# Patient Record
Sex: Male | Born: 1940 | Race: White | Hispanic: No | Marital: Married | Smoking: Former smoker
Health system: Southern US, Community
[De-identification: ages and names within clinical notes are randomized; demographics above are authoritative.]

## PROBLEM LIST (undated history)

## (undated) DIAGNOSIS — I639 Cerebral infarction, unspecified: Secondary | ICD-10-CM

## (undated) DIAGNOSIS — E785 Hyperlipidemia, unspecified: Secondary | ICD-10-CM

## (undated) DIAGNOSIS — N401 Enlarged prostate with lower urinary tract symptoms: Secondary | ICD-10-CM

## (undated) DIAGNOSIS — I2510 Atherosclerotic heart disease of native coronary artery without angina pectoris: Secondary | ICD-10-CM

## (undated) DIAGNOSIS — Z8719 Personal history of other diseases of the digestive system: Secondary | ICD-10-CM

## (undated) DIAGNOSIS — G20 Parkinson's disease: Secondary | ICD-10-CM

## (undated) DIAGNOSIS — D649 Anemia, unspecified: Secondary | ICD-10-CM

## (undated) DIAGNOSIS — K219 Gastro-esophageal reflux disease without esophagitis: Secondary | ICD-10-CM

## (undated) DIAGNOSIS — G20A1 Parkinson's disease without dyskinesia, without mention of fluctuations: Secondary | ICD-10-CM

## (undated) DIAGNOSIS — I10 Essential (primary) hypertension: Secondary | ICD-10-CM

## (undated) HISTORY — PX: EYE SURGERY: SHX253

## (undated) HISTORY — PX: CHOLECYSTECTOMY: SHX55

## (undated) HISTORY — PX: CARDIAC CATHETERIZATION: SHX172

## (undated) HISTORY — PX: OTHER SURGICAL HISTORY: SHX169

---
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
## 1998-02-26 HISTORY — PX: OTHER SURGICAL HISTORY: SHX169

## 2000-02-27 HISTORY — PX: BACK SURGERY: SHX140

## 2003-02-27 ENCOUNTER — Other Ambulatory Visit: Payer: Self-pay

## 2003-02-28 ENCOUNTER — Other Ambulatory Visit: Payer: Self-pay

## 2003-03-01 ENCOUNTER — Other Ambulatory Visit: Payer: Self-pay

## 2005-07-25 ENCOUNTER — Ambulatory Visit: Payer: Self-pay | Admitting: Unknown Physician Specialty

## 2005-10-16 ENCOUNTER — Ambulatory Visit: Payer: Self-pay | Admitting: Family Medicine

## 2005-10-19 ENCOUNTER — Ambulatory Visit: Payer: Self-pay | Admitting: Surgery

## 2005-10-26 ENCOUNTER — Ambulatory Visit: Payer: Self-pay | Admitting: Surgery

## 2005-11-01 ENCOUNTER — Ambulatory Visit: Payer: Self-pay | Admitting: Surgery

## 2006-05-01 ENCOUNTER — Ambulatory Visit: Payer: Self-pay | Admitting: Unknown Physician Specialty

## 2006-05-16 ENCOUNTER — Ambulatory Visit: Payer: Self-pay | Admitting: Unknown Physician Specialty

## 2006-08-16 ENCOUNTER — Emergency Department: Payer: Self-pay | Admitting: Emergency Medicine

## 2006-09-11 ENCOUNTER — Ambulatory Visit: Payer: Self-pay | Admitting: Urology

## 2007-08-26 ENCOUNTER — Emergency Department: Payer: Self-pay | Admitting: Internal Medicine

## 2007-08-26 ENCOUNTER — Other Ambulatory Visit: Payer: Self-pay

## 2008-03-07 IMAGING — CR DG CHEST 2V
1 series · 2 of 2 positions shown · non-contrast
Comparison: none

REASON FOR EXAM: Hypertension
COMMENTS:

[Series 1: view not recorded · 0.17mm/px · 2 of 2 slices shown]
[im 1/2]
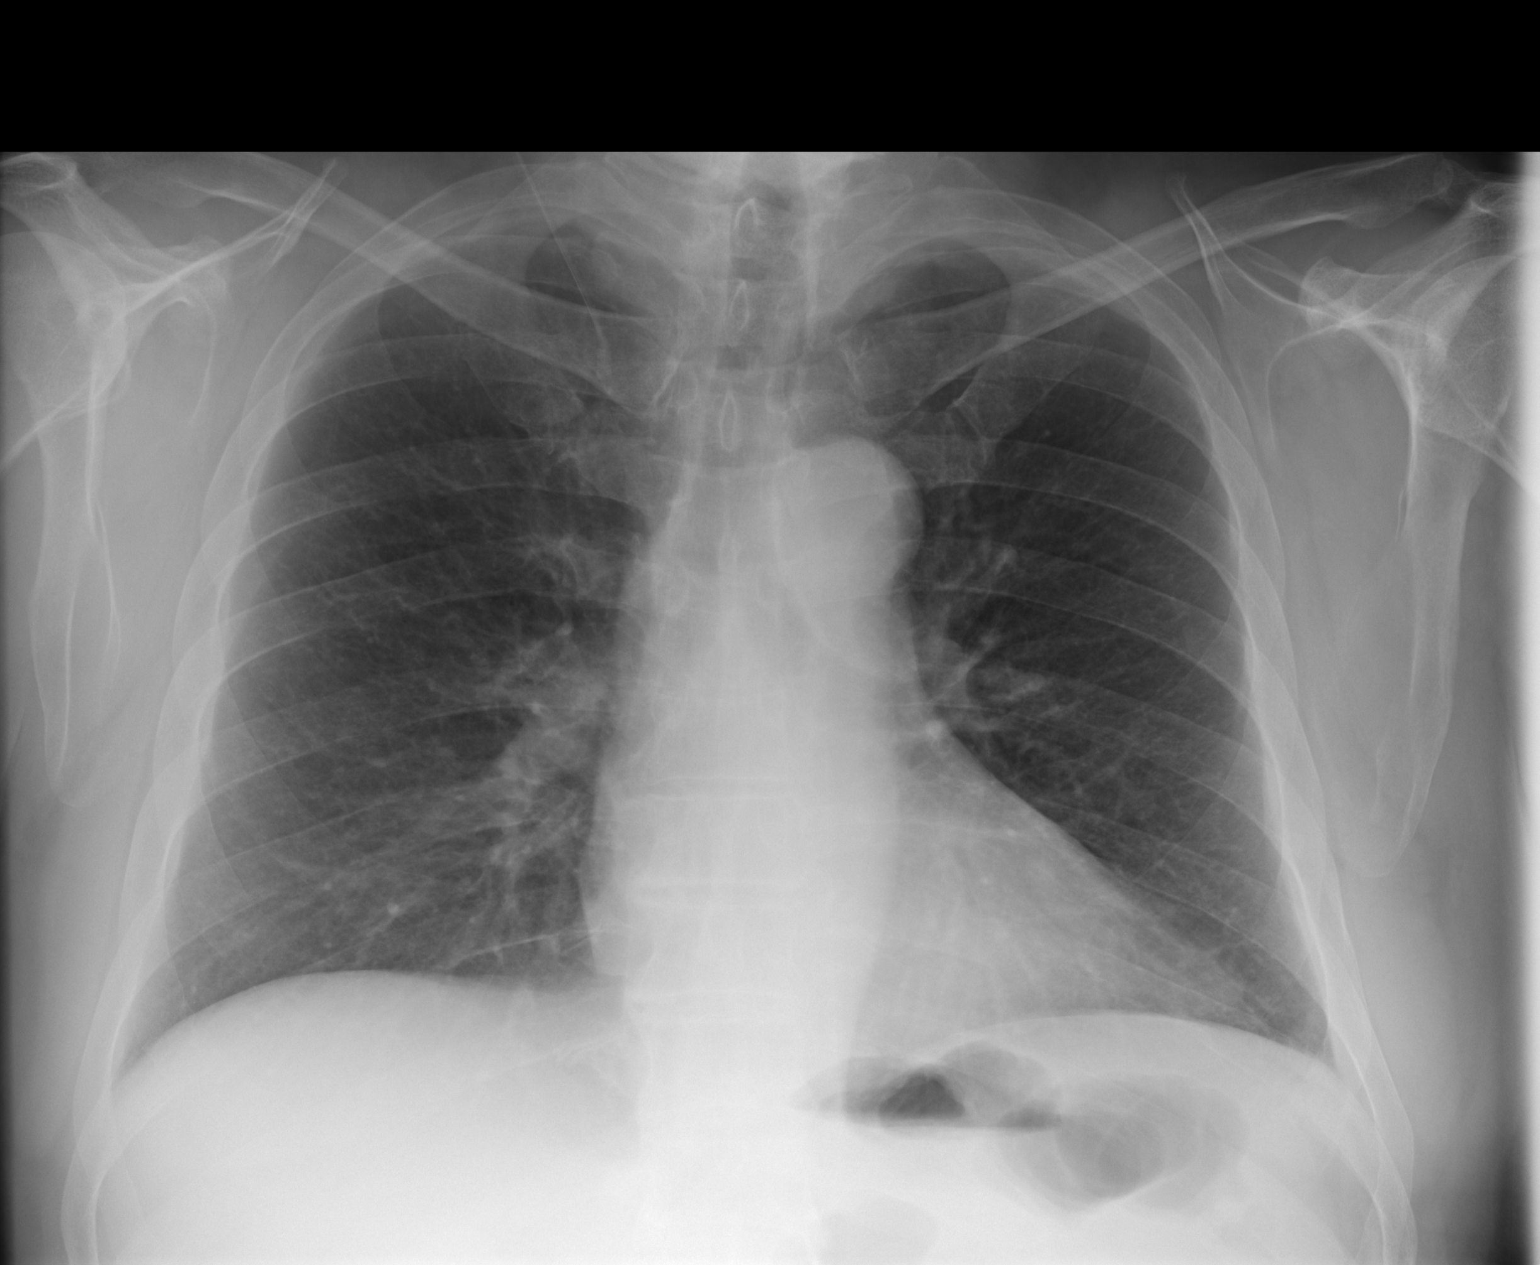
[im 2/2]
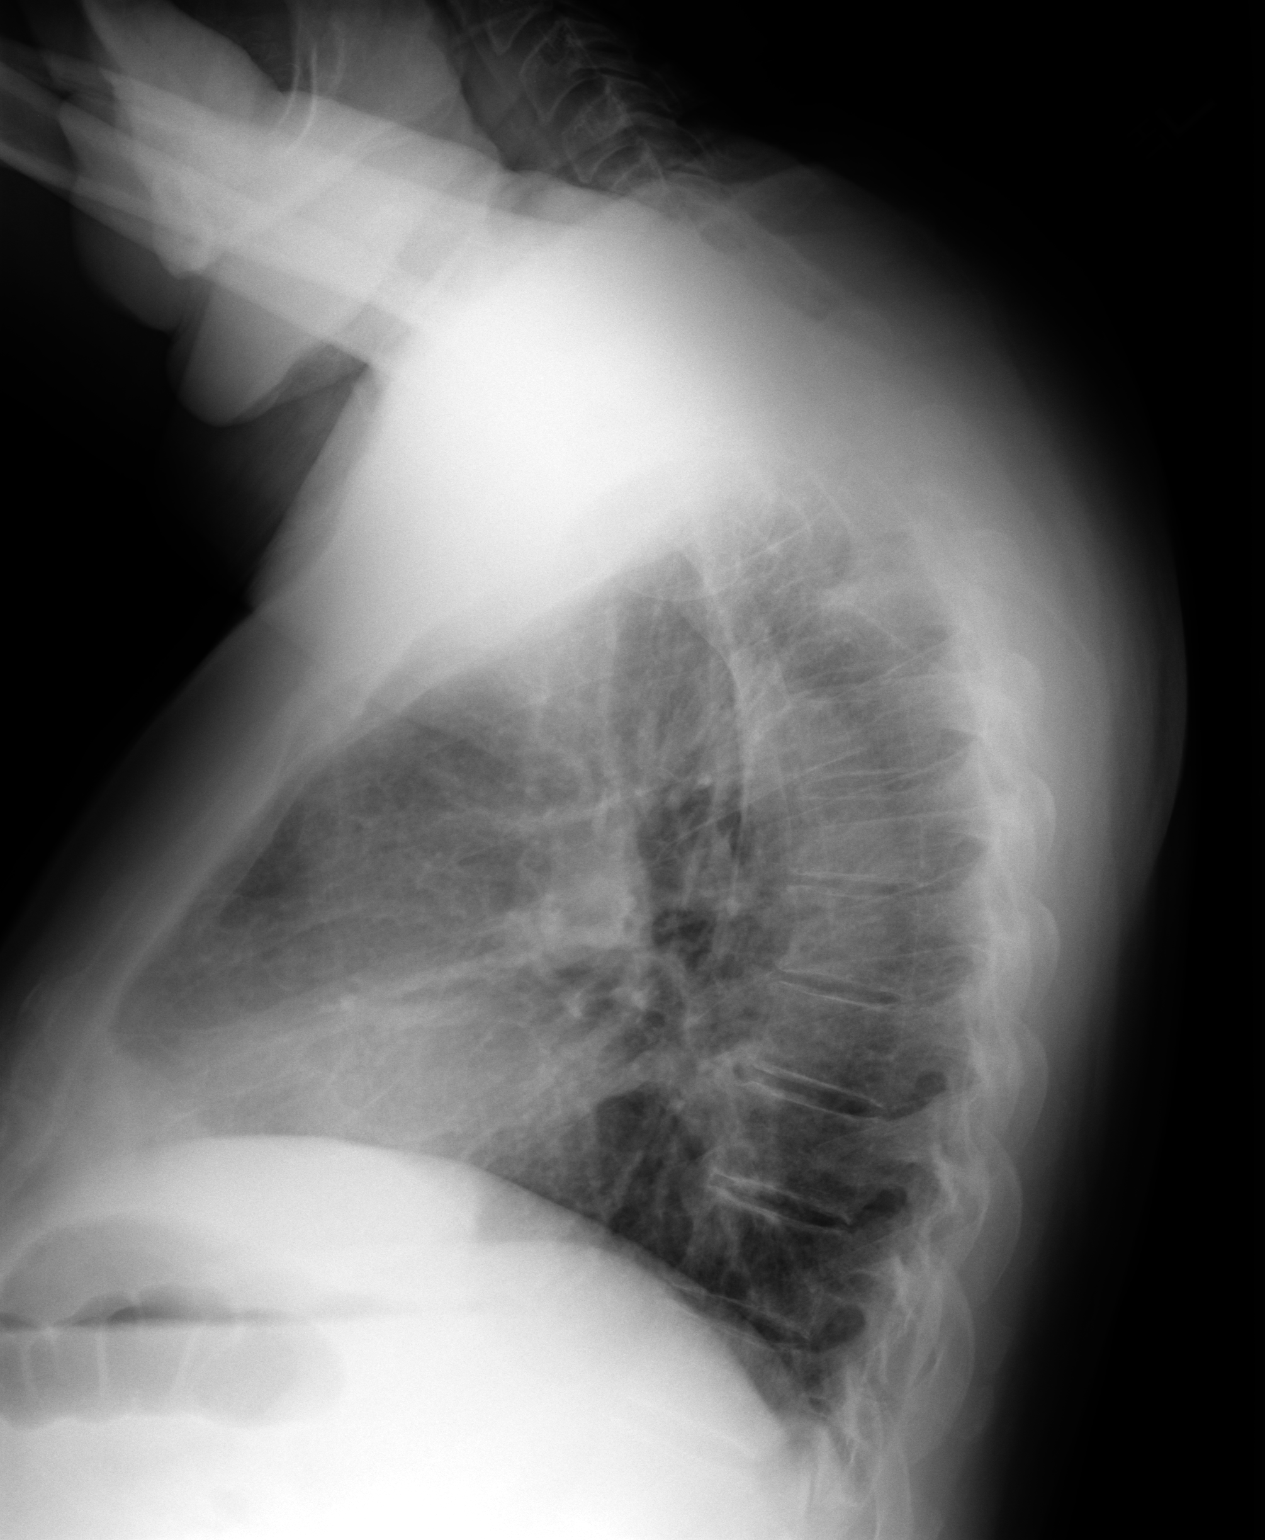

[2 of 2 positions shown; findings below may reference images not displayed]

PROCEDURE:     DXR - DXR CHEST PA (OR AP) AND LATERAL  - October 26, 2005  [DATE]

RESULT:     Two views of the chest show the presence of some minimal
atelectasis on the lateral view in the area of the RIGHT middle lobe or
lingula which is not definitely demonstrated on the PA view. I suspect this
is RIGHT middle lobe in location. The lungs are otherwise clear and fully
inflated. The bony structures are unremarkable. The cardiac silhouette is
normal.
IMPRESSION: Minimal basilar atelectasis or fibrosis presumably in the
RIGHT middle lobe.

## 2008-08-31 ENCOUNTER — Ambulatory Visit: Payer: Self-pay | Admitting: Urology

## 2009-01-18 ENCOUNTER — Observation Stay: Payer: Self-pay | Admitting: Internal Medicine

## 2009-04-25 ENCOUNTER — Ambulatory Visit: Payer: Self-pay | Admitting: Urology

## 2009-05-02 ENCOUNTER — Ambulatory Visit: Payer: Self-pay | Admitting: Urology

## 2010-12-11 ENCOUNTER — Ambulatory Visit: Payer: Self-pay | Admitting: Unknown Physician Specialty

## 2010-12-12 LAB — PATHOLOGY REPORT

## 2011-05-08 ENCOUNTER — Observation Stay: Payer: Self-pay | Admitting: Internal Medicine

## 2011-05-08 LAB — CBC
HCT: 53 % — ABNORMAL HIGH (ref 40.0–52.0)
HGB: 17.7 g/dL (ref 13.0–18.0)
MCH: 30.2 pg (ref 26.0–34.0)
MCHC: 33.5 g/dL (ref 32.0–36.0)
MCV: 90 fL (ref 80–100)
RBC: 5.88 10*6/uL (ref 4.40–5.90)

## 2011-05-08 LAB — COMPREHENSIVE METABOLIC PANEL
Albumin: 3.7 g/dL (ref 3.4–5.0)
Alkaline Phosphatase: 84 U/L (ref 50–136)
BUN: 35 mg/dL — ABNORMAL HIGH (ref 7–18)
Bilirubin,Total: 0.9 mg/dL (ref 0.2–1.0)
Calcium, Total: 8.3 mg/dL — ABNORMAL LOW (ref 8.5–10.1)
Creatinine: 1.42 mg/dL — ABNORMAL HIGH (ref 0.60–1.30)
SGOT(AST): 47 U/L — ABNORMAL HIGH (ref 15–37)
SGPT (ALT): 38 U/L
Total Protein: 8.1 g/dL (ref 6.4–8.2)

## 2011-05-08 LAB — TROPONIN I: Troponin-I: <0.02 ng/mL

## 2011-05-08 LAB — CK TOTAL AND CKMB (NOT AT ARMC): CK-MB: 0.6 ng/mL (ref 0.5–3.6)

## 2011-05-09 LAB — CK TOTAL AND CKMB (NOT AT ARMC)
CK, Total: 79 U/L (ref 35–232)
CK, Total: 79 U/L (ref 35–232)
CK-MB: 0.7 ng/mL (ref 0.5–3.6)

## 2011-05-09 LAB — URINALYSIS, COMPLETE
Bacteria: NONE SEEN
Bilirubin,UR: NEGATIVE
Blood: NEGATIVE
Glucose,UR: NEGATIVE mg/dL (ref 0–75)
Granular Cast: 4
Hyaline Cast: 1
Ketone: NEGATIVE
Specific Gravity: 1.026 (ref 1.003–1.030)
Squamous Epithelial: <1
WBC UR: 3 /HPF (ref 0–5)

## 2011-05-09 LAB — CBC WITH DIFFERENTIAL/PLATELET
Basophil #: 0 10*3/uL (ref 0.0–0.1)
Eosinophil #: 0.1 10*3/uL (ref 0.0–0.7)
Lymphocyte #: 1 10*3/uL (ref 1.0–3.6)
Lymphocyte %: 16.5 %
MCHC: 33.6 g/dL (ref 32.0–36.0)
MCV: 90 fL (ref 80–100)
Monocyte #: 1.1 10*3/uL — ABNORMAL HIGH (ref 0.0–0.7)
Monocyte %: 17.8 %
Neutrophil #: 4 10*3/uL (ref 1.4–6.5)
Platelet: 156 10*3/uL (ref 150–440)
RBC: 5.27 10*6/uL (ref 4.40–5.90)
RDW: 14 % (ref 11.5–14.5)
WBC: 6.2 10*3/uL (ref 3.8–10.6)

## 2011-05-09 LAB — BASIC METABOLIC PANEL
BUN: 35 mg/dL — ABNORMAL HIGH (ref 7–18)
Creatinine: 1.48 mg/dL — ABNORMAL HIGH (ref 0.60–1.30)
EGFR (Non-African Amer.): 50 — ABNORMAL LOW
Glucose: 113 mg/dL — ABNORMAL HIGH (ref 65–99)
Osmolality: 281 (ref 275–301)
Potassium: 3.8 mmol/L (ref 3.5–5.1)
Sodium: 136 mmol/L (ref 136–145)

## 2011-05-09 LAB — TROPONIN I: Troponin-I: <0.02 ng/mL

## 2011-05-09 LAB — CLOSTRIDIUM DIFFICILE BY PCR

## 2011-05-09 LAB — WBCS, STOOL

## 2011-05-10 LAB — BASIC METABOLIC PANEL
Anion Gap: 10 (ref 7–16)
BUN: 20 mg/dL — ABNORMAL HIGH (ref 7–18)
Calcium, Total: 7.6 mg/dL — ABNORMAL LOW (ref 8.5–10.1)
Chloride: 109 mmol/L — ABNORMAL HIGH (ref 98–107)
Co2: 19 mmol/L — ABNORMAL LOW (ref 21–32)
Glucose: 87 mg/dL (ref 65–99)
Potassium: 3.9 mmol/L (ref 3.5–5.1)
Sodium: 138 mmol/L (ref 136–145)

## 2011-09-03 ENCOUNTER — Ambulatory Visit: Payer: Self-pay | Admitting: Gastroenterology

## 2012-04-06 ENCOUNTER — Inpatient Hospital Stay: Payer: Self-pay | Admitting: Internal Medicine

## 2012-04-06 ENCOUNTER — Ambulatory Visit: Payer: Self-pay | Admitting: Neurology

## 2012-04-06 LAB — COMPREHENSIVE METABOLIC PANEL
Albumin: 3.5 g/dL (ref 3.4–5.0)
Alkaline Phosphatase: 87 U/L (ref 50–136)
Anion Gap: 6 — ABNORMAL LOW (ref 7–16)
BUN: 14 mg/dL (ref 7–18)
Calcium, Total: 8.1 mg/dL — ABNORMAL LOW (ref 8.5–10.1)
Co2: 27 mmol/L (ref 21–32)
Creatinine: 1.07 mg/dL (ref 0.60–1.30)
EGFR (African American): >60
Glucose: 89 mg/dL (ref 65–99)
Osmolality: 279 (ref 275–301)
Potassium: 3.6 mmol/L (ref 3.5–5.1)
SGOT(AST): 23 U/L (ref 15–37)
SGPT (ALT): 29 U/L (ref 12–78)

## 2012-04-06 LAB — CK TOTAL AND CKMB (NOT AT ARMC): CK-MB: 0.9 ng/mL (ref 0.5–3.6)

## 2012-04-06 LAB — CBC
HCT: 46.4 % (ref 40.0–52.0)
HGB: 15.8 g/dL (ref 13.0–18.0)
MCH: 30.1 pg (ref 26.0–34.0)
MCHC: 34.1 g/dL (ref 32.0–36.0)
MCV: 88 fL (ref 80–100)
Platelet: 168 10*3/uL (ref 150–440)
RDW: 13.9 % (ref 11.5–14.5)
WBC: 6.3 10*3/uL (ref 3.8–10.6)

## 2012-04-06 LAB — URINALYSIS, COMPLETE
Blood: NEGATIVE
Glucose,UR: NEGATIVE mg/dL (ref 0–75)
Ketone: NEGATIVE
Nitrite: NEGATIVE
Ph: 7 (ref 4.5–8.0)
Protein: NEGATIVE
Specific Gravity: 1.01 (ref 1.003–1.030)
WBC UR: 1 /HPF (ref 0–5)

## 2012-04-07 LAB — CBC WITH DIFFERENTIAL/PLATELET
Basophil #: 0 10*3/uL (ref 0.0–0.1)
Basophil %: 0.3 %
Eosinophil #: 0.1 10*3/uL (ref 0.0–0.7)
HCT: 43.9 % (ref 40.0–52.0)
Lymphocyte #: 1.4 10*3/uL (ref 1.0–3.6)
Lymphocyte %: 22.4 %
MCH: 29.3 pg (ref 26.0–34.0)
MCHC: 32.9 g/dL (ref 32.0–36.0)
Monocyte #: 0.8 x10 3/mm (ref 0.2–1.0)
Neutrophil #: 3.9 10*3/uL (ref 1.4–6.5)
WBC: 6.2 10*3/uL (ref 3.8–10.6)

## 2012-04-07 LAB — COMPREHENSIVE METABOLIC PANEL
Albumin: 3.2 g/dL — ABNORMAL LOW (ref 3.4–5.0)
Anion Gap: 6 — ABNORMAL LOW (ref 7–16)
Bilirubin,Total: 0.8 mg/dL (ref 0.2–1.0)
Chloride: 108 mmol/L — ABNORMAL HIGH (ref 98–107)
Co2: 25 mmol/L (ref 21–32)
Creatinine: 1.06 mg/dL (ref 0.60–1.30)
EGFR (Non-African Amer.): >60
SGOT(AST): 24 U/L (ref 15–37)

## 2012-04-07 LAB — PHENYTOIN LEVEL, TOTAL: Dilantin: 10.7 ug/mL (ref 10.0–20.0)

## 2012-04-09 LAB — BASIC METABOLIC PANEL
Anion Gap: 6 — ABNORMAL LOW (ref 7–16)
BUN: 13 mg/dL (ref 7–18)
Calcium, Total: 8.1 mg/dL — ABNORMAL LOW (ref 8.5–10.1)
Creatinine: 0.99 mg/dL (ref 0.60–1.30)
EGFR (African American): >60
EGFR (Non-African Amer.): >60
Glucose: 84 mg/dL (ref 65–99)
Osmolality: 279 (ref 275–301)
Potassium: 3.8 mmol/L (ref 3.5–5.1)
Sodium: 140 mmol/L (ref 136–145)

## 2012-04-09 LAB — PHENYTOIN LEVEL, TOTAL: Dilantin: 8.1 ug/mL — ABNORMAL LOW (ref 10.0–20.0)

## 2012-04-09 LAB — CBC WITH DIFFERENTIAL/PLATELET
Basophil #: 0 10*3/uL (ref 0.0–0.1)
Basophil %: 0.6 %
Eosinophil %: 2.6 %
HCT: 44.3 % (ref 40.0–52.0)
HGB: 14.7 g/dL (ref 13.0–18.0)
Lymphocyte #: 1.2 10*3/uL (ref 1.0–3.6)
MCH: 29.8 pg (ref 26.0–34.0)
MCHC: 33.2 g/dL (ref 32.0–36.0)
Monocyte #: 0.7 x10 3/mm (ref 0.2–1.0)
Neutrophil #: 5.1 10*3/uL (ref 1.4–6.5)
RBC: 4.93 10*6/uL (ref 4.40–5.90)
RDW: 14.2 % (ref 11.5–14.5)

## 2012-04-12 DIAGNOSIS — R569 Unspecified convulsions: Secondary | ICD-10-CM | POA: Insufficient documentation

## 2012-04-12 LAB — CULTURE, BLOOD (SINGLE)

## 2012-04-23 ENCOUNTER — Ambulatory Visit: Payer: Self-pay | Admitting: Ophthalmology

## 2013-05-06 ENCOUNTER — Ambulatory Visit: Payer: Self-pay | Admitting: Ophthalmology

## 2014-03-24 ENCOUNTER — Ambulatory Visit: Payer: Self-pay | Admitting: Internal Medicine

## 2014-06-18 NOTE — Consult Note (Signed)
PATIENT NAME:  Jeffery Strong, [name]#:  [id number] DATE OF BIRTH:  11-05-1940  DATE OF CONSULTATION:  04/06/2012  CONSULTING PHYSICIAN:  Pauletta BrownsYuriy Dionisio Aragones, MD  REASON FOR CONSULTATION: ER consultation for altered mental status, questionable seizures.   HISTORY OF PRESENT ILLNESS: This is 74 year old male with only significant past medical history of hypertension, possible hyperlipidemia, coronary artery disease, status post stenting, ? hx of Parkinson's dz comes into the Emergency Room with transient periods of confusion. Information is obtained from a family member, who is his wife who is at bedside. She states that the patient in church started having these frequent episodes of right upper extremity shaking with transient periods of confusion, disorientation and not able to expressive herself consistent with expressive aphasia.  The symptoms come and go and are associated with right upper extremity shaking.  This is the first time of such symptoms. No reported prior history of stroke.  No reported prior history of seizure. No tongue biting, no urinary incontinence at this point. When I was in the room I noticed forced field deviation to R side and RUE shaking.   ROS: General: fatigue, confusion.  HEENT: headache REsp: no SOB CV: No CP GI: no diarhea or constipation Heme: no cold/heat intolerance.   PAST MEDICAL HISTORY: As described above, hypertension, hyperlipidemia, and coronary artery disease and ? hx of Parksions dz  MEDICATIONS:  As per family, he is on aspirin 325 a day.   REVIEW OF SYSTEMS: Cannot be obtained at this time as the patient is not able to express himself.   CURRENT NEUROLOGICAL EXAMINATION:  MENTAL STATUS: The patient is awake, appears to have expressive aphasia, not able to tell me day or time.  CRANIAL NERVE EXAMINATION: Extraocular movements appear to be intact. Facial sense, facial motor appear to be intact. Unable to assess all the cranial nerves.  The patient withdraws  from painful stimuli bilateral symmetrically. Reflexes appeared to be symmetrical bilaterally.   IMPRESSION: The patient is a 74 year old male with past medical history as above, appears to have partial seizures. He appears to be in partial status with occasional right field deviation of his eyes to the right as well as shaking of the right upper extremity, and that is associated with expressive aphasia as most likely the focus of the seizures is Broca's left frontal lobe area. The case was discussed with the Emergency Department. At this time, load the patient with Keppra 1 gram as well as start 1 gram b.i.d. as well as load with Dilantin 20mg /kg.  He is about 100 kilos, so 2 grams now, start him on dilantin 100 mg t.i.d. Monitor closely.  If the patient continues to be shaking and is suspected to have seizure activity, I would start him on benzodiazepine drip.  At that time, the patient will need to be intubated.  I agree with admitting the patient to the ICU for observation.  Unfortunately, at this time we are unable to obtain EEG monitoring but would treat as the patient is in status. MRI of the brain when possible with and without contrast.   - EEG AM - sz precaustions -MRI Brain w/ wout gad in AM - Anti epileptics as above.  -Obtain diantin level in AM - If suspect sz activity, pt suddenly has change in mental status with negative CT [facility].   ____________________________ Pauletta BrownsYuriy Bodie Abernethy, MD yz:cb D: 04/06/2012 13:47:50 ET T: 04/06/2012 14:46:24 ET JOB#: [id number]  cc: Pauletta Browns, MD, <Dictator> Pauletta Browns MD ELECTRONICALLY SIGNED 04/06/2012 18:23

## 2014-06-18 NOTE — Discharge Summary (Signed)
PATIENT NAME:  Jeffery Strong, Jeffery Strong MR#:  [id number] DATE OF BIRTH:  March 16, 1940  DATE OF ADMISSION:  04/06/2012 DATE OF DISCHARGE: 04/10/2012   ADMITTING PHYSICIAN: Duane Lope. Judithann Sheen, MD  DISCHARGING PHYSICIAN: Enid Baas, MD  PRIMARY CARE PHYSICIAN: Jorje Guild. Beckey Downing, MD  CONSULTATIONS IN THE HOSPITAL: Neurology consultation by Theora Master.  FINAL DISCHARGE DIAGNOSES:  1. Status epilepticus.  2. Ongoing partial complex seizures.  3. Parkinson's disease.  4. Hypertension.  5. Coronary artery disease.  6. Hyperlipidemia.   DISCHARGE MEDICATIONS:  1. Azilect 1 mg p.o. daily.  2. Aspirin 3 mg p.o. daily.  3. Atorvastatin 20 mg p.o. daily.  4. Xanax 0.25 mg twice a week as needed for anxiety.  5. Fosphenytoin 200 mg IV t.i.d.   6. Keppra 1 gram IV q.12 hours.  7. Ativan 1 to 2 mg IV q.4 hours as needed for seizures.   DISCHARGE DIET: Low-sodium diet.   DISCHARGE ACTIVITY: As tolerated.    FOLLOWUP INSTRUCTIONS: The patient is being transferred to [facility].   LABS AND IMAGING STUDIES: At this time:  1. WBC 7.2, hemoglobin 14.7, hematocrit 44.3, platelet count 150.  2. Sodium 140, potassium 3.8, chloride 110, bicarbonate 24, BUN 13, creatinine 0.99, glucose 84 and calcium of 8.1.  3. Serum Dilantin level was 8.1, after which the Dilantin bolus was given and dose increased. 4. MRI of the brain from 04/07/2012: This is MRI with and without contrast, and it is showing changes of atrophy and chronic microvascular ischemic disease, no acute intracranial abnormality is done.  5. Ultrasound of carotids bilaterally showing no evidence of hemodynamically significant stenosis. 6. Blood cultures done on admission on 04/06/2012 are negative.  7. Urinalysis negative for any infection.  8. CT of head done on admission showing no acute intracranial process.   BRIEF HOSPITAL COURSE: The patient is a very pleasant 74 year old Caucasian male with past medical history significant for Parkinson's  disease, coronary artery disease, hypertension and hyperlipidemia, who was [place] in from church secondary to recurrent episodes of near-syncope with aphasia, after which he was confused. He was seen by neurologist on call and was admitted initially for status epilepticus and possible ongoing seizures.  Status epilepticus with possible  ongoing seizure. He was initially admitted to CCU for possible ongoing seizures. Was loaded with Dilantin, and then Keppra was added. EEG was negative for any ictal or interictal activity. Carotid ultrasound and CT of head were negative. MRI was done with and without contrast, which did not show any acute intracranial abnormalities. He was gradually moved out of CCU, but the patient continued to remain confused, which was thought to be possible postictal confusion. He was on Azilect for several years for his Parkinson's disease, which could reduce the seizure threshold, so the Azilect was held in the hospital for 1 day. The patient continued to have 1- to 3-minute episodes of upper body jerking, mostly his right arm, followed by confusion. During these episodes, the patient has like stared look, and he is confused after the episode is halted with Ativan. Then, it was taking a few hours before he could get back to normal. Over the last 24 hours, the patient almost had 6 to 7 episodes of this, of which 3 of them required Ativan as they have lasted more than 2 minutes. This morning, the patient alert and oriented, but according to wife, he still is having episodes of confusion. At this time, because our facility lacks ongoing continuous neurological care and also our facility  lacks continuous neurological monitoring and also no 24-hour EEG availability, I spoke with Dr. Luvenia HellerMichael Wang, neurologist at [facility], who has kindly accepted transfer of the patient over there for further monitoring or further testing. His heart disease and hypertension are stable. His vitals have been stable. His  labs as of 04/09/2012 have been dictated above.   DISCHARGE CONDITION: Guarded.   DISCHARGE DISPOSITION: To [facility].   TIME SPENT ON DISCHARGE: 40 minutes.    ____________________________ Enid Baasadhika Ab Leaming, MD rk:OSi D: 04/10/2012 13:12:20 ET T: 04/10/2012 13:55:15 ET JOB#: [id number]  cc: Enid Baasadhika Doil Kamara, MD, <Dictator> Enid BaasADHIKA  Shellhammer MD ELECTRONICALLY SIGNED 04/24/2012 15:18

## 2014-06-18 NOTE — Discharge Summary (Signed)
PATIENT NAME:  Jeffery Strong, [name] MR#:  [id number] DATE OF BIRTH:  [birth year]  DATE OF ADMISSION:  04/06/2012 DATE OF DISCHARGE:  04/11/2012  ADDENDUM  ADMITTING PHYSICIAN: Dr. Aram BeechamJeffrey Sparks.   DISCHARGING PHYSICIAN: Enid Baasadhika Quay Simkin, MD  For more details, please look at the discharge summary done by Dr. Nemiah CommanderKalisetti on 04/10/2012.   ADDENDUM: The [facility]10/2012 but because of extremely bad weather, the EMS was not able to transport him on February 13th so the [facility] is [facility]. The patient did have at least 3 to 4 seizures, generalized tonic-clonic seizures, lasting greater than 3 minutes requiring 2 to 4 mg of IV Ativan this morning and also last night. [facility] as we cannot provide continuous EEG monitoring and do not have full time Neurology services available at this hospital.  ADDITIONAL TIME SPENT: 35 minutes on coordinating the discharge today.    ____________________________ Enid Baasadhika Ketra Duchesne, MD rk:jm D: 04/11/2012 16:05:58 ET T: 04/12/2012 10:44:36 ET JOB#: [id number]  cc: Enid Baasadhika Lashia Niese, MD, <Dictator> Enid BaasADHIKA Neeley Sedivy MD ELECTRONICALLY SIGNED 04/23/2012 15:58

## 2014-06-18 NOTE — Consult Note (Signed)
Referring Physician:  Idelle Crouch :   Primary Care Physician:  Darol Destine, [address], [place], [place] [address], [place] : [facility], [address] [place],  [address], [place] [phone]  Reason for Consult: Admit Date: 06-Apr-2012  Chief Complaint: Seizures  Reason for Consult: seizure   History of Present Illness: History of Present Illness:   HISTORY OF PRESENT ILLNESS: man with a history of PD, HTN, HLP and CAD was admitted to [facility] after having recurrent episodes of altered mental status and aphasia while at church on Feb 9th.  The patient was thought to be in simple partial status with repetitive shaking and eye deviation noted.  He was given IV ativan, fosphenytoin and Keppra in the ED and his symptoms resolved.  There have been no seizures since that time.  No side effects on the AEDs.  The symptoms came on abruptly without warning.  Only the medication made them better.  They were severe when they were present.  Nothing in particular brought them on.  EEG was negative for ictal vs interictal activity.  HCT was unremarkable.  Carotid ultrasound unremarkable.  The patient's mental status has improved and he is now alert but he is feeling generally weak.  His PD has been under adequate control with his Azilect.  says that he continues to act abnormally though.  He was confusing some family members for other family members.  He has been uncharacteristically abrasive and irascible.  He is refusing to speak with some providers and he got upset with a nurse earlier resulting in a full time sitter.  Per family this is all abnormal behavior for him.  He has been afebrile. MEDICAL HISTORY: 1. Parkinson's disease.  ASCVD, status post PTCA with stent placement.  BPH.  History of prostatitis.  Benign hypertension.  Hyperlipidemia.  Previous back surgery.  Status post cholecystectomy.   1. Toprol-XL 50 mg p.o. daily.   Azilect 1 mg p.o. daily.  Lipitor 20 mg p.o. at bedtime.  Aspirin 325 mg p.o. daily.  Xanax 0.25 mg p.o. q. 6 hours as needed for anxiety.  No known drug allergies.  HISTORY: The patient has a remote history of tobacco abuse but none recently. Denies alcohol abuse.  HISTORY: Positive for diabetes, hypertension, stroke, and coronary artery disease. Negative for prostate or colon cancer.   GENERAL: Uncooperative.  Says he doesnt want to be examined, despite explaining that it is very important for his health.  Alert and speaks in complete sentences says he "doesn't want to be bothered".   Patient refuses this portion of the exam.  Patient refuses this portion of the exam. - Normal- Patient refuses this portion of the exam. Pronator Drift - Patient refuses this portion of the exam. Ambulation - Patient refuses this portion of the exam. Romberg - Patient refuses this portion of the exam.- Patient refuses this portion of the exam. STATUS: Patient refuses this portion of the exam.  He reluctantly says that he's from [facility] which is correct. NERVES: Patient refuses this portion of the exam.  Patient refuses this portion of the exam.  Patient refuses this portion of the exam.  Patient refuses this portion of the exam.  74 year old man with a history of PD, HTN, HLP and CAD was admitted to [facility] after having recurrent episodes of altered mental status and aphasia while at church on Feb 9th.  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  Based on this, I would recommend that he stay on the Keppra 1g twice a day if no adverse effects are noted.  It is uncertain if his behavior change is the result of the Keppra or post ictal psychosis.  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  He should be taken off of the dilantin.  He should have a 72 hour ambulatory EEG as an outpatient to  evaluate for ictal vs interictal activity to try to better characterize and localize his possible seizure disorder.  It should be noted that in patients with epilpesy, a routine EEG is only positive 40% of the time.  I have personally viewed his HCT which is unremarkable.  I have personally viewed his EEG which is negative for interictal activity.  I would not recommend a repeat HCT or MRI at this point given that he does not have any definite focal deficits on exam although my interacdtion as outlined above has been limited.  He should be monitored on telemetry to rule out afib.  His EKG was unremarkable.  Carotid ultrasound was negative for carotid artery stenosis.  He should stay on ASA 325 mg daily.    With regards to his parkinsons disease, I have noted that he is on Azilect which occasionally in patients with predisposition, can lower the seizure threshold, although I do not think this medication "caused" his seizure it could have in part contributed, although this is not certain.  Since he has been tolerating the medication for a long time, I would not recommend stopping it at this point unless his mood does not improve spontaneously.  At this point, I would give him another 24 hours or so to see if his post ictal psychosis resolves.   will note that the family is very concerned about his new event and change in behavior and they are wondering if this may represent ongoing seizures.  I have explained to them that post ictal psychosis is common and he appears to be experiencing this.  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  I have explained to the family that I cannot 100% rule out ongoing subclinical seizures and that if they feel the need to pursue this level of  coverage then they may require transfer to a tertiary facility with this equipment like [facility] or [facility].  I have explained that I do not particularly think this is necessary at this point, as I think his psychosis will wear off over the next 24-48 hours but I can understand their concern given the recent seizures and postictal personality change are big changes from his baseline and are certainly very anxiety provoking in the family.    Also, we have discussed that fMRI is not indicated for PD and is considered experimental for diagnostic purposes.  I have explained that this is a test that would not be performed on an inpatient, especially an inpatient with clear-cut, clinically diagnosed PD. Melrose Nakayama, MD    ROS:  General denies complaints   HEENT no complaints   Lungs no complaints   Cardiac no complaints   GI no complaints   GU no complaints   Musculoskeletal no complaints   Extremities  no complaints   Skin no complaints   Endocrine no complaints   Past Medical/Surgical Hx:  Parkinson's Disease:   postratisis:   HTN:   High cholesterol:   GB Removed:   Back surgery:   Cardiac Stent:   Home Medications: Medication Instructions Last Modified Date/Time  Azilect 1 mg oral tablet 1 tab(s) orally once a day 09-Feb-14 15:09  aspirin 325 mg oral tablet 1 tab(s) orally once a day 09-Feb-14 15:09  atorvastatin 20 mg oral tablet 1 tab(s) orally once a day (at bedtime) 09-Feb-14 15:09  Metoprolol Succinate ER 50 mg oral tablet, extended release 1 tab(s) orally once a day 09-Feb-14 15:09  alprazolam 0.25 mg oral tablet 1 tab(s) orally 2 times a week as needed for anxiety, nervousness.  09-Feb-14 15:09   [facility] Current Meds:  Sodium Chloride 0.9%, 1000 ml at 100 ml/hr  Aspirin Enteric Coated tablet, ( Ecotrin)  325 mg Oral daily  - Indication: Pain/Fever/Thromboembolic Disorders/Post MI/Prophylaxis MI  Instructions:  Initiate Bleeding Precautions Protocol--DO NOT  CRUSH  atorvaSTATin tablet, 20 mg Oral at bedtime  - Indication: Hypercholesterolemia  meTOProlol succinate ER tablet, ( Toprol XL)  50 mg Oral daily  - Indication: Antihypertensive/ Angina  Instructions:  - DO NOT CRUSH  Non-Formulary Medication, Azilect  1 mg Oral daily  Instructions:  pt may take own supply.Marland KitchenMarland KitchenPatient's Own Med  LORazepam injection,  ( Ativan injection )  1 to 2 mg, IV push, q4h PRN for agitation  Indication: Anxiety/ Seizure/ Antiemetic Adjunct/ Preop Sedation  Acetaminophen * tablet, ( Tylenol (325 mg) tablet)  650 mg Oral q4h PRN for pain or temp. greater than 100.4  - Indication: Pain/Fever  Pantoprazole injection, ( Protonix injection )  40 mg, IV push, q12h  Indication: Erosive Esophagitis/ GERD, 1. Reconstitute Protonix with 75m of Normal Saline; concentration= 429mmL. Once reconstituted; Protonix must be administered within 30 minutes.  2. I.V. line must be flushed before and after administration. Administer [id number].V. push over 2-3 minutes.  Enoxaparin injection,  ( Lovenox injection )  40 mg, Subcutaneous, daily  Indication: Prophylaxis or treatment of thromboembolic disorders, Monitor Anticoags per hospital protocol  Fosphenytoin injection, ( CereBYX injection )  100 mg/pe, IV push, tid  Indication: Seizures, Dilute each 100 mg PE (2 ml) dose in 2 ml D5W or NS and administer at 100 mg PE per minute.  levETIRAcetam injection,  ( Keppra injection )  1000 mg in Dextrose 5% 100 ml, IV Piggyback, q12h, Infuse over 15 minute(s), Mixed in 100 ml given over 15 minutes.  Allergies:  NKDA: None  Vital Signs: **Vital Signs.:   11-Feb-14 00:00  Vital Signs Type Routine  Pulse Pulse 76  Respirations Respirations 26  Pulse Ox % Pulse Ox % 96  Pulse Ox Activity Level  At rest  Oxygen Delivery Room Air/ 21 %  Pulse Ox Heart Rate 76    01:00  Pulse Pulse 62  Respirations Respirations 37  Systolic BP Systolic BP 129 001astolic BP (mmHg) Diastolic BP  (mmHg) 80  Mean BP 96  Pulse Ox % Pulse Ox % 92  Oxygen Delivery Room Air/ 21 %  Pulse Ox Heart Rate 70    03:13  Vital Signs Type Routine  Pulse Pulse 62  Respirations Respirations 51  Systolic BP Systolic BP 95  Diastolic BP (mmHg) Diastolic BP (mmHg) 61  Mean BP 72  Pulse Ox % Pulse Ox % 95  Pulse Ox Activity Level  At rest  Oxygen Delivery  Room Air/ 21 %    06:00  Vital Signs Type Routine  Temperature Temperature (F) 98.8  Celsius 37.1  Pulse Pulse 62  Pulse Ox % Pulse Ox % 99  Oxygen Delivery Room Air/ 21 %  Pulse Ox Heart Rate 62    07:19  Vital Signs Type POCT  Nurse Fingerstick (mg/dL) FSBS (fasting range 65-99 mg/dL) 133  Comments/Interventions  Nurse Notified    07:35  Vital Signs Type Routine  Temperature Temperature (F) 97.7  Celsius 36.5  Temperature Source oral  Pulse Pulse 61  Respirations Respirations 20  Systolic BP Systolic BP 85  Diastolic BP (mmHg) Diastolic BP (mmHg) 52  Mean BP 63  Pulse Ox % Pulse Ox % 97  Pulse Ox Activity Level  At rest  Oxygen Delivery Room Air/ 21 %  Pulse Ox Heart Rate 64    08:00  Vital Signs Type Routine  Pulse Pulse 56  Respirations Respirations 20  Systolic BP Systolic BP 64  Diastolic BP (mmHg) Diastolic BP (mmHg) 38  Mean BP 46  Pulse Ox % Pulse Ox % 100  Pulse Ox Activity Level  At rest  Oxygen Delivery Room Air/ 21 %  Pulse Ox Heart Rate 58    08:35  Vital Signs Type Routine  Temperature Source oral  Pulse Pulse 58  Systolic BP Systolic BP 92  Diastolic BP (mmHg) Diastolic BP (mmHg) 59  Mean BP 70  Pulse Ox % Pulse Ox % 94  Oxygen Delivery Room Air/ 21 %  Pulse Ox Heart Rate 56    09:00  Vital Signs Type Routine  Pulse Pulse 56  Systolic BP Systolic BP 93  Diastolic BP (mmHg) Diastolic BP (mmHg) 70  Mean BP 77  Pulse Ox % Pulse Ox % 100  Pulse Ox Activity Level  At rest  Oxygen Delivery Room Air/ 21 %  Pulse Ox Heart Rate 58    09:36  Vital Signs Type Routine  Pulse Pulse 58  Systolic BP  Systolic BP 403  Diastolic BP (mmHg) Diastolic BP (mmHg) 70  Mean BP 90  Pulse Ox % Pulse Ox % 99  Pulse Ox Activity Level  At rest  Oxygen Delivery Room Air/ 21 %  Pulse Ox Heart Rate 60   Lab Results:  Hepatic:  09-Feb-14 14:03   Bilirubin, Total 0.6  Alkaline Phosphatase 87  SGPT (ALT) 29  SGOT (AST) 23  Total Protein, Serum 6.8  Albumin, Serum 3.5  10-Feb-14 04:00   Bilirubin, Total 0.8  Alkaline Phosphatase 85  SGPT (ALT) 26  SGOT (AST) 24  Total Protein, Serum  6.3  Albumin, Serum  3.2  TDMs:  10-Feb-14 04:00   Dilantin, Serum 10.7 (Result(s) reported on 07 Apr 2012 at 04:36AM.)  Routine Micro:  09-Feb-14 18:24   Micro Text Report BLOOD CULTURE   COMMENT                   NO GROWTH IN 36 HOURS   ANTIBIOTIC                       Micro Text Report BLOOD CULTURE   COMMENT                   NO GROWTH IN 36 HOURS   ANTIBIOTIC                       Culture Comment NO GROWTH IN 36 HOURS  Result(s)  reported on 08 Apr 2012 at 09:00AM.  Culture Comment NO GROWTH IN 36 HOURS  Result(s) reported on 08 Apr 2012 at 09:00AM.  Lab:  09-Feb-14 15:48   pH (ABG) 7.39  PCO2 42  PO2  74  FiO2 21  Base Excess 0.3  HCO3 25.4  O2 Saturation 95.0  O2 Device ra  Specimen Site (ABG) RT RADIAL  Specimen Type (ABG) ARTERIAL  Patient Temp (ABG) 37.0 (Result(s) reported on 06 Apr 2012 at 03:49PM.)  Cardiology:  09-Feb-14 12:37   Ventricular Rate 79  Atrial Rate 79  P-R Interval 152  QRS Duration 70  QT 360  QTc 412  P Axis -12  R Axis 4  T Axis 20  Routine Chem:  09-Feb-14 14:03   Glucose, Serum 89  BUN 14  Creatinine (comp) 1.07  Sodium, Serum 140  Potassium, Serum 3.6  Chloride, Serum 107  CO2, Serum 27  Calcium (Total), Serum  8.1  Osmolality (calc) 279  eGFR (African American) >60  eGFR (Non-African American) >60 (eGFR values <20m/min/1.73 m2 may be an indication of chronic kidney disease (CKD). Calculated eGFR is useful in patients with stable renal  function. The eGFR calculation will not be reliable in acutely ill patients when serum creatinine is changing rapidly. It is not useful in  patients on dialysis. The eGFR calculation may not be applicable to patients at the low and high extremes of body sizes, pregnant women, and vegetarians.)  Anion Gap  6  10-Feb-14 04:00   Glucose, Serum  113  BUN 14  Creatinine (comp) 1.06  Sodium, Serum 139  Potassium, Serum 3.8  Chloride, Serum  108  CO2, Serum 25  Calcium (Total), Serum  8.0  Osmolality (calc) 279  eGFR (African American) >60  eGFR (Non-African American) >60 (eGFR values <676mmin/1.73 m2 may be an indication of chronic kidney disease (CKD). Calculated eGFR is useful in patients with stable renal function. The eGFR calculation will not be reliable in acutely ill patients when serum creatinine is changing rapidly. It is not useful in  patients on dialysis. The eGFR calculation may not be applicable to patients at the low and high extremes of body sizes, pregnant women, and vegetarians.)  Anion Gap  6  Cardiac:  09-Feb-14 14:03   Troponin I < 0.02 (0.00-0.05 0.05 ng/mL or less: NEGATIVE  Repeat testing in 3-6 hrs  if clinically indicated. >0.05 ng/mL: POTENTIAL  MYOCARDIAL INJURY. Repeat  testing in 3-6 hrs if  clinically indicated. NOTE: An increase or decrease  of 30% or more on serial  testing suggests a  clinically important change)  CK, Total 50  CPK-MB, Serum 0.9 (Result(s) reported on 06 Apr 2012 at 02:40PM.)  Routine UA:  09-Feb-14 13:46   Color (UA) Yellow  Clarity (UA) Clear  Glucose (UA) Negative  Bilirubin (UA) Negative  Ketones (UA) Negative  Specific Gravity (UA) 1.010  Blood (UA) Negative  pH (UA) 7.0  Protein (UA) Negative  Nitrite (UA) Negative  Leukocyte Esterase (UA) Negative (Result(s) reported on 06 Apr 2012 at 02:06PM.)  RBC (UA) 1 /HPF  WBC (UA) 1 /HPF  Bacteria (UA) NONE SEEN  Epithelial Cells (UA) NONE SEEN  Mucous (UA) PRESENT  (Result(s) reported on 06 Apr 2012 at 02:06PM.)  Routine Hem:  09-Feb-14 14:03   WBC (CBC) 6.3  RBC (CBC) 5.25  Hemoglobin (CBC) 15.8  Hematocrit (CBC) 46.4  Platelet Count (CBC) 168 (Result(s) reported on 06 Apr 2012 at 02:26PM.)  MCV 88  MCH 30.1  MCHC 34.1  RDW 13.9  10-Feb-14 04:00   WBC (CBC) 6.2  RBC (CBC) 4.93  Hemoglobin (CBC) 14.5  Hematocrit (CBC) 43.9  Platelet Count (CBC) 150  MCV 89  MCH 29.3  MCHC 32.9  RDW 14.1  Neutrophil % 62.9  Lymphocyte % 22.4  Monocyte % 12.3  Eosinophil % 2.1  Basophil % 0.3  Neutrophil # 3.9  Lymphocyte # 1.4  Monocyte # 0.8  Eosinophil # 0.1  Basophil # 0.0 (Result(s) reported on 07 Apr 2012 at 04:31AM.)   Radiology Results: [place]:    10-Feb-14 11:16, US Carotid Doppler Bilateral  US Carotid Doppler Bilateral   REASON FOR EXAM:    AMS, syncope  COMMENTS:       PROCEDURE: [place]  - US CAROTID DOPPLER BILATERAL  - Apr 07 2012 11:16AM     RESULT: Comparison: None    Technique: Gray-scale, color Doppler, and spectral Doppler images were   obtained of the extracranial carotid artery systems and vertebral   arteries in the neck.    Findings:  Mild to moderate plaque is demonstrated in the carotid bulbs and proximal   internal carotid arteries. There is some shadowing in the carotid bulbs   which limits visual inspection. Otherwise, by visual inspection, there is     less than 50% stenosis. The peak systolic velocities are not elevated.    The vertebral arteries are patent bilaterally.    IMPRESSION:    No evidence of hemodynamically significant stenosis in the extracranial   carotid arteries.    Dictation site: 2        Verified By: Gregor Hams, M.D., MD  CT:    09-Feb-14 13:03, CT Head Without Contrast  CT Head Without Contrast   REASON FOR EXAM:    syncopal episodes  COMMENTS:       PROCEDURE: CT  - CT HEAD WITHOUT CONTRAST  - Apr 06 2012  1:03PM     RESULT: Comparison:  None    Technique: Multiple axial  images from the foramen magnum to the vertex   were obtained without IVcontrast.    Findings:    There is no evidence for mass effect, midline shift, or extra-axial fluid   collections. There is no evidence for space-occupying lesion,   intracranial hemorrhage, or cortical-based area of infarction. Slight   hyperattenuation along the interhemispheric fissure is felt to related to     mineralization.    The osseous structures are unremarkable.    IMPRESSION:    No acute intracranial process.      Dictation Site: 8        Verified By: Gregor Hams, M.D., MD   Electronic Signatures: Anabel Bene (MD)  (Signed 11-Feb-14 23:32)  Authored: REFERRING PHYSICIAN, Primary Care Physician, Consult, History of Present Illness, Review of Systems, PAST MEDICAL/SURGICAL HISTORY, HOME MEDICATIONS, Current Medications, ALLERGIES, NURSING VITAL SIGNS, LAB RESULTS, RADIOLOGY RESULTS   Last Updated: 11-Feb-14 23:32 by Anabel Bene (MD)

## 2014-06-18 NOTE — H&P (Signed)
PATIENT NAME:  Jeffery Strong, Jeffery Strong MR#:  [id number] DATE OF BIRTH:  August 24, 1940  DATE OF ADMISSION:  04/06/2012  REFERRING PHYSICIAN: Dr. Doug Sou   FAMILY PHYSICIAN: Dr. Barry Brunner  REASON FOR ADMISSION: Altered mental status with presumed seizures.   HISTORY OF PRESENT ILLNESS: The patient is a 74 year old male with a history of Parkinson's disease and underlying coronary artery disease who was in church this morning and developed recurrent episodes of near syncope associated with aphasia. He was brought into the Emergency Room where he had similar symptoms occur while here. This was observed by the neurologist on call and was felt to be related to status epilepticus with ongoing seizures. He was given IV Ativan and loaded with Cerebyx and Keppra in the Emergency Room with resolution of his symptoms. He is now lethargic from the IV Ativan and is now admitted for further evaluation.   PAST MEDICAL HISTORY: 1. Parkinson's disease.  2. ASCVD, status post PTCA with stent placement.  3. BPH.  4. History of prostatitis.  5. Benign hypertension.  6. Hyperlipidemia.  7. Previous back surgery.  8. Status post cholecystectomy.   MEDICATIONS: 1. Toprol-XL 50 mg p.o. daily.  2. Azilect 1 mg p.o. daily.  3. Lipitor 20 mg p.o. at bedtime.  4. Aspirin 325 mg p.o. daily.  5. Xanax 0.25 mg p.o. q. 6 hours as needed for anxiety.   ALLERGIES: No known drug allergies.   SOCIAL HISTORY: The patient has a remote history of tobacco abuse but none recently. Denies alcohol abuse.   FAMILY HISTORY: Positive for diabetes, hypertension, stroke, and coronary artery disease. Negative for prostate or colon cancer.   REVIEW OF SYSTEMS: Unable to obtain from the patient due to his lethargy and somnolence.   PHYSICAL EXAMINATION: GENERAL: The patient is lethargic and somnolent but in no acute distress.  VITAL SIGNS: Currently remarkable for a blood pressure of 116/58, with a heart rate of 66, and a  respiratory rate of 11. He is afebrile.  HEENT: Normocephalic, atraumatic. Pupils are equal, round, and reactive to light and accommodation. Extraocular movements are intact. Sclerae are anicteric. Conjunctivae are clear. Oropharynx is dry but clear.  NECK: Supple without jugular venous distention or bruits. No adenopathy or thyromegaly is noted.  LUNGS: Clear to auscultation and percussion without wheezes, rales, or rhonchi. No dullness.  CARDIAC: Regular rate and rhythm. Normal S1, S2. No significant rubs, murmurs, or gallops. PMI is nondisplaced. Chest wall is nontender.  ABDOMEN: Soft, nontender with normoactive bowel sounds. No organomegaly or masses were appreciated. No hernias or bruits were noted.  EXTREMITIES: Without clubbing, cyanosis, or edema. Pulses were 2+ bilaterally.  SKIN: Warm and dry without rash or lesions.  NEUROLOGIC: Cranial nerves II through XII were grossly intact. Deep tendon reflexes were symmetric. Motor and sensory exam was nonfocal.  PSYCHIATRIC: The patient was somnolent and sleeping but would arouse to painful stimuli.   LABORATORY, DIAGNOSTIC AND RADIOLOGICAL DATA:  EKG revealed sinus rhythm with no acute ischemic changes. Head CT was unremarkable.   Urinalysis: Negative. White count 6.3, with a hemoglobin of 15.8. Glucose was 89, with a BUN of 14, creatinine 1.07, and a sodium of 140 with potassium 3.6. Troponin was less than 0.02. ABG revealed a pH of 7.39, with a pO2 of 74 on room air and a saturation of 95%.   ASSESSMENT: 1. Altered mental status, presumably due to status epilepticus.  2. Parkinson's disease.  3. Atherosclerotic cardiovascular disease.  4. Hyperlipidemia.  5. Benign  hypertension.   PLAN: The patient was seen in the Emergency Room by Neurology, who recommended admission to the ICU. He was loaded with Cerebyx and Keppra. He will now be started on p.o. Keppra and p.o. Dilantin per Neurology's recommendations. We will perform neuro checks q. 4  hours and monitor his vital signs q. 4 hours. We will continue his outpatient regimen for hypertension, hyperlipidemia, and Parkinson's disease. We will obtain an MRI and an EEG in the morning. We will use IV Ativan as needed for recurrent seizures or agitation through the night. Await further recommendations from Neurology tomorrow morning in consultation follow-up. Further treatment and evaluation will depend upon the patient's progress.   TOTAL TIME SPENT: 50 minutes.  ____________________________ Duane LopeJeffrey D. Judithann SheenSparks, MD jds:cb D: 04/06/2012 16:44:11 ET T: 04/06/2012 17:05:51 ET JOB#: [id number]  cc: Duane LopeJeffrey D. Judithann SheenSparks, MD, <Dictator> Jorje GuildGlenn R. Beckey DowningWillett, MD Rilynn Habel Rodena Medin Charmian Forbis MD ELECTRONICALLY SIGNED 04/06/2012 18:01

## 2014-06-20 NOTE — Consult Note (Signed)
PATIENT NAME:  Jeffery Strong, Jeffery Strong MR#:  [id number] DATE OF BIRTH:  November 07, 1940  DATE OF CONSULTATION:  05/09/2011  REFERRING PHYSICIAN:   CONSULTING PHYSICIAN:  Lutricia Feil, MD/Jeriko Kowalke Denman George, NP  PRIMARY CARE PHYSICIAN: Dr. Beckey Downing   CARDIOLOGIST: Dr. Darrold Junker   HISTORY OF PRESENT ILLNESS: Jeffery Strong is a 74 year old Caucasian gentleman with history significant for cardiac disease, stenting, Parkinson's, HL and hypertension for chest pain, question of esophagitis. Appreciate Cardiology note. Please refer to admission history and physical for full details. The patient has ruled out from cardiac source of chest pain and he reports today that he had some gastroenteritis over the weekend, however, states prior to the event he was beginning to have early satiety, decreased appetite, sour brash and some abdominal bloating since the virus. He has now had some substernal discomfort which increases when he drinks cold liquids and has some heartburn. He has been on full strength aspirin daily. Denies other NSAIDs. States he had been taking Zantac p.r.n. for gastroesophageal reflux disease but has  not used this medication so much recently. He denies abdominal pain, black tarry stools, blood in the stool, and states that the nausea and vomiting have mostly stopped. He did still have a loose stool today. Noted that his C. difficile was negative, there were no white blood cells or red blood cells, and his culture results are pending. He has had an EGD in the past by Dr. Mechele Collin. This was done in 2007. It demonstrated a small hiatal hernia, gastritis, and duodenitis. It was negative for H. pylori, dysplasia, and malignancy. He also underwent colonoscopy in October of 2012 which demonstrated one hyperplastic polyp and one adenomatous polyp. Negative for high-grade dysplasia and malignancy.   PAST MEDICAL HISTORY:  1. Coronary artery disease, stent placement in 1999.  2. Hypertension.  3. Hyperlipidemia.   4. Parkinson's disease. 5. Resting tremors.   PAST SURGICAL HISTORY:  1. Cholecystectomy. 2. Throat surgery. 3. Back surgery.   HOME MEDICATIONS:  1. Metoprolol XL 50 mg p.o. q.p.m.  2. Atorvastatin 20 mg p.o. at bedtime.  3. Aspirin 325 mg p.o. daily.  4. Xanax 0.25 mg b.i.d. p.r.n. 5. Azilect 1 mg p.o. daily.   ALLERGIES: No known drug allergies.   SOCIAL HISTORY: Lives with his wife. No smoking, alcohol, or illicits.   FAMILY HISTORY: Significant for colon cancer in his mother, stomach cancer in his brother, Parkinson's in his father.  REVIEW OF SYSTEMS: GENERAL: No fever. No weight loss. No chills. Mild fatigue. PSYCH: Mood stable. No complaints of depression or anxiety. HEAD: No headaches or injury. SKIN: No erythema, lesion, or rash. EYES: No jaundice, itching, dryness, redness, tearing. ENT: No loss of hearing, bleeding, hoarseness, sore throat. RESPIRATORY: No complaints of shortness of breath, cough, wheezing. CARDIAC: Substernal chest pain intermittently as noted, has ruled out cardiac source at this time. Has had cardiac consultation. No palpitations, murmur, or tachycardia. Does have a history of hypertension. GI: As noted. GU: No complaints of any dysuria. HEMATOLOGIC/ENDOCRINE: No history of diabetes, thyroid problems, temperature intolerances. EXTREMITIES: No unusual muscle or joint pain. No swelling, discoloration, or easy bruising or bleeding. NEUROLOGIC: No history of stroke. History is significant for Parkinson's and some resting tremors. No fainting or seizures   LABORATORY, DIAGNOSTIC, AND RADIOLOGICAL DATA: Most recent lab work serum glucose 113, BUN 35, creatinine 1.48, serum sodium 136, potassium 3.8, chloride 101, GFR 50, calcium 7.9, total protein 8.1, albumin 3.7, total bilirubin 0.9, ALP 84, AST 47, ALT 38. CK  total has been 87, 79, and 79. CPK-MB 0.6, 0.5, 0.7. Troponin-I 0.02, 0.02, and 0.02. WBC 6.2, hemoglobin 6.0, hematocrit 47.5, platelets 156, normocytosis.  PT 13.5. INR 1. Stool for white cells and red cells have been negative. C. difficile stool negative. Stool culture pending. Urinalysis is without signs of urinary tract infection. EKG with normal sinus rhythm. Chest x-ray with no acute cardiopulmonary disease.     PHYSICAL EXAMINATION:   VITALS: Most recent vital signs temperature 97.8, pulse 72, respiratory rate 18, blood pressure 123/68, SAO2 94% on room air.   GENERAL: Well appearing Caucasian man lying in bed in no acute distress.   PSYCH: Mood stable, thought logical, pleasant and cooperative, good memory skills.   HEENT: Normocephalic, atraumatic. No redness, drainage, or inflammation to the eyes or the nares. Oral mucous membranes are pink and moist.   NECK: No thyromegaly, lymphadenopathy, or JVD. Trachea midline.   RESPIRATORY: Respirations eupneic. Lungs clear to auscultation bilaterally.    CARDIAC: S1, S2. Regular rate and rhythm. No MRG. No edema. Peripheral pulses 2+ bilaterally.   ABDOMEN: Protuberant abdomen. Active bowel sounds. Soft, nontender. No hepatosplenomegaly, hernias, rebound tenderness, bruits, or other peritoneal signs.   RECTAL: Deferred.   GU: Deferred.   NEUROLOGIC: Alert, oriented x3. Cranial nerves II through XII grossly intact. Face without droop. Speech clear.   SKIN: No erythema, lesion, or rash.   EXTREMITIES: No cyanosis, clubbing, or edema. Strength 5 out of 5. Sensation intact.   IMPRESSION AND PLAN: Gastroesophageal reflux disease, possible esophagitis and NSAID use. Pantoprazole 40 mg p.o. b.i.d. EGD tomorrow. Discussed the risks and benefits with patient and wife. The patient is agreeable. May want to consider decreasing ASA to 81 mg p.o. daily if severe inflammation if okay with Cardiology.    These services were provided by Vevelyn Pathristiane Robyn Galati, NP in collaboration with Dr. Lutricia FeilPaul Oh.  ____________________________ Keturah Barrehristiane H. Karmelo Bass, NP chl:drc D: 05/09/2011 17:24:55 ET T: 05/09/2011  17:42:33 ET JOB#: [id number]  cc: Keturah Barrehristiane H. Evaluna Utke, NP, <Dictator> Eustaquio MaizeHRISTIANE H Sheyanne Munley FNP ELECTRONICALLY SIGNED 05/10/2011 8:43

## 2014-06-20 NOTE — H&P (Signed)
PATIENT NAME:  Jeffery Strong, Jeffery Strong MR#:  [id number] DATE OF BIRTH:  Sep 17, 1940  DATE OF ADMISSION:  05/08/2011  PRIMARY CARE PHYSICIAN: Dr. Beckey Downing CARDIOLOGIST: Dr. Darrold Junker  CHIEF COMPLAINT: Chest pain and having stomach virus the last two days.   HISTORY OF PRESENT ILLNESS: This is a 74 year old male who has history of coronary artery disease. He had a stent in his right RV branch in 1999, also history of hypertension, hyperlipidemia, and Parkinson's disease. He said that he has had a stomach virus since Saturday night, for about 2 to 3 days. He is having diarrhea. He had a couple of episodes of vomiting also. He had about 3 to 4 bowel movements today. He was complaining of some abdominal swelling, but the reason he presented to the Emergency Room was that he started having chest pain. He said he had chest pain yesterday and it did not get better until 5:00 this morning. Then he had recurrence of chest pain later in the afternoon around 2:00 and he has been having chest pain for six hours now. He said it is in the middle of his chest radiating to his left arm. It is similar to the chest pain he had when he had the stent placement. He was complaining of some shortness of breath today with the chest pain also. He had a recent cardiac work-up done by Dr. Darrold Junker which included an echocardiogram which showed normal LV function, ejection fraction of 55%, and mild left ventricular hypertrophy. He had a stress test done which was negative though limited sensitivity on 04/25/2011.  He said this is kind of the worst chest pain he had.  He has had chest pain off and on in the past also. He took his aspirin 325 mg today already. His initial work-up in the Emergency Room showed that he was dehydrated with a creatinine of 1.4, BUN of 35, hypokalemic, and with bicarbonate of 19.  The hospitalist was asked to admit the patient with chest pain, dehydration, and diarrhea.   REVIEW OF SYSTEMS: Denies any fever. No acute  change in vision. No headache but he was complaining of dizziness.  No cough. No dyspnea. He presents with chest pain, also complaining of shortness of breath. Having vomiting and diarrhea. No GI bleed.  No dysuria. No frequency.  No thyroid problems. No anemia. No rash. No joint pains or swelling. No focal numbness or weakness. He has a history of anxiety.   PAST MEDICAL HISTORY:  1. Coronary artery disease with previous stent placement in RV branch in 1999.  2. Hypertension.  3. Hyperlipidemia.  4. Parkinson's disease.  5. He has seen Dr. Sherryll Burger in the past for minimal signs of parkinsonism in terms of  resting tremors.   PAST SURGICAL HISTORY:  1. Cholecystectomy. 2. Throat surgery.   HOME MEDICATIONS:  1. Metoprolol XL 50 mg daily at night. 2. Atorvastatin 20 mg at bedtime.  3. Aspirin 325 mg daily.  4. Xanax 0.25 mg twice a day as needed.  5. Azilect 1 mg daily.   SOCIAL HISTORY:  He lives with his wife. No smoking or alcohol use.   FAMILY HISTORY: Positive for Parkinson's disease in his father. No history of early coronary artery disease.   PHYSICAL EXAMINATION:  VITAL SIGNS:  Temperature 97.6, heart rate 86, respirations 18, blood pressure 134/87, saturating 93% on room air.  This is an elderly Caucasian male, overweight, still having some mild chest pain, but better than before.   HEENT: Bilateral pupils equal.  Extraocular movements are intact. No scleral icterus. No conjunctivitis. Oral mucosa is moist. No pallor.   NECK: No thyroid tenderness, enlargement, or nodules. Neck is supple. No masses, nontender. No adenopathy. No JVD. No carotid bruit.   CHEST: Bilateral breath sounds are clear. No wheeze. Normal effort. No respiratory distress.   HEART: Heart sounds are regular. No murmur. Good peripheral pulses. No lower extremity edema. No chest tenderness.   ABDOMEN: Soft and nontender. Normal bowel sounds. No hepatomegaly. No bruit. No masses.   RECTAL: Deferred.    NEUROLOGIC: He is awake, alert, oriented to time, place, and person. Cranial nerves are intact. Moving all extremities against gravity.   EXTREMITIES: No cyanosis. No clubbing.   SKIN: No rash. No lesions.   LABORATORY, DIAGNOSTIC, AND RADIOLOGICAL DATA: White count 5.5, hemoglobin 17.7, platelet count 158,000. He may be hemoconcentrated. BMP: Sodium 135, potassium 3.3, BUN 35, creatinine 1.42, glucose 115. LFTs show AST of 47. CK is 87. Troponin is negative. Chest x-ray results are pending. His EKG shows that he has sinus rhythm. He has Q waves in inferior leads, nonspecific T-wave changes.   IMPRESSION:  1. Chest pain with both typical and atypical features. Rule out myocardial infarction with previous history of coronary artery disease with recent stress test being negative.  2. Diarrhea.  3. Dehydration.  4. Acute renal failure, most likely prerenal.  5. Mild hypokalemia.  6. Mild hyponatremia.  7. Hypertension.  8. Hyperlipidemia.   PLAN: This is a 74 year old male with history of coronary artery disease, hypertension, and hyperlipidemia. He recently had cardiac work-up done by [facility]Paraschos [facility] with normal echocardiogram and negative stress test. He has had diarrhea for about 2-3 days. He is dehydrated. I will give him some IV hydration, replace his potassium. His bicarbonate is also on the lower side, mainly because of diarrhea.  The reason he presented to the Emergency Room was chest pain. He said he is having pronounced chest pain with some shortness of breath. His EKG does not show any acute ischemia. His initial cardiac enzymes are negative. We will do serial cardiac enzymes and we will get a cardiology consult. We will continue his aspirin, statin, and beta blocker. We will hold off Lovenox at this time. We will also add nitro paste. If he continues to have chest pain then he may need cardiac catheterization because he already had a recent stress test.  I will admit  him to observation.       TIME SPENT ON ADMISSION AND COORDINATION OF CARE:  50 minutes.   ____________________________ Fredia SorrowAbhinav Merced Brougham, MD ag:bjt D: 05/08/2011 20:13:38 ET T: 05/09/2011 06:06:32 ET JOB#: [id number]  cc: Fredia SorrowAbhinav Joanna Borawski, MD, <Dictator> Jorje GuildGlenn R. Beckey DowningWillett, MD Marcina MillardAlexander Paraschos, MD Fredia SorrowABHINAV Sherril Shipman MD ELECTRONICALLY SIGNED 05/27/2011 14:55

## 2014-06-20 NOTE — Consult Note (Signed)
PATIENT NAME:  Jeffery Strong, Jeffery Strong MR#:  [id number] DATE OF BIRTH:  21-Sep-1940  DATE OF CONSULTATION:  05/09/2011  REFERRING PHYSICIAN:   CONSULTING PHYSICIAN:  Marcina MillardAlexander Terance Pomplun, MD  PRIMARY CARE PHYSICIAN: Dr. Beckey DowningWillett    CHIEF COMPLAINT: I've had a virus.   HISTORY OF PRESENT ILLNESS: The patient is a 74 year old gentleman with known history of coronary artery disease. He's had 2 to 3 day history of abdominal discomfort, diarrhea, nausea, and vomiting. The patient [facility] Room where he commented that he does have some substernal chest discomfort which is of chronic nature. He just recently had an echocardiogram which revealed normal left ventricular function and a treadmill stress test where he exercised 6 minutes and 44 seconds on the Bruce protocol without ECG changes or chest pain. The patient was admitted for observation. He has ruled out for myocardial infarction with negative troponin and CPK-MB. The patient feels better today.   PAST MEDICAL HISTORY:  1. Status post stent RV branch 11/02/1997.  2. Hypertension.  3. Hyperlipidemia.  4. Parkinson's disease.   MEDICATIONS ON ADMISSION:  1. Aspirin 325 mg daily.  2. Metoprolol XL 50 mg daily.  3. Atorvastatin 20 mg at bedtime.  4. Xanax 0.25 mg b.i.Strong. p.r.n.  5. Azilect 1 mg daily.   SOCIAL HISTORY: The patient is married, lives with his wife. He denies tobacco or EtOH abuse.   FAMILY HISTORY: No immediate family history for myocardial infarction or coronary artery disease.   REVIEW OF SYSTEMS: CONSTITUTIONAL: No fever or chills. EYES: No blurry vision. EARS: No hearing loss. RESPIRATORY: The patient does have some mild shortness of breath. CARDIOVASCULAR: The patient has had some mild chest pain. GI: The patient has had nausea, vomiting, and diarrhea. GU: The patient denies dysuria or hematuria. ENDOCRINE: The patient denies polyuria or polydipsia. MUSCULOSKELETAL: The patient denies arthralgias or myalgias.  NEUROLOGICAL: The patient denies any focal muscle weakness or numbness. PSYCHOLOGICAL: The patient denies depression or anxiety.   PHYSICAL EXAMINATION:   VITAL SIGNS: Blood pressure 115/72, pulse 72, respirations 18, temperature 97.8, pulse oximetry 93%.   HEENT: Pupils equal, reactive to light and accommodation.   NECK: Supple without thyromegaly.   LUNGS: Clear.   CARDIOVASCULAR: Normal JVP. Normal PMI. Regular rate and rhythm. S1, S2. No appreciable gallop, murmur, or rub.   ABDOMEN: Soft, nontender. Pulses were intact bilaterally.   MUSCULOSKELETAL: Normal muscle tone.   NEUROLOGIC: The patient is alert and oriented x3. Motor and sensory both grossly intact.   IMPRESSION: This is a 74 year old gentleman with known coronary artery disease who presents with viral syndrome, has ruled out for myocardial infarction by CPK isoenzymes and troponin. The patient has clinically responded to intravenous fluids overnight.   RECOMMENDATIONS:  1. Agree with overall current therapy.  2. Would defer any further cardiac work-up at this time especially in light of recent viral syndrome.     3. From a cardiovascular perspective, the patient may be discharged home.  ____________________________ Marcina MillardAlexander Katelan Hirt, MD ap:drc Strong: 05/09/2011 12:33:31 ET T: 05/09/2011 15:43:35 ET JOB#: [id number]  cc: Marcina MillardAlexander Kervin Bones, MD, <Dictator> Marcina MillardALEXANDER Laren Orama MD ELECTRONICALLY SIGNED 05/18/2011 8:49

## 2014-06-20 NOTE — Consult Note (Signed)
Brief Consult Note: Diagnosis: Chest pain.   Patient was seen by consultant.   Consult note dictated.   Recommend to proceed with surgery or procedure.   Discussed with Attending MD.   Comments: Appreciate consult for 74 y/o caucasian man with history significant for cardiac disease/stenting, parkinson's, HL, and htn for chest pain, question of esophagitis. Appreciate cardiology note. Patient reports today that although he had gastroenteritis over the weekend, that prior to event, he was beginning to have early satiety, decreased appetite, sour brash, and bloating. Since the virus, he now has some substernal discomfort which increases when he drinks cold liquids and some heartburn. Has been on full strength ASA daily, denies other NSAIDs. States he had been taking Zantac prn for GERD- but so much recently. Denies abdominal pain, black tarry stools, blood in stool, and states that the nausea and vomiting have stopped. Still has some occasional loose stools, c-diff negative, no wbc/rbc, culture pending.  Has had EGD by Dr Markham JordanElliot, does not remember results. Colonscopy last summer with a few polyps. Family history significant for colon cancer in mother, stomach cancer in brother.   Impression: GERD, possible esophagitis, NSAID use. Pantoprazole 40mg  po bid. EGD tomorrow. Discussed risks and benefits. Patient agreeable. May want to consider decreasing ASA to 81mg  if severe inflammation if ok with cardiology.  Electronic Signatures: Vevelyn PatLondon, Verlean Allport H (NP)  (Signed 13-Mar-13 16:48)  Authored: Brief Consult Note   Last Updated: 13-Mar-13 16:48 by Keturah BarreLondon, Nyana Haren H (NP)

## 2014-06-20 NOTE — Consult Note (Signed)
EGD showed grade 2 reflux esophagitis. Continue protonix bid for min 4 wks and daily protonix/PPI afterwards. Consider switching from ASA to bASA daily if cardiology allows. Will sign off. Pt can f/u with us after discharge in few weeks. THanks.  Electronic Signatures: Lutricia Feilh, Sheikh Leverich (MD)  (Signed on 14-Mar-13 08:40)  Authored  Last Updated: 14-Mar-13 08:40 by Lutricia Feilh, Makhya Arave (MD)

## 2014-06-20 NOTE — Discharge Summary (Signed)
PATIENT NAME:  Jeffery Strong, Jeffery Strong MR#:  [id number] DATE OF BIRTH:  Mar 31, 1940  DATE OF ADMISSION:  05/08/2011 DATE OF DISCHARGE:  05/10/2011  PRIMARY CARE PHYSICIAN: Dr. Beckey DowningWillett PRIMARY CARDIOLOGIST: Dr. Darrold JunkerParaschos  ADMITTING PHYSICIAN: Dr. Fredia SorrowAbhinav Gupta DISCHARGING PHYSICIAN: Dr. Larena GlassmanAmir Quinnetta Strong   ADMITTING DIAGNOSES: Chest pain, diarrhea.   DISCHARGE DIAGNOSES:  1. Diarrhea secondary to viral gastroenteritis.  2. Stomach pain secondary to viral gastritis.  3. Chest pain.  4. Esophagitis.   CONSULTANTS:  1. Dr. Bluford Kaufmannh. 2. Dr. Darrold JunkerParaschos.    LABORATORY, DIAGNOSTIC AND RADIOLOGICAL DATA: Chest x-ray 05/08/2011 showed no acute cardiopulmonary disease. EGD showed LA grade B reflux esophagitis. Examination was normal otherwise. Normal stomach, normal examined duodenum. On day of discharge glucose 87, BUN 20, creatinine 0.98, sodium 138, potassium 2.9, chloride 19.  HOSPITAL COURSE: Initial history and physical were done by Dr. Chales AbrahamsGupta. Please refer to his note dated 05/08/2011 for complete details. In brief this is a 74 year old white male with past medical history of coronary artery disease status post stent in 1999, history of hypertension, hyperlipidemia, Parkinson's disease. York SpanielSaid he has had a stomach virus for about 2 to 3 days. He is having diarrhea, episodes of nausea and vomiting as well as abdominal pain. He showed dehydration on his initial workup with a creatinine of 1.4, BUN 35, bicarbonate 19 and he was hypokalemic. In addition, patient was complaining of chest pain. He was admitted to the hospitalist service.  1. Diarrhea, nausea, vomiting, abdominal pain, probably from viral gastroenteritis. Stool studies were negative. EGD showed esophagitis. He was recommended to be discharged on Protonix with follow up with Dr. Bluford Kaufmannh. If symptoms recur probably would consider getting CT scan to assess for colitis.  2. Chest pain. Patient was monitorred on telemetry.  Three sets of cardiac enzymes with no troponin  leak. He was continued on aspirin. Dr. Darrold JunkerParaschos stated patient had recent stress test which showed no ischemia. Chest pain was most consistent with esophagitis and he recommended follow up with him as outpatient.  3. Acute renal failure, most likely prerenal. This improved with IV fluids.  4. Mild hypokalemia. This was repleted.  5. Hyponatremia. This improved with IV fluid, most likely due to dehydration.  6. Hypertension. Blood pressure was stable.  7. Hyperlipidemia. Patient was continued on atorvastatin.  8. DVT prophylaxis. Maintained with Lovenox.   PHYSICAL EXAMINATION: Patient was discharged on 05/10/2011. VITAL SIGNS: Temperature 98.2, heart rate 77, respiratory rate 20, blood pressure 137/73, sating 96% on room air. LUNGS: Clear to auscultation. CARDIOVASCULAR: Regular rate and rhythm. ABDOMEN: Benign. His diarrhea was almost resolved.   DISCHARGE MEDICATIONS: Patient was discharged on the following medications: 1. Nitrostat 0.4 mg sublingual every five minutes p.r.n. chest pain. 2. Toprol-XL 50 mg daily. 3. Lipitor 20 mg daily.  4. Aspirin 325 mg daily.  5. Xanax 0.25 mg p.o. b.i.Strong. p.r.n.  6. Protonix 40 mg daily. 7. Azilect 4 mg daily.   DIET: Low sodium diet.   ACTIVITY: As tolerated.   FOLLOW UP: Follow up with Dr. Beckey DowningWillett in one week, Dr. Darrold JunkerParaschos in two weeks and Dr. Bluford Kaufmannh in three weeks   CODE STATUS: FULL CODE.      TOTAL TIME SPENT ON DISCHARGE: 45 minutes.   ____________________________ Corie ChiquitoAmir A. Lafayette DragonFirozvi, MD aaf:cms Strong: 05/10/2011 21:33:56 ET T: 05/11/2011 10:58:50 ET JOB#: [id number]  cc: Karolee OhsAmir A. Lafayette DragonFirozvi, MD, <Dictator> Jorje GuildGlenn R. Beckey DowningWillett, MD Marcina MillardAlexander Paraschos, MD Ezzard StandingPaul Y. Bluford Kaufmannh, MD Coralyn PearAMIR A Tyrena Gohr MD ELECTRONICALLY SIGNED 05/11/2011 14:23

## 2014-06-20 NOTE — Consult Note (Signed)
Pt seen and examined. See C.London'a notes. Prob GERD and poss esophagitis. Await stool studies. Started po protonix. Plan EGD tomorrow afternoon. Thanks.  Electronic Signatures: Lutricia Feilh, Ogechi Kuehnel (MD)  (Signed on 13-Mar-13 16:45)  Authored  Last Updated: 13-Mar-13 16:45 by Lutricia Feilh, Eddis Pingleton (MD)

## 2015-06-13 DIAGNOSIS — E782 Mixed hyperlipidemia: Secondary | ICD-10-CM | POA: Diagnosis present

## 2015-12-23 ENCOUNTER — Ambulatory Visit: Payer: Medicare Other | Admitting: Anesthesiology

## 2015-12-23 ENCOUNTER — Encounter: Payer: Self-pay | Admitting: *Deleted

## 2015-12-23 ENCOUNTER — Ambulatory Visit
Admission: RE | Admit: 2015-12-23 | Discharge: 2015-12-23 | Disposition: A | Discharge status: Home / Self Care | Payer: Medicare Other | Source: Ambulatory Visit | Attending: Unknown Physician Specialty | Admitting: Unknown Physician Specialty

## 2015-12-23 ENCOUNTER — Encounter
Admission: RE | Disposition: A | Discharge status: Home / Self Care | Payer: Self-pay | Source: Ambulatory Visit | Attending: Unknown Physician Specialty

## 2015-12-23 DIAGNOSIS — Z86010 Personal history of colonic polyps: Secondary | ICD-10-CM | POA: Insufficient documentation

## 2015-12-23 DIAGNOSIS — R634 Abnormal weight loss: Secondary | ICD-10-CM | POA: Insufficient documentation

## 2015-12-23 DIAGNOSIS — Z955 Presence of coronary angioplasty implant and graft: Secondary | ICD-10-CM | POA: Insufficient documentation

## 2015-12-23 DIAGNOSIS — K2950 Unspecified chronic gastritis without bleeding: Secondary | ICD-10-CM | POA: Diagnosis not present

## 2015-12-23 DIAGNOSIS — I10 Essential (primary) hypertension: Secondary | ICD-10-CM | POA: Insufficient documentation

## 2015-12-23 DIAGNOSIS — Z7982 Long term (current) use of aspirin: Secondary | ICD-10-CM | POA: Insufficient documentation

## 2015-12-23 DIAGNOSIS — Z800 Family history of malignant neoplasm of digestive organs: Secondary | ICD-10-CM | POA: Diagnosis not present

## 2015-12-23 DIAGNOSIS — Z1211 Encounter for screening for malignant neoplasm of colon: Secondary | ICD-10-CM | POA: Diagnosis not present

## 2015-12-23 DIAGNOSIS — K219 Gastro-esophageal reflux disease without esophagitis: Secondary | ICD-10-CM | POA: Diagnosis not present

## 2015-12-23 DIAGNOSIS — K3189 Other diseases of stomach and duodenum: Secondary | ICD-10-CM | POA: Diagnosis not present

## 2015-12-23 HISTORY — DX: Essential (primary) hypertension: I10

## 2015-12-23 HISTORY — PX: COLONOSCOPY WITH PROPOFOL: SHX5780

## 2015-12-23 HISTORY — PX: ESOPHAGOGASTRODUODENOSCOPY (EGD) WITH PROPOFOL: SHX5813

## 2015-12-23 SURGERY — COLONOSCOPY WITH PROPOFOL
Anesthesia: General

## 2015-12-23 MED ORDER — PROPOFOL 10 MG/ML IV BOLUS
INTRAVENOUS | Status: DC | PRN
  Administered 2015-12-23: 100 mg via INTRAVENOUS

## 2015-12-23 MED ORDER — FENTANYL CITRATE (PF) 100 MCG/2ML IJ SOLN
INTRAMUSCULAR | Status: DC | PRN
  Administered 2015-12-23: 50 ug via INTRAVENOUS

## 2015-12-23 MED ORDER — PHENYLEPHRINE HCL 10 MG/ML IJ SOLN
INTRAMUSCULAR | Status: DC | PRN
  Administered 2015-12-23 (×2): 100 ug via INTRAVENOUS

## 2015-12-23 MED ORDER — MIDAZOLAM HCL 5 MG/5ML IJ SOLN
INTRAMUSCULAR | Status: DC | PRN
  Administered 2015-12-23: 1 mg via INTRAVENOUS

## 2015-12-23 MED ORDER — PROPOFOL 500 MG/50ML IV EMUL
INTRAVENOUS | Status: DC | PRN
  Administered 2015-12-23: 150 ug/kg/min via INTRAVENOUS

## 2015-12-23 MED ORDER — LIDOCAINE 2% (20 MG/ML) 5 ML SYRINGE
INTRAMUSCULAR | Status: DC | PRN
  Administered 2015-12-23: 40 mg via INTRAVENOUS

## 2015-12-23 MED ORDER — SODIUM CHLORIDE 0.9 % IV SOLN
INTRAVENOUS | Status: DC
  Administered 2015-12-23: 1000 mL via INTRAVENOUS

## 2015-12-23 MED ORDER — SODIUM CHLORIDE 0.9 % IV SOLN: INTRAVENOUS | Status: DC

## 2015-12-23 NOTE — Anesthesia Postprocedure Evaluation (Signed)
Anesthesia Post Note  Patient: Jeffery Strong  Procedure(s) Performed: Procedure(s) (LRB): COLONOSCOPY WITH PROPOFOL (N/A) ESOPHAGOGASTRODUODENOSCOPY (EGD) WITH PROPOFOL (N/A)  Patient location during evaluation: PACU Anesthesia Type: General Level of consciousness: awake Pain management: satisfactory to patient Respiratory status: spontaneous breathing Cardiovascular status: stable Anesthetic complications: no    Last Vitals:  Vitals:   12/23/15 1113 12/23/15 1123  BP: (!) 135/93 131/73  Pulse: 75 60  Resp: 16 17  Temp:      Last Pain:  Vitals:   12/23/15 1053  TempSrc: Tympanic                 VAN STAVEREN,Susette Seminara

## 2015-12-23 NOTE — Op Note (Signed)
[facility] Gastroenterology Patient Name: Jeffery GowdaWilliam Castrellon Procedure Date: 12/23/2015 10:14 AM MRN: [id number] Account #: [id number] Date of Birth: January 27, 1941 Admit Type: Outpatient Age: [age] [facility] 4 Gender: Male Note Status: Finalized Procedure:            Upper GI endoscopy Indications:          Weight loss Providers:            Scot Junobert T. Darryn Kydd, MD Referring MD:         Danella PentonMark F. Miller, MD (Referring MD) Medicines:            Propofol per Anesthesia Complications:        No immediate complications. Procedure:            Pre-Anesthesia Assessment:                       - After reviewing the risks and benefits, the patient                        was deemed in satisfactory condition to undergo the                        procedure.                       After obtaining informed consent, the endoscope was                        passed under direct vision. Throughout the procedure,                        the patient's blood pressure, pulse, and oxygen                        saturations were monitored continuously. The Endoscope                        was introduced through the mouth, and advanced to the                        second part of duodenum. The upper GI endoscopy was                        accomplished without difficulty. The patient tolerated                        the procedure well. Findings:      The examined esophagus was normal. GEJ 40cm.      Diffuse and patchy moderate inflammation characterized by erosions,       erythema, granularity and linear erosions was found in the cardia, in       the gastric body and in the gastric antrum. Biopsies were taken with a       cold forceps for histology. Biopsies were taken with a cold forceps for       Helicobacter pylori testing.      A few large nodules with a patchy distribution were found in the       duodenal bulb. Biopsies were taken with a cold forceps for histology. Impression:           -  Normal esophagus.                       -  Gastritis. Biopsied.                       - Nodule found in the duodenum. Biopsied. Recommendation:       - Await pathology results. Scot Jun, MD 12/23/2015 10:31:34 AM This report has been signed electronically. Number of Addenda: 0 Note Initiated On: 12/23/2015 10:14 AM      [facility]

## 2015-12-23 NOTE — Transfer of Care (Signed)
Immediate Anesthesia Transfer of Care Note  Patient: Jeffery Strong  Procedure(s) Performed: Procedure(s): COLONOSCOPY WITH PROPOFOL (N/A) ESOPHAGOGASTRODUODENOSCOPY (EGD) WITH PROPOFOL (N/A)  Patient Location: PACU and Endoscopy Unit  Anesthesia Type:General  Level of Consciousness: sedated  Airway & Oxygen Therapy: Patient Spontanous Breathing and Patient connected to nasal cannula oxygen  Post-op Assessment: Report given to RN and Post -op Vital signs reviewed and stable  Post vital signs: Reviewed and stable  Last Vitals:  Vitals:   12/23/15 0953  BP: 126/75  Pulse: 79  Resp: 18  Temp: 36.6 C    Last Pain:  Vitals:   12/23/15 0953  TempSrc: Tympanic         Complications: No apparent anesthesia complications

## 2015-12-23 NOTE — Op Note (Signed)
[facility] Patient Name: Jeffery GowdaWilliam Strong Procedure Date: 12/23/2015 10:14 AM MRN: [id number] Account #: [id number] Date of Birth: January 06, 1941 Admit Type: Outpatient Age: [age] [facility] 4 Gender: Male Note Status: Finalized Procedure:            Colonoscopy Indications:          Screening in patient at increased risk: Family history                        of 1st-degree relative with colorectal cancer Providers:            Scot Junobert T. Shirle Provencal, MD Referring MD:         Danella PentonMark F. Miller, MD (Referring MD) Medicines:            Propofol per Anesthesia Complications:        No immediate complications. Procedure:            Pre-Anesthesia Assessment:                       - After reviewing the risks and benefits, the patient                        was deemed in satisfactory condition to undergo the                        procedure.                       After obtaining informed consent, the colonoscope was                        passed under direct vision. Throughout the procedure,                        the patient's blood pressure, pulse, and oxygen                        saturations were monitored continuously. The                        Colonoscope was introduced through the anus and                        advanced to the the cecum, identified by appendiceal                        orifice and ileocecal valve. The colonoscopy was                        performed without difficulty. The patient tolerated the                        procedure well. The quality of the bowel preparation                        was excellent. Findings:      A diminutive polyp was found in the cecum. The polyp was sessile. The       polyp was removed with a jumbo cold forceps. Resection and retrieval       were complete.      The  exam was otherwise without abnormality.      Internal hemorrhoids were found during endoscopy. The hemorrhoids were       small and Grade I  (internal hemorrhoids that do not prolapse). Impression:           - One diminutive polyp in the cecum, removed with a                        jumbo cold forceps. Resected and retrieved.                       - The examination was otherwise normal. Recommendation:       - Await pathology results. Scot Jun, MD 12/23/2015 10:49:02 AM This report has been signed electronically. Number of Addenda: 0 Note Initiated On: 12/23/2015 10:14 AM Scope Withdrawal Time: 0 hours 8 minutes 46 seconds  Total Procedure Duration: 0 hours 13 minutes 17 seconds       [facility]

## 2015-12-23 NOTE — H&P (Signed)
   Primary Care Physician:  Danella PentonMark F Miller, MD Primary Gastroenterologist:  Dr. Mechele CollinElliott  Pre-Procedure History & Physical: HPI:  Jeffery Strong is a 75 y.o. male is here for an endoscopy and colonoscopy.   Past Medical History:  Diagnosis Date  . Hypertension     Past Surgical History:  Procedure Laterality Date  . Cardiac Stents      Prior to Admission medications   Medication Sig Start Date End Date Taking? Authorizing Provider  aspirin 325 MG tablet Take 325 mg by mouth daily.   Yes Historical Provider, MD  atorvastatin (LIPITOR) 20 MG tablet Take 20 mg by mouth daily at 6 PM.   Yes Historical Provider, MD  benzonatate (TESSALON) 100 MG capsule Take by mouth 3 (three) times daily as needed for cough.   Yes Historical Provider, MD  busPIRone (BUSPAR) 7.5 MG tablet Take 7.5 mg by mouth 2 times daily at 12 noon and 4 pm.   Yes Historical Provider, MD  diclofenac (CATAFLAM) 50 MG tablet Take 50 mg by mouth 2 (two) times daily.   Yes Historical Provider, MD    Allergies as of 12/15/2015  . (Not on File)    No family history on file.  Social History   Social History  . Marital status: Married    Spouse name: N/A  . Number of children: N/A  . Years of education: N/A   Occupational History  . Not on file.   Social History Main Topics  . Smoking status: Not on file  . Smokeless tobacco: Not on file  . Alcohol use Not on file  . Drug use: Unknown  . Sexual activity: Not on file   Other Topics Concern  . Not on file   Social History Narrative  . No narrative on file    Review of Systems: See HPI, otherwise negative ROS  Physical Exam: BP 126/75   Pulse 79   Temp 97.9 F (36.6 C) (Tympanic)   Resp 18   Ht 5\' 10"  (1.778 m)   Wt 91.6 kg (202 lb)   SpO2 100%   BMI 28.98 kg/m  General:   Alert,  pleasant and cooperative in NAD Head:  Normocephalic and atraumatic. Neck:  Supple; no masses or thyromegaly. Lungs:  Clear throughout to auscultation.    Heart:   Regular rate and rhythm. Abdomen:  Soft, nontender and nondistended. Normal bowel sounds, without guarding, and without rebound.   Neurologic:  Alert and  oriented x4;  grossly normal neurologically.  Impression/Plan: Jeffery Strong is here for an endoscopy and colonoscopy [facility] and weight loss, decreased appetitie  Risks, benefits, limitations, and alternatives regarding  endoscopy and colonoscopy have been reviewed with the patient.  Questions have been answered.  All parties agreeable.   Lynnae PrudeELLIOTT, ROBERT, MD  12/23/2015, 10:08 AM

## 2015-12-23 NOTE — Anesthesia Preprocedure Evaluation (Signed)
Anesthesia Evaluation  Patient identified by MRN, date of birth, ID band Patient awake    Reviewed: Allergy & Precautions, NPO status , Patient's Chart, lab work & pertinent test results  Airway Mallampati: II       Dental  (+) Upper Dentures   Pulmonary neg pulmonary ROS,    breath sounds clear to auscultation       Cardiovascular hypertension, Pt. on medications  Rhythm:Regular Rate:Normal     Neuro/Psych negative neurological ROS  negative psych ROS   GI/Hepatic negative GI ROS, Neg liver ROS,   Endo/Other    Renal/GU negative Renal ROS     Musculoskeletal negative musculoskeletal ROS (+)   Abdominal Normal abdominal exam  (+)   Peds negative pediatric ROS (+)  Hematology negative hematology ROS (+)   Anesthesia Other Findings   Reproductive/Obstetrics                             Anesthesia Physical Anesthesia Plan  ASA: II  Anesthesia Plan: General   Post-op Pain Management:    Induction: Intravenous  Airway Management Planned: Natural Airway and Nasal Cannula  Additional Equipment:   Intra-op Plan:   Post-operative Plan:   Informed Consent: I have reviewed the patients History and Physical, chart, labs and discussed the procedure including the risks, benefits and alternatives for the proposed anesthesia with the patient or authorized representative who has indicated his/her understanding and acceptance.     Plan Discussed with: CRNA  Anesthesia Plan Comments:         Anesthesia Quick Evaluation

## 2015-12-26 ENCOUNTER — Encounter: Payer: Self-pay | Admitting: Unknown Physician Specialty

## 2015-12-26 LAB — SURGICAL PATHOLOGY

## 2016-03-17 ENCOUNTER — Ambulatory Visit
Admission: EM | Admit: 2016-03-17 | Discharge: 2016-03-17 | Disposition: A | Discharge status: Home / Self Care | Payer: Medicare Other | Attending: Family Medicine | Admitting: Family Medicine

## 2016-03-17 ENCOUNTER — Encounter: Payer: Self-pay | Admitting: Emergency Medicine

## 2016-03-17 DIAGNOSIS — G20 Parkinson's disease: Secondary | ICD-10-CM | POA: Diagnosis not present

## 2016-03-17 DIAGNOSIS — Z87891 Personal history of nicotine dependence: Secondary | ICD-10-CM | POA: Insufficient documentation

## 2016-03-17 DIAGNOSIS — Z79899 Other long term (current) drug therapy: Secondary | ICD-10-CM | POA: Diagnosis not present

## 2016-03-17 DIAGNOSIS — J069 Acute upper respiratory infection, unspecified: Secondary | ICD-10-CM | POA: Insufficient documentation

## 2016-03-17 DIAGNOSIS — R05 Cough: Secondary | ICD-10-CM | POA: Diagnosis present

## 2016-03-17 DIAGNOSIS — R69 Illness, unspecified: Secondary | ICD-10-CM | POA: Diagnosis not present

## 2016-03-17 DIAGNOSIS — J111 Influenza due to unidentified influenza virus with other respiratory manifestations: Secondary | ICD-10-CM

## 2016-03-17 DIAGNOSIS — I10 Essential (primary) hypertension: Secondary | ICD-10-CM | POA: Diagnosis not present

## 2016-03-17 LAB — RAPID INFLUENZA A&B ANTIGENS
Influenza A (ARMC): NEGATIVE
Influenza B (ARMC): NEGATIVE

## 2016-03-17 MED ORDER — FEXOFENADINE-PSEUDOEPHED ER 180-240 MG PO TB24: 1.0000 | ORAL_TABLET | Freq: Every day | ORAL | 0 refills | Status: DC

## 2016-03-17 MED ORDER — BENZONATATE 200 MG PO CAPS: 200.0000 mg | ORAL_CAPSULE | Freq: Three times a day (TID) | ORAL | 0 refills | Status: DC | PRN

## 2016-03-17 MED ORDER — AZITHROMYCIN 250 MG PO TABS: ORAL_TABLET | ORAL | 0 refills | Status: DC

## 2016-03-17 NOTE — ED Provider Notes (Signed)
[facility]    CSN: [id number] Arrival date & time: 03/17/16  0904     History   Chief Complaint Chief Complaint  Patient presents with  . Cough    HPI Jeffery Strong is a 76 y.o. male.   Patient is here because of chest congestion and cough. Patient is not the best historian and difficult to getting a complete story from the patient. Everything started Tuesday he has not had his flu shot he is a former smoker but not sure how long ago he stopped smoking. He denies any history COPD and has hypertension but his medical past records indicate multiple problems with GU and GI and psych as well. He states that he's had some chills swollen lower lip and cough (nonproductive and not able to really bring anything up much. He states cough is progressively gotten worse since Tuesday. He does report some achiness of achiness seemed to be getting better but the cough is getting worse he has a history of hypertension and Parkinson disease he's had colonoscopy  and he has seen urology for several different treatments initially denies any other surgeries but with prompting he is able to remember that he did have cardiac stents placed as well. No known allergies.    The history is provided by the patient. The history is limited by the condition of the patient. No language interpreter was used.  Cough  Cough characteristics:  Productive and non-productive Sputum characteristics:  Yellow Severity:  Moderate Duration:  6 days Timing:  Constant Progression:  Worsening Chronicity:  New Smoker: no   Context: upper respiratory infection   Relieved by:  None tried Worsened by:  Nothing Associated symptoms: myalgias, shortness of breath and sinus congestion     Past Medical History:  Diagnosis Date  . Hypertension     There are no active problems to display for this patient.   Past Surgical History:  Procedure Laterality Date  . Cardiac Stents    . COLONOSCOPY WITH PROPOFOL N/A  12/23/2015   Procedure: COLONOSCOPY WITH PROPOFOL;  Surgeon: Scot Jun, MD;  Location: [facility] ENDOSCOPY;  Service: Endoscopy;  Laterality: N/A;  . ESOPHAGOGASTRODUODENOSCOPY (EGD) WITH PROPOFOL N/A 12/23/2015   Procedure: ESOPHAGOGASTRODUODENOSCOPY (EGD) WITH PROPOFOL;  Surgeon: Scot Jun, MD;  Location: [facility] ENDOSCOPY;  Service: Endoscopy;  Laterality: N/A;       Home Medications    Prior to Admission medications   Medication Sig Start Date End Date Taking? Authorizing Provider  selegiline (ELDEPRYL) 5 MG tablet Take 5 mg by mouth daily before breakfast.   Yes Historical Provider, MD  aspirin 325 MG tablet Take 325 mg by mouth daily.    Historical Provider, MD  atorvastatin (LIPITOR) 20 MG tablet Take 20 mg by mouth daily at 6 PM.    Historical Provider, MD  azithromycin (ZITHROMAX Z-PAK) 250 MG tablet Take 2 tablets first day and then 1 po a day for 4 days 03/17/16   Hassan Rowan, MD  benzonatate (TESSALON) 200 MG capsule Take 1 capsule (200 mg total) by mouth 3 (three) times daily as needed. 03/17/16   Hassan Rowan, MD  diclofenac (CATAFLAM) 50 MG tablet Take 50 mg by mouth 2 (two) times daily.    Historical Provider, MD  fexofenadine-pseudoephedrine (ALLEGRA-D ALLERGY & CONGESTION) 180-240 MG 24 hr tablet Take 1 tablet by mouth daily. 03/17/16   Hassan Rowan, MD  gabapentin (NEURONTIN) 300 MG capsule Take 300 mg by mouth 2 (two) times daily.  Historical Provider, MD  losartan-hydrochlorothiazide (HYZAAR) 100-12.5 MG tablet Take 1 tablet by mouth daily.    Historical Provider, MD  metoprolol succinate (TOPROL-XL) 25 MG 24 hr tablet Take 25 mg by mouth daily.    Historical Provider, MD  traZODone (DESYREL) 50 MG tablet Take 50 mg by mouth at bedtime.    Historical Provider, MD    Family History History reviewed. No pertinent family history.  Social History Social History  Substance Use Topics  . Smoking status: Former Games developer  . Smokeless tobacco: Never Used  . Alcohol use  No     Allergies   Imipramine hcl   Review of Systems Review of Systems  Unable to perform ROS: Other  Respiratory: Positive for cough and shortness of breath.   Musculoskeletal: Positive for myalgias.     Physical Exam Triage Vital Signs ED Triage Vitals  Enc Vitals Group     BP 03/17/16 1005 111/63     Pulse Rate 03/17/16 1005 77     Resp 03/17/16 1005 16     Temp 03/17/16 1005 98.3 F (36.8 C)     Temp Source 03/17/16 1005 Oral     SpO2 03/17/16 1005 97 %     Weight 03/17/16 1005 195 lb (88.5 kg)     Height 03/17/16 1005 5\' 10"  (1.778 m)     Head Circumference --      Peak Flow --      Pain Score 03/17/16 1007 8     Pain Loc --      Pain Edu? --      Excl. in GC? --    No data found.   Updated Vital Signs BP 111/63 (BP Location: Left Arm)   Pulse 77   Temp 98.3 F (36.8 C) (Oral)   Resp 16   Ht 5\' 10"  (1.778 m)   Wt 195 lb (88.5 kg)   SpO2 97%   BMI 27.98 kg/m   Visual Acuity Right Eye Distance:   Left Eye Distance:   Bilateral Distance:    Right Eye Near:   Left Eye Near:    Bilateral Near:     Physical Exam  Constitutional: He is oriented to person, place, and time. He appears well-developed.  HENT:  Head: Normocephalic and atraumatic.  Right Ear: Hearing, tympanic membrane, external ear and ear canal normal.  Left Ear: Hearing, tympanic membrane, external ear and ear canal normal.  Nose: Mucosal edema present.  Mouth/Throat: Uvula is midline, oropharynx is clear and moist and mucous membranes are normal. No oral lesions.  Some mild swelling of the lower lip is present  Eyes: Pupils are equal, round, and reactive to light.  Neck: Normal range of motion. Neck supple.  Cardiovascular: Normal rate and regular rhythm.   Pulmonary/Chest: Effort normal and breath sounds normal.  Musculoskeletal: Normal range of motion.  Lymphadenopathy:    He has cervical adenopathy.  Neurological: He is alert and oriented to person, place, and time.  Skin:  Skin is warm.  Psychiatric: His affect is blunt. His speech is delayed. He is slowed.  Vitals reviewed.    UC Treatments / Results  Labs (all labs ordered are listed, but only abnormal results are displayed) Labs Reviewed  RAPID INFLUENZA A&B ANTIGENS ([facility] ONLY)    EKG  EKG Interpretation None       Radiology No results found.  Procedures Procedures (including critical care time)  Medications Ordered in UC Medications - No data to display  Results for  orders placed or performed during the hospital encounter of 03/17/16  Rapid Influenza A&B Antigens ([facility] only)  Result Value Ref Range   Influenza A ([facility]) NEGATIVE NEGATIVE   Influenza B ([facility]) NEGATIVE NEGATIVE   Initial Impression / Assessment and Plan / UC Course  I have reviewed the triage vital signs and the nursing notes.  Pertinent labs & imaging results that were available during my care of the patient were reviewed by me and considered in my medical decision making (see chart for details).   patient fill has had the flu but is beyond the scope of Tamiflu at this time. Causes exacerbation was cough will place on Z-Pak Tessalon Perles and Allegra-D please follow-up with PCP if not better in the next few days.  Final Clinical Impressions(s) / UC Diagnoses   Final diagnoses:  Upper respiratory tract infection, unspecified type  Influenza-like illness    New Prescriptions Discharge Medication List as of 03/17/2016 11:02 AM    START taking these medications   Details  azithromycin (ZITHROMAX Z-PAK) 250 MG tablet Take 2 tablets first day and then 1 po a day for 4 days, Normal    benzonatate (TESSALON) 200 MG capsule Take 1 capsule (200 mg total) by mouth 3 (three) times daily as needed., Starting Sat 03/17/2016, Normal    fexofenadine-pseudoephedrine (ALLEGRA-D ALLERGY & CONGESTION) 180-240 MG 24 hr tablet Take 1 tablet by mouth daily., Starting Sat 03/17/2016, Normal        Note: This dictation was  prepared with Dragon dictation along with smaller phrase technology. Any transcriptional errors that result from this process are unintentional.   Hassan RowanEugene Jania Steinke, MD 03/17/16 1109

## 2016-03-17 NOTE — Discharge Instructions (Signed)
Per conversation recommend flu shot . Please patient has had the flu but to  at this time too late for Tamiflu. If not improving follow-up with PCP in the very near future if not improving

## 2016-03-17 NOTE — ED Triage Notes (Signed)
Patient c/o cough, chest congestion, runny nose, bodyaches since Wed.

## 2016-06-25 DIAGNOSIS — E538 Deficiency of other specified B group vitamins: Secondary | ICD-10-CM | POA: Insufficient documentation

## 2017-07-02 DIAGNOSIS — Z0000 Encounter for general adult medical examination without abnormal findings: Secondary | ICD-10-CM | POA: Insufficient documentation

## 2018-06-27 DIAGNOSIS — I639 Cerebral infarction, unspecified: Secondary | ICD-10-CM

## 2018-06-27 HISTORY — DX: Cerebral infarction, unspecified: I63.9

## 2018-06-29 ENCOUNTER — Inpatient Hospital Stay (HOSPITAL_COMMUNITY)
Admission: EM | Admit: 2018-06-29 | Discharge: 2018-07-01 | DRG: 065 | Disposition: A | Discharge status: Home / Self Care | Payer: Medicare Other | Source: Other Acute Inpatient Hospital | Attending: Neurology | Admitting: Neurology

## 2018-06-29 ENCOUNTER — Encounter: Payer: Self-pay | Admitting: Emergency Medicine

## 2018-06-29 ENCOUNTER — Emergency Department
Admission: EM | Admit: 2018-06-29 | Discharge: 2018-06-29 | Disposition: A | Discharge status: Other Acute Inpatient Hospital | Payer: Medicare Other | Attending: Emergency Medicine | Admitting: Emergency Medicine

## 2018-06-29 ENCOUNTER — Other Ambulatory Visit: Payer: Self-pay

## 2018-06-29 ENCOUNTER — Emergency Department: Payer: Medicare Other

## 2018-06-29 DIAGNOSIS — Z79899 Other long term (current) drug therapy: Secondary | ICD-10-CM | POA: Insufficient documentation

## 2018-06-29 DIAGNOSIS — I10 Essential (primary) hypertension: Secondary | ICD-10-CM | POA: Diagnosis present

## 2018-06-29 DIAGNOSIS — R262 Difficulty in walking, not elsewhere classified: Secondary | ICD-10-CM | POA: Insufficient documentation

## 2018-06-29 DIAGNOSIS — I2510 Atherosclerotic heart disease of native coronary artery without angina pectoris: Secondary | ICD-10-CM | POA: Diagnosis present

## 2018-06-29 DIAGNOSIS — Z820 Family history of epilepsy and other diseases of the nervous system: Secondary | ICD-10-CM

## 2018-06-29 DIAGNOSIS — E785 Hyperlipidemia, unspecified: Secondary | ICD-10-CM | POA: Diagnosis present

## 2018-06-29 DIAGNOSIS — I6381 Other cerebral infarction due to occlusion or stenosis of small artery: Secondary | ICD-10-CM | POA: Diagnosis not present

## 2018-06-29 DIAGNOSIS — R29898 Other symptoms and signs involving the musculoskeletal system: Secondary | ICD-10-CM | POA: Diagnosis present

## 2018-06-29 DIAGNOSIS — R29701 NIHSS score 1: Secondary | ICD-10-CM | POA: Diagnosis not present

## 2018-06-29 DIAGNOSIS — G20A1 Parkinson's disease without dyskinesia, without mention of fluctuations: Secondary | ICD-10-CM | POA: Diagnosis present

## 2018-06-29 DIAGNOSIS — Z9282 Status post administration of tPA (rtPA) in a different facility within the last 24 hours prior to admission to current facility: Secondary | ICD-10-CM

## 2018-06-29 DIAGNOSIS — I63421 Cerebral infarction due to embolism of right anterior cerebral artery: Principal | ICD-10-CM | POA: Diagnosis present

## 2018-06-29 DIAGNOSIS — Z7982 Long term (current) use of aspirin: Secondary | ICD-10-CM | POA: Insufficient documentation

## 2018-06-29 DIAGNOSIS — Z955 Presence of coronary angioplasty implant and graft: Secondary | ICD-10-CM | POA: Diagnosis not present

## 2018-06-29 DIAGNOSIS — R29703 NIHSS score 3: Secondary | ICD-10-CM | POA: Diagnosis present

## 2018-06-29 DIAGNOSIS — G8314 Monoplegia of lower limb affecting left nondominant side: Secondary | ICD-10-CM | POA: Diagnosis present

## 2018-06-29 DIAGNOSIS — I361 Nonrheumatic tricuspid (valve) insufficiency: Secondary | ICD-10-CM | POA: Diagnosis not present

## 2018-06-29 DIAGNOSIS — E782 Mixed hyperlipidemia: Secondary | ICD-10-CM | POA: Diagnosis present

## 2018-06-29 DIAGNOSIS — Z87891 Personal history of nicotine dependence: Secondary | ICD-10-CM | POA: Diagnosis not present

## 2018-06-29 DIAGNOSIS — Z823 Family history of stroke: Secondary | ICD-10-CM

## 2018-06-29 DIAGNOSIS — G20 Parkinson's disease: Secondary | ICD-10-CM | POA: Diagnosis present

## 2018-06-29 DIAGNOSIS — I739 Peripheral vascular disease, unspecified: Secondary | ICD-10-CM | POA: Diagnosis present

## 2018-06-29 DIAGNOSIS — R29702 NIHSS score 2: Secondary | ICD-10-CM | POA: Diagnosis not present

## 2018-06-29 DIAGNOSIS — I639 Cerebral infarction, unspecified: Secondary | ICD-10-CM | POA: Diagnosis not present

## 2018-06-29 DIAGNOSIS — I2583 Coronary atherosclerosis due to lipid rich plaque: Secondary | ICD-10-CM | POA: Diagnosis not present

## 2018-06-29 DIAGNOSIS — R531 Weakness: Secondary | ICD-10-CM | POA: Diagnosis present

## 2018-06-29 HISTORY — DX: Parkinson's disease: G20

## 2018-06-29 HISTORY — DX: Atherosclerotic heart disease of native coronary artery without angina pectoris: I25.10

## 2018-06-29 HISTORY — DX: Hyperlipidemia, unspecified: E78.5

## 2018-06-29 HISTORY — DX: Parkinson's disease without dyskinesia, without mention of fluctuations: G20.A1

## 2018-06-29 LAB — URINALYSIS, ROUTINE W REFLEX MICROSCOPIC
Bacteria, UA: NONE SEEN
Bilirubin Urine: NEGATIVE
Glucose, UA: NEGATIVE mg/dL
Ketones, ur: NEGATIVE mg/dL
Leukocytes,Ua: NEGATIVE
Nitrite: NEGATIVE
Protein, ur: 30 mg/dL — AB
RBC / HPF: >50 RBC/hpf — ABNORMAL HIGH (ref 0–5)
Specific Gravity, Urine: 1.015 (ref 1.005–1.030)
Squamous Epithelial / HPF: NONE SEEN (ref 0–5)
pH: 6 (ref 5.0–8.0)

## 2018-06-29 LAB — ETHANOL: Alcohol, Ethyl (B): <10 mg/dL (ref <10)

## 2018-06-29 LAB — COMPREHENSIVE METABOLIC PANEL
ALT: 22 U/L (ref 0–44)
AST: 26 U/L (ref 15–41)
Albumin: 4.3 g/dL (ref 3.5–5.0)
Alkaline Phosphatase: 78 U/L (ref 38–126)
Anion gap: 9 (ref 5–15)
BUN: 19 mg/dL (ref 8–23)
CO2: 26 mmol/L (ref 22–32)
Calcium: 9 mg/dL (ref 8.9–10.3)
Chloride: 101 mmol/L (ref 98–111)
Creatinine, Ser: 1.31 mg/dL — ABNORMAL HIGH (ref 0.61–1.24)
GFR calc Af Amer: >60 mL/min (ref >60)
GFR calc non Af Amer: 52 mL/min — ABNORMAL LOW (ref >60)
Glucose, Bld: 108 mg/dL — ABNORMAL HIGH (ref 70–99)
Potassium: 4.2 mmol/L (ref 3.5–5.1)
Sodium: 136 mmol/L (ref 135–145)
Total Bilirubin: 0.9 mg/dL (ref 0.3–1.2)
Total Protein: 7.5 g/dL (ref 6.5–8.1)

## 2018-06-29 LAB — CBC WITH DIFFERENTIAL/PLATELET
Abs Immature Granulocytes: 0.03 10*3/uL (ref 0.00–0.07)
Basophils Absolute: 0 10*3/uL (ref 0.0–0.1)
Basophils Relative: 0 %
Eosinophils Absolute: 0.1 10*3/uL (ref 0.0–0.5)
Eosinophils Relative: 1 %
HCT: 48.9 % (ref 39.0–52.0)
Hemoglobin: 16.1 g/dL (ref 13.0–17.0)
Immature Granulocytes: 0 %
Lymphocytes Relative: 18 %
Lymphs Abs: 1.5 10*3/uL (ref 0.7–4.0)
MCH: 30 pg (ref 26.0–34.0)
MCHC: 32.9 g/dL (ref 30.0–36.0)
MCV: 91.2 fL (ref 80.0–100.0)
Monocytes Absolute: 0.9 10*3/uL (ref 0.1–1.0)
Monocytes Relative: 11 %
Neutro Abs: 5.8 10*3/uL (ref 1.7–7.7)
Neutrophils Relative %: 70 %
Platelets: 193 10*3/uL (ref 150–400)
RBC: 5.36 MIL/uL (ref 4.22–5.81)
RDW: 13.4 % (ref 11.5–15.5)
WBC: 8.3 10*3/uL (ref 4.0–10.5)
nRBC: 0 % (ref 0.0–0.2)

## 2018-06-29 LAB — URINE DRUG SCREEN, QUALITATIVE (ARMC ONLY)
Amphetamines, Ur Screen: NOT DETECTED
Barbiturates, Ur Screen: NOT DETECTED
Benzodiazepine, Ur Scrn: NOT DETECTED
Cannabinoid 50 Ng, Ur ~~LOC~~: NOT DETECTED
Cocaine Metabolite,Ur ~~LOC~~: NOT DETECTED
MDMA (Ecstasy)Ur Screen: NOT DETECTED
Methadone Scn, Ur: NOT DETECTED
Opiate, Ur Screen: NOT DETECTED
Phencyclidine (PCP) Ur S: NOT DETECTED
Tricyclic, Ur Screen: NOT DETECTED

## 2018-06-29 LAB — APTT: aPTT: 30 seconds (ref 24–36)

## 2018-06-29 LAB — PROTIME-INR
INR: 1 (ref 0.8–1.2)
Prothrombin Time: 13.5 seconds (ref 11.4–15.2)

## 2018-06-29 LAB — GLUCOSE, CAPILLARY: Glucose-Capillary: 89 mg/dL (ref 70–99)

## 2018-06-29 MED ORDER — ALTEPLASE (STROKE) FULL DOSE INFUSION
0.9000 mg/kg | Freq: Once | INTRAVENOUS | Status: AC
  Administered 2018-06-29: 78.4 mg via INTRAVENOUS
  Filled 2018-06-29: qty 100

## 2018-06-29 MED ORDER — ALTEPLASE (STROKE) FULL DOSE INFUSION: 0.9000 mg/kg | Freq: Once | INTRAVENOUS | Status: DC

## 2018-06-29 MED ORDER — SODIUM CHLORIDE 0.9 % IV SOLN
50.0000 mL | Freq: Once | INTRAVENOUS | Status: AC
  Administered 2018-06-29: 50 mL via INTRAVENOUS

## 2018-06-29 MED ORDER — ALTEPLASE 100 MG IV SOLR
INTRAVENOUS | Status: AC
  Administered 2018-06-29: 78.4 mg via INTRAVENOUS
  Filled 2018-06-29: qty 100

## 2018-06-29 MED ORDER — SODIUM CHLORIDE 0.9 % IV SOLN: 50.0000 mL | Freq: Once | INTRAVENOUS | Status: DC

## 2018-06-29 NOTE — Progress Notes (Signed)
Paged about a code stroke @ 5:47  pm. Karmen Stabs, RN to verify if they needed pharmacy- was told that they likely did not need it but would call the pharmacy if they did. However, shortly after neurology was consulted tPa was administered.

## 2018-06-29 NOTE — ED Notes (Signed)
Education provided by Teleneurologist Karolee Ohs and EDP Derrill Kay about TPA. Pt spoke to wife with MDs over speaker phone as well. Pt and wife agree to receive TPA. Pt actual weight 87.1kg. Order entered for 0.9mg /kg = total dose 78.4mg  with 10% loading dose of 7.8mg . 21.6mg  of TPA removed from vial and wasted prior to starting infusion. Medication reviewed on pump prior to starting infusion by Theophilus Bones, Gaffer, Charity fundraiser. Diannia Ruder, RN with teleneurology on camera also present for beginning of infusion. Infusion started 1823.

## 2018-06-29 NOTE — ED Notes (Signed)
Pt's wife called per pt request and notified of transfer to [facility] via [facility] and new room number.

## 2018-06-29 NOTE — ED Triage Notes (Signed)
Pt arrives with concerns over possible stroke due to a sudden onset of weakness to his left leg at 1400 today. Pt denies any need for mobility device at baseline but states "it hit me real quick."

## 2018-06-29 NOTE — ED Notes (Signed)
First Nurse Note: Pt to ED c/o being unable to use his left leg. Pt states that this started around 1400. Pt denies any other symptoms. No facial dropped noted in triage. Pt did have difficulty ambulating. Charge RN called and pt taken to room 17 for evaluation.

## 2018-06-29 NOTE — ED Provider Notes (Signed)
[facility] Emergency Department Provider Note  ____________________________________________   I have reviewed the triage vital signs and the nursing notes.   HISTORY  Chief Complaint Weakness   History limited by: Not Limited   HPI Jeffery Strong is a 78 y.o. male who presents to the emergency department today because of left leg weakness. The patient states that it started at 2:00pm. It came on suddenly. He did notice that he was having a hard time walking. 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   Records reviewed. Per medical record review patient has a history of hypertension, parkinson's disease.   Past Medical History:  Diagnosis Date  . Hypertension   . Parkinson disease (HCC)    right side per pt report    There are no active problems to display for this patient.   Past Surgical History:  Procedure Laterality Date  . Cardiac Stents    . COLONOSCOPY WITH PROPOFOL N/A 12/23/2015   Procedure: COLONOSCOPY WITH PROPOFOL;  Surgeon: Scot Junobert T Elliott, MD;  [facility];  Service: Endoscopy;  Laterality: N/A;  . ESOPHAGOGASTRODUODENOSCOPY (EGD) WITH PROPOFOL N/A 12/23/2015   Procedure: ESOPHAGOGASTRODUODENOSCOPY (EGD) WITH PROPOFOL;  Surgeon: Scot Junobert T Elliott, MD;  [facility];  Service: Endoscopy;  Laterality: N/A;    Prior to Admission medications   Medication Sig Start Date End Date Taking? Authorizing Provider  aspirin 325 MG tablet Take 325 mg by mouth daily.    [provider]  atorvastatin (LIPITOR) 20 MG tablet Take 20 mg by mouth daily at 6 PM.    [provider]  azithromycin (ZITHROMAX Z-PAK) 250 MG tablet Take 2 tablets first day and then 1 po a day for 4 days 03/17/16   Hassan RowanWade, Eugene, MD  benzonatate (TESSALON) 200 MG capsule Take 1 capsule (200 mg total) by mouth 3 (three) times daily as needed. 03/17/16   Hassan RowanWade, Eugene, MD  diclofenac  (CATAFLAM) 50 MG tablet Take 50 mg by mouth 2 (two) times daily.    [provider]  fexofenadine-pseudoephedrine (ALLEGRA-D ALLERGY & CONGESTION) 180-240 MG 24 hr tablet Take 1 tablet by mouth daily. 03/17/16   Hassan RowanWade, Eugene, MD  gabapentin (NEURONTIN) 300 MG capsule Take 300 mg by mouth 2 (two) times daily.    [provider]  losartan-hydrochlorothiazide (HYZAAR) 100-12.5 MG tablet Take 1 tablet by mouth daily.    [provider]  metoprolol succinate (TOPROL-XL) 25 MG 24 hr tablet Take 25 mg by mouth daily.    [provider]  selegiline (ELDEPRYL) 5 MG tablet Take 5 mg by mouth daily before breakfast.    [provider]  traZODone (DESYREL) 50 MG tablet Take 50 mg by mouth at bedtime.    [provider]    Allergies Imipramine hcl  No family history on file.  Social History Social History   Tobacco Use  . Smoking status: Former Games developermoker  . Smokeless tobacco: Never Used  Substance Use Topics  . Alcohol use: No  . Drug use: Not on file    Review of Systems Constitutional: No fever/chills Eyes: No visual changes. ENT: No sore throat. Cardiovascular: Denies chest pain. Respiratory: Denies shortness of breath. Gastrointestinal: No abdominal pain.  No nausea, no vomiting.  No diarrhea.   Genitourinary: Negative for dysuria. Musculoskeletal: Negative for back pain. Skin: Negative for rash. Neurological: Positive for left sided leg weakness.  ____________________________________________   PHYSICAL EXAM:  VITAL SIGNS:  ED Triage Vitals  Enc Vitals Group     BP 06/29/18 1735 139/82     Pulse Rate 06/29/18 1734 77     Resp 06/29/18 1734 16     Temp 06/29/18 1735 98.5 F (36.9 C)     Temp Source 06/29/18 1735 Oral     SpO2 06/29/18 1734 97 %     Weight 06/29/18 1732 190 lb (86.2 kg)     Height 06/29/18 1732 5\' 10"  (1.778 m)     Head Circumference --      Peak Flow --      Pain Score 06/29/18 1732 0   Constitutional:  Alert and oriented.  Eyes: Conjunctivae are normal.  ENT      Head: Normocephalic and atraumatic.      Nose: No congestion/rhinnorhea.      Mouth/Throat: Mucous membranes are moist.      Neck: No stridor. Hematological/Lymphatic/Immunilogical: No cervical lymphadenopathy. Cardiovascular: Normal rate, regular rhythm.   Respiratory: Normal respiratory effort without tachypnea nor retractions.  Gastrointestinal: Soft and non tender. No rebound. No guarding.  Genitourinary: Deferred Musculoskeletal:  No lower extremity edema. Neurologic:  Left lower leg weakness. Skin:  Skin is warm, dry and intact. No rash noted.  ____________________________________________    LABS (pertinent positives/negatives)  INR 1.0 CBC wbc 8.3, hgb 16.1, plt 193 CMP wnl except glu 108, cr 1.31, gfr 52 UA clear,, large hgb dipstick, rbc >50 ____________________________________________   EKG  I, Phineas Semen, attending physician, personally viewed and interpreted this EKG  EKG Time: 1735 Rate: 69 Rhythm: sinus rhythm  Axis: left axis deviation Intervals: qtc 399 QRS: narrow, q waves v1 ST changes: no st elevation Impression: abnormal ekg ____________________________________________    RADIOLOGY  CT head No acute findings  ____________________________________________   PROCEDURES  Procedures  ____________________________________________   INITIAL IMPRESSION / ASSESSMENT AND PLAN / ED COURSE  Pertinent labs & imaging results that were available during my care of the patient were reviewed by me and considered in my medical decision making (see chart for details).   Patient presented to the emergency department today because of concerns for left lower leg weakness.  Symptoms started around 2 PM today.  Patient was called a code stroke.  Patient was seen by tele-neurology who did recommend TPA.  Both tele-neurology and myself had a discussion with the patient about risk and benefits,  risks including death.  Patient did elect to proceed with TPA.  Did start getting some resolution of symptoms.  Discussed with hospitalist for admission here however after they discussed with ICU doc since we do not have neurology in house ICU doctor felt more comfortable having the patient transferred to [facility].  Discussed with Dr. Amada Jupiter at [facility] for admission.   ____________________________________________   FINAL CLINICAL IMPRESSION(S) / ED DIAGNOSES  Final diagnoses:  Left leg weakness     Note: This dictation was prepared with Dragon dictation. Any transcriptional errors that result from this process are unintentional     Phineas Semen, MD 06/29/18 2251

## 2018-06-29 NOTE — Consult Note (Signed)
[facility] TeleNeurology Consult Services   Date of Service:   06/29/2018 17:52:06  Impression:     .  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 Derrill KayGoodman. 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 Keeping in mind his history of Parkinson disease, I think this is a disabling deficit. I discussed IV TPA with him. We talked in detail about pros and cons benefits and risks of bleeding was discussed with the patient and his wife in detail. It took him some time to make a decision but finally once they agreed, the TPA was administered without any complications. I would recommend admitting him to the hospital ICU and follow-up with neurology inpatient. He should get MRI of the head, MRA of the head and neck, echocardiogram and further stroke work-up. He would need close neuro monitoring.  Mechanism of Stroke: Small Vessel Disease  Metrics: Last Known Well: 06/29/2018 14:00:00 [facility] Notification Time: 06/29/2018 17:52:06 Arrival Time: 06/29/2018 17:27:00 Stamp Time: 06/29/2018 17:52:06 Time First Login Attempt: 06/29/2018 17:56:36 Video Start Time: 06/29/2018 17:56:36  Symptoms: left leg weakness NIHSS Start Assessment Time: 06/29/2018 18:00:19 tPA Verbal Order Time: 06/29/2018 18:14:54 Patient is a candidate for tPA. tPA CPOE Order Time: 06/29/2018 18:14:58 Needle Time: 06/29/2018 18:22:58 Weight Noted by Staff: 87 kg Video End Time: 06/29/2018 18:25:53 Reason for tPA Delay: Delays related to tPA Administration  CT head showed no acute hemorrhage or acute core infarct.  Clinical Presentation is not Suggestive of Large Vessel Occlusive Disease  ED Physician notified of diagnostic impression and management plan on 06/29/2018 18:12:02  Verbal Consent to tPA: I have  explained to the Patient the nature of the patient's condition, the use of tPA fibrinolytic agent, and the benefits to be reasonably expected compared with alternative approaches. I have discussed the likelihood of major risks or complications of this procedure including (if applicable) but not limited to loss of limb function, brain damage, paralysis, hemorrhage, infection, complications from transfusion of blood components, drug reactions, blood clots and loss of life. I have also indicated that with any procedure there is always the possibility of an unexpected complication. All questions were answered and Patient express understanding of the treatment plan and consent to the treatment.  Our recommendations are outlined below.  Recommendations: IV tPA recommended.  tPA bolus given Without Complication.   IV tPA Total Dose - 78.3 mg IV tPA Bolus Dose - 7.8 mg IV tPA Infusion Dose - 70.5 mg  Routine post tPA monitoring including neuro checks and blood pressure control during/after treatment Monitor blood pressure Check blood pressure and NIHSS every 15 min for 2 h, then every 30 min for 6 h, and finally every hour for 16 h.  Manage Blood Pressure per post tPA protocol.      .  Admission to ICU     .  CT brain 24 hours post tPA     .  NPO until swallowing screen performed and passed     .  No antiplatelet agents or anticoagulants (including heparin for DVT prophylaxis) in first 24 hours     .  No Foley catheter, nasogastric tube, arterial catheter or central venous catheter for 24 hr, unless absolutely necessary     .  Telemetry     .  Bedside swallow evaluation     .  HOB less than 30 degrees     .  Euglycemia     .  Avoid hyperthermia, PRN acetaminophen     .  DVT prophylaxis     .  Inpatient Neurology Consultation     .  Stroke evaluation as per inpatient neurology recommendations  Discussed with ED  physician    ------------------------------------------------------------------------------  History of Present Illness: Patient is a 78 year old Male.  Patient was brought by private transportation with symptoms of left leg weakness  Pleasant 78 year old male with past medical history of hypertension and Parkinson disease came to the hospital because of left leg weakness. The symptoms started around 2 PM. He noticed that his left leg suddenly got weak. He also had some numbness in the left leg and left arm. There was no problem with his speech or swallowing reported. He did not notice any issues with his vision. The numbness and weakness in the left leg persisted and he finally came to the hospital and stroke alert was called.  Last seen normal was within 4.5 hours. There is no history of hemorrhagic complications or intracranial hemorrhage. There is no history of Recent Anticoagulants. There is no history of recent major surgery. There is no history of recent stroke.  Examination: BP(139/82), Pulse(69), Blood Glucose(89) 1A: Level of Consciousness - Alert; keenly responsive + 0 1B: Ask Month and Age - Both Questions Right + 0 1C: Blink Eyes & Squeeze Hands - Performs Both Tasks + 0 2: Test Horizontal Extraocular Movements - Normal + 0 3: Test Visual Fields - No Visual Loss + 0 4: Test Facial Palsy (Use Grimace if Obtunded) - Normal symmetry + 0 5A: Test Left Arm Motor Drift - No Drift for 10 Seconds + 0 5B: Test Right Arm Motor Drift - No Drift for 10 Seconds + 0 6A: Test Left Leg Motor Drift - No Effort Against Gravity + 3 6B: Test Right Leg Motor Drift - No Drift for 5 Seconds + 0 7: Test Limb Ataxia (FNF/Heel-Shin) - No Ataxia + 0 8: Test Sensation - Mild-Moderate Loss: Less Sharp/More Dull + 1 9: Test Language/Aphasia - Normal; No aphasia + 0 10: Test Dysarthria - Normal + 0 11: Test Extinction/Inattention - No abnormality + 0  NIHSS Score: 4  Patient/Family was informed  the Neurology Consult would happen via TeleHealth consult by way of interactive audio and video telecommunications and consented to receiving care in this manner.  Due to the immediate potential for life-threatening deterioration due to underlying acute neurologic illness, I spent 35 minutes providing critical care. This time includes time for face to face visit via telemedicine, review of medical records, imaging studies and discussion of findings with providers, the patient and/or family.   Dr Hedy Camara   [facility] [phone]  Case [id number]

## 2018-06-30 ENCOUNTER — Inpatient Hospital Stay (HOSPITAL_COMMUNITY): Payer: Medicare Other

## 2018-06-30 DIAGNOSIS — I10 Essential (primary) hypertension: Secondary | ICD-10-CM

## 2018-06-30 DIAGNOSIS — F0281 Dementia in other diseases classified elsewhere with behavioral disturbance: Secondary | ICD-10-CM

## 2018-06-30 DIAGNOSIS — I6381 Other cerebral infarction due to occlusion or stenosis of small artery: Secondary | ICD-10-CM | POA: Diagnosis present

## 2018-06-30 DIAGNOSIS — I63421 Cerebral infarction due to embolism of right anterior cerebral artery: Principal | ICD-10-CM

## 2018-06-30 DIAGNOSIS — I639 Cerebral infarction, unspecified: Secondary | ICD-10-CM

## 2018-06-30 DIAGNOSIS — E785 Hyperlipidemia, unspecified: Secondary | ICD-10-CM

## 2018-06-30 DIAGNOSIS — I361 Nonrheumatic tricuspid (valve) insufficiency: Secondary | ICD-10-CM

## 2018-06-30 DIAGNOSIS — G20 Parkinson's disease: Secondary | ICD-10-CM

## 2018-06-30 LAB — CBC
HCT: 43.9 % (ref 39.0–52.0)
Hemoglobin: 14.4 g/dL (ref 13.0–17.0)
MCH: 30.2 pg (ref 26.0–34.0)
MCHC: 32.8 g/dL (ref 30.0–36.0)
MCV: 92 fL (ref 80.0–100.0)
Platelets: 162 10*3/uL (ref 150–400)
RBC: 4.77 MIL/uL (ref 4.22–5.81)
RDW: 13.5 % (ref 11.5–15.5)
WBC: 7.1 10*3/uL (ref 4.0–10.5)
nRBC: 0 % (ref 0.0–0.2)

## 2018-06-30 LAB — HEMOGLOBIN A1C
Hgb A1c MFr Bld: 5.7 % — ABNORMAL HIGH (ref 4.8–5.6)
Mean Plasma Glucose: 116.89 mg/dL

## 2018-06-30 LAB — LIPID PANEL
Cholesterol: 109 mg/dL (ref 0–200)
HDL: 37 mg/dL — ABNORMAL LOW (ref >40)
LDL Cholesterol: 64 mg/dL (ref 0–99)
Total CHOL/HDL Ratio: 2.9 RATIO
Triglycerides: 42 mg/dL (ref <150)
VLDL: 8 mg/dL (ref 0–40)

## 2018-06-30 LAB — MRSA PCR SCREENING: MRSA by PCR: NEGATIVE

## 2018-06-30 LAB — GLUCOSE, CAPILLARY
Glucose-Capillary: 110 mg/dL — ABNORMAL HIGH (ref 70–99)
Glucose-Capillary: 107 mg/dL — ABNORMAL HIGH (ref 70–99)
Glucose-Capillary: 96 mg/dL (ref 70–99)

## 2018-06-30 LAB — ECHOCARDIOGRAM LIMITED
Height: 70 in
Weight: 3072 oz

## 2018-06-30 MED ORDER — INSULIN ASPART 100 UNIT/ML ~~LOC~~ SOLN: 0.0000 [IU] | Freq: Three times a day (TID) | SUBCUTANEOUS | Status: DC

## 2018-06-30 MED ORDER — TRAZODONE HCL 50 MG PO TABS
50.0000 mg | ORAL_TABLET | Freq: Every day | ORAL | Status: DC
  Administered 2018-06-30: 50 mg via ORAL
  Filled 2018-06-30: qty 1

## 2018-06-30 MED ORDER — GABAPENTIN 300 MG PO CAPS
300.0000 mg | ORAL_CAPSULE | Freq: Two times a day (BID) | ORAL | Status: DC
  Administered 2018-06-30 – 2018-07-01 (×3): 300 mg via ORAL
  Filled 2018-06-30 (×3): qty 1

## 2018-06-30 MED ORDER — ATORVASTATIN CALCIUM 10 MG PO TABS
20.0000 mg | ORAL_TABLET | Freq: Every day | ORAL | Status: DC
  Administered 2018-06-30: 20 mg via ORAL
  Filled 2018-06-30: qty 2

## 2018-06-30 MED ORDER — NICARDIPINE HCL IN NACL 20-0.86 MG/200ML-% IV SOLN
0.0000 mg/h | INTRAVENOUS | Status: DC | PRN
  Filled 2018-06-30: qty 200

## 2018-06-30 MED ORDER — SELEGILINE HCL 5 MG PO TABS
5.0000 mg | ORAL_TABLET | Freq: Two times a day (BID) | ORAL | Status: DC
  Administered 2018-06-30 – 2018-07-01 (×3): 5 mg via ORAL
  Filled 2018-06-30 (×4): qty 1

## 2018-06-30 MED ORDER — ACETAMINOPHEN 160 MG/5ML PO SOLN: 650.0000 mg | ORAL | Status: DC | PRN

## 2018-06-30 MED ORDER — STROKE: EARLY STAGES OF RECOVERY BOOK
Freq: Once | Status: AC
  Administered 2018-06-30: 01:00:00

## 2018-06-30 MED ORDER — PANTOPRAZOLE SODIUM 40 MG PO TBEC
40.0000 mg | DELAYED_RELEASE_TABLET | Freq: Every day | ORAL | Status: DC
  Administered 2018-06-30 – 2018-07-01 (×2): 40 mg via ORAL
  Filled 2018-06-30 (×2): qty 1

## 2018-06-30 MED ORDER — HYDRALAZINE HCL 20 MG/ML IJ SOLN: 5.0000 mg | INTRAMUSCULAR | Status: DC | PRN

## 2018-06-30 MED ORDER — ACETAMINOPHEN 650 MG RE SUPP: 650.0000 mg | RECTAL | Status: DC | PRN

## 2018-06-30 MED ORDER — IOHEXOL 350 MG/ML SOLN
75.0000 mL | Freq: Once | INTRAVENOUS | Status: AC | PRN
  Administered 2018-06-30: 75 mL via INTRAVENOUS

## 2018-06-30 MED ORDER — PANTOPRAZOLE SODIUM 40 MG IV SOLR
40.0000 mg | Freq: Every day | INTRAVENOUS | Status: DC
  Administered 2018-06-30: 40 mg via INTRAVENOUS
  Filled 2018-06-30: qty 40

## 2018-06-30 MED ORDER — ASPIRIN EC 81 MG PO TBEC
81.0000 mg | DELAYED_RELEASE_TABLET | Freq: Every day | ORAL | Status: DC
  Administered 2018-06-30 – 2018-07-01 (×2): 81 mg via ORAL
  Filled 2018-06-30 (×2): qty 1

## 2018-06-30 MED ORDER — SODIUM CHLORIDE 0.9 % IV SOLN
50.0000 mL/h | INTRAVENOUS | Status: DC
  Administered 2018-06-30: 50 mL/h via INTRAVENOUS

## 2018-06-30 MED ORDER — LABETALOL HCL 5 MG/ML IV SOLN: 10.0000 mg | Freq: Once | INTRAVENOUS | Status: DC | PRN

## 2018-06-30 MED ORDER — METOPROLOL SUCCINATE ER 25 MG PO TB24: 25.0000 mg | ORAL_TABLET | Freq: Every day | ORAL | Status: DC

## 2018-06-30 MED ORDER — ACETAMINOPHEN 325 MG PO TABS: 650.0000 mg | ORAL_TABLET | ORAL | Status: DC | PRN

## 2018-06-30 MED ORDER — CLOPIDOGREL BISULFATE 75 MG PO TABS
75.0000 mg | ORAL_TABLET | Freq: Every day | ORAL | Status: DC
  Administered 2018-06-30 – 2018-07-01 (×2): 75 mg via ORAL
  Filled 2018-06-30 (×2): qty 1

## 2018-06-30 NOTE — Evaluation (Signed)
Physical Therapy Evaluation Patient Details Name: Jeffery Strong MRN: [id number] DOB: January 09, 1941 Today's Date: 06/30/2018   History of Present Illness  78 year old male with acute ischemic infarct that is now improving after IV TPA  Clinical Impression  Patient seen for therapy assessment.  Mobilizing well with no significant focal deficits at this time. Educated patient on BEFAST signs. No further acute PT needs. Will sign off.   Follow Up Recommendations No PT follow up;Supervision - Intermittent    Equipment Recommendations  None recommended by PT    Recommendations for Other Services       Precautions / Restrictions Precautions Precautions: Fall Precaution Comments: h/o parkinson's disease       Mobility  Bed Mobility Overal bed mobility: Needs Assistance Bed Mobility: Supine to Sit     Supine to sit: Supervision        Transfers Overall transfer level: Needs assistance Equipment used: None Transfers: Sit to/from [employer] Transfers Sit to Stand: Supervision Stand pivot transfers: Min guard       General transfer comment: for pivot, min guard initially during transitional movements from bed to chair, 3/4 elevation to upright. Supervision for descent to chair  Ambulation/Gait Ambulation/Gait assistance: Supervision;Min guard Gait Distance (Feet): 620 Feet Assistive device: None Gait Pattern/deviations: Step-through pattern;Decreased stride length;Drifts right/left Gait velocity: decreased Gait velocity interpretation: <1.8 ft/sec, indicate of risk for recurrent falls General Gait Details: some instability noted with compounding tasks, able to self correct without physical assist  Stairs Stairs: Yes Stairs assistance: Min guard Stair Management: One rail Right;Alternating pattern Number of Stairs: 4 General stair comments: min guard for safety  Wheelchair Mobility    Modified Rankin (Stroke Patients Only) Modified Rankin (Stroke Patients  Only) Pre-Morbid Rankin Score: No symptoms Modified Rankin: Slight disability     Balance Overall balance assessment: Needs assistance   Sitting balance-Leahy Scale: Good       Standing balance-Leahy Scale: Good Standing balance comment: good static balance and righting reactions in standing             High level balance activites: Backward walking;Direction changes;Turns;Head turns High Level Balance Comments: patient with noted instbaility during higher level balance tasks but patient was able to self correct without physical assist             Pertinent Vitals/Pain Pain Assessment: No/denies pain    Home Living Family/patient expects to be discharged to:: Private residence Living Arrangements: Spouse/significant other Available Help at Discharge: Family;Available 24 hours/day Type of Home: House Home Access: Stairs to enter Entrance Stairs-Rails: Can reach both;Left;Right Entrance Stairs-Number of Steps: 5 Home Layout: One level Home Equipment: Shower seat - built in      Prior Function Level of Independence: Independent         Comments: Pt reports h/o falls 1-2 years ago.  He works at a [employer] 3-4 days/week.  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   Dominant Hand: Right    Extremity/Trunk Assessment        Lower Extremity Assessment Lower Extremity Assessment: LLE deficits/detail LLE Deficits / Details: modest assymetrical deficits noted grossly 4/5 LLE Sensation: decreased light touch       Communication   Communication: No difficulties  Cognition Arousal/Alertness: Awake/alert Behavior During Therapy: WFL for tasks assessed/performed Overall Cognitive Status: Impaired/Different from baseline Area of Impairment: Attention                   Current  Attention Level: Alternating         Problem Solving: Requires verbal cues;Slow processing General Comments: Patient demonstrates some difficulties with  stopping motor tasks intermittently when attempting dual cognitive/mobility tasks. Modest cues at times with some delay processing but overall functional. At times, limited initiation in task but unclear if this is cogntivie or patient's personality (ie holding a comb for several moments before engaging in use)      General Comments      Exercises     Assessment/Plan    PT Assessment Patent does not need any further PT services  PT Problem List         PT Treatment Interventions      PT Goals (Current goals can be found in the Care Plan section)  Acute Rehab PT Goals Patient Stated Goal: to go home PT Goal Formulation: All assessment and education complete, DC therapy    Frequency     Barriers to discharge        Co-evaluation PT/OT/SLP Co-Evaluation/Treatment: Yes Reason for Co-Treatment: Complexity of the patient's impairments (multi-system involvement);For patient/therapist safety;To address functional/ADL transfers;Necessary to address cognition/behavior during functional activity PT goals addressed during session: Mobility/safety with mobility OT goals addressed during session: ADL's and self-care       AM-PAC PT "6 Clicks" Mobility  Outcome Measure Help needed turning from your back to your side while in a flat bed without using bedrails?: None Help needed moving from lying on your back to sitting on the side of a flat bed without using bedrails?: None Help needed moving to and from a bed to a chair (including a wheelchair)?: None Help needed standing up from a chair using your arms (e.g., wheelchair or bedside chair)?: A Little Help needed to walk in hospital room?: A Little Help needed climbing 3-5 steps with a railing? : A Little 6 Click Score: 21    End of Session Equipment Utilized During Treatment: Gait belt Activity Tolerance: Patient tolerated treatment well Patient left: in chair;with call bell/phone within reach;with chair alarm set Nurse  Communication: Mobility status PT Visit Diagnosis: Other symptoms and signs involving the nervous system (R29.898)    Time: [id number] PT Time Calculation (min) (ACUTE ONLY): 26 min   Charges:   PT Evaluation $PT Eval Moderate Complexity: 1 Mod          Charlotte Crumbevon Azaliyah Kennard, PT DPT  Board Certified Neurologic Specialist Acute Rehabilitation Services Pager [phone] Office [phone]   Fabio AsaDevon J Roniel Halloran 06/30/2018, 12:31 PM

## 2018-06-30 NOTE — Progress Notes (Signed)
PT Cancellation Note  Patient Details Name: Jeffery Strong MRN: [id number] DOB: 05-08-1940   Cancelled Treatment:    Reason Eval/Treat Not Completed: Patient not medically ready;Active bedrest order   Fabio Asa 06/30/2018, 7:47 AM Charlotte Crumb, PT DPT  Board Certified Neurologic Specialist Acute Rehabilitation Services Pager [phone] Office [phone]

## 2018-06-30 NOTE — H&P (Addendum)
Neurology H&P  CC: Leg weakness  History is obtained from: Patient  HPI: Jeffery Strong is a 78 y.o. male with a history of hypertension, hyperlipidemia who presents with left leg weakness that started abruptly at 2 PM.  He was walking and suddenly his left leg was not lifting up as well.  He tried to "walk it off" but it kept getting worse and worse.  This prompted him to go to the emergency department at [facility] where tele-neurology administered IV TPA.  Due to no onsite neurology, there is some discomfort with admitting him to [facility] and therefore he has been transferred to [facility] for further care.  He does have a history of Parkinson's disease and is on selegiline for this.  LKW: 2 PM tpa given?:  Yes IR Thrombectomy? No, mild symptoms Modified Rankin Scale: 1-No significant post stroke disability and can perform usual duties with stroke symptoms NIHSS:    ROS: A complete ROS was performed and is negative except as noted in the HPI.   Past Medical History:  Diagnosis Date  . CAD (coronary artery disease)   . HLD (hyperlipidemia)   . Hypertension   . Parkinson disease (HCC)    right side per pt report     Family History  Problem Relation Age of Onset  . Cancer Mother   . Stroke Mother   . Stroke Father   . Parkinsonism Father   . Cancer Brother   . Alzheimer's disease Sister      Social History:  reports that he has quit smoking. He has never used smokeless tobacco. He reports that he does not drink alcohol. No history on file for drug.   Prior to Admission medications   Medication Sig Start Date End Date Taking? Authorizing Provider  aspirin 325 MG tablet Take 325 mg by mouth daily.    [provider]  atorvastatin (LIPITOR) 20 MG tablet Take 20 mg by mouth daily at 6 PM.    [provider]  gabapentin (NEURONTIN) 300 MG capsule Take 300 mg by mouth 2 (two) times daily.    [provider]  metoprolol succinate  (TOPROL-XL) 25 MG 24 hr tablet Take 25 mg by mouth daily.    [provider]  olmesartan (BENICAR) 20 MG tablet Take 10 mg by mouth at bedtime. 12/11/17 12/11/18  [provider]  selegiline (ELDEPRYL) 5 MG tablet Take 5 mg by mouth 2 (two) times daily.     [provider]  traZODone (DESYREL) 50 MG tablet Take 50 mg by mouth at bedtime.    [provider]     Exam: Current vital signs: BP (!) 150/90   Pulse 60   Temp 97.6 F (36.4 C) (Oral)   Resp 17   Ht 5\' 10"  (1.778 m)   SpO2 95%   BMI 27.55 kg/m    Physical Exam  Constitutional: Appears well-developed and well-nourished.  Psych: Affect appropriate to situation Eyes: No scleral injection HENT: No OP obstrucion Head: Normocephalic.  Cardiovascular: Normal rate and regular rhythm.  Respiratory: Effort normal and breath sounds normal to anterior ascultation GI: Soft.  No distension. There is no tenderness.  Skin: WDI  Neuro: Mental Status: Patient is awake, alert, oriented to person, place, month, year, and situation. Patient is able to give a clear and coherent history. No signs of aphasia or neglect Cranial Nerves: II: Visual Fields are full. Pupils are equal, round, and reactive to light.   III,IV, VI:  EOMI without ptosis or diploplia.  V: Facial sensation is symmetric to temperature VII: Facial movement is symmetric.  VIII: hearing is intact to voice X: Uvula elevates symmetrically XI: Shoulder shrug is symmetric. XII: tongue is midline without atrophy or fasciculations.  Motor: Tone is normal. Bulk is normal. 5/5 strength was present in bilateral arms and right leg, he has 4/5 weakness of the left leg Sensory: Sensation is symmetric to light touch and temperature in the arms and legs. Deep Tendon Reflexes: 1+ and symmetric in patellae.  Cerebellar: No clear ataxia  I have reviewed labs in epic and the pertinent results are: CMP-borderline creatinine at 1.31  I have  reviewed the images obtained: CT head-no acute findings  Primary Diagnosis:  Cerebral infarction, unspecified.  Secondary Diagnosis: Essential (primary) hypertension   Impression: 78 year old male with acute ischemic infarct that is now improving after IV TPA.  I suspect small vessel disease, the ACA territory infarct is possible.  With his mild symptoms, I would not consider him an interventional candidate at this time.  He is being admitted for routine post TPA care as well as secondary risk factor modification and therapy.  Plan: - HgbA1c, fasting lipid panel - MRI of the brain without contrast - Frequent neuro checks - Echocardiogram - CTA head and neck - Prophylactic therapy-Antiplatelet med: Aspirin - dose 325mg  PO or 300mg  PR - Risk factor modification - Telemetry monitoring - PT consult, OT consult, Speech consult - Stroke team to follow  parkinsons disease-continue home selegiline   This patient is critically ill and at significant risk of neurological worsening, death and care requires constant monitoring of vital signs, hemodynamics,respiratory and cardiac monitoring, neurological assessment, discussion with family, other specialists and medical decision making of high complexity. I spent 40 minutes of neurocritical care time  in the care of  this patient. This was time spent independent of any time provided by nurse practitioner or PA.    Ritta Slot, MD [facility] [phone]  If 7pm- 7am, please page neurology on call as listed in AMION.

## 2018-06-30 NOTE — Progress Notes (Signed)
  Echocardiogram 2D Echocardiogram has been performed.  Delcie Roch 06/30/2018, 10:59 AM

## 2018-06-30 NOTE — Progress Notes (Signed)
Pt. Calm and resting on unit, no complaints of pain and improvements on NIH. Explained visitation policy and plan of care.   2300 & 2330 NIH missing d/t transfer from [facility] to [facility].   Will continue to monitor.

## 2018-06-30 NOTE — Progress Notes (Signed)
STROKE TEAM PROGRESS NOTE   INTERVAL HISTORY Patient lying in bed, RN at bedside.  Still has left leg weakness, but stated that improved from yesterday, however not 100% yet.  Pending MRI  Vitals:   06/30/18 0700 06/30/18 0730 06/30/18 0800 06/30/18 0853  BP: (!) 157/78 (!) 148/78 (!) 141/83   Pulse: (!) 56 (!) 57 (!) 55   Resp: Temp:    98.2 F (36.8 C)  TempSrc:    Oral  SpO2: 95% 94% 94%   Height:        CBC:  Recent Labs  Lab 06/29/18 1735 06/30/18 0400  WBC 8.3 7.1  NEUTROABS 5.8  --   HGB 16.1 14.4  HCT 48.9 43.9  MCV 91.2 92.0  PLT 193 162    Basic Metabolic Panel:  Recent Labs  Lab 06/29/18 1735  NA 136  K 4.2  CL 101  CO2 26  GLUCOSE 108*  BUN 19  CREATININE 1.31*  CALCIUM 9.0   Lipid Panel:     Component Value Date/Time   CHOL 109 06/30/2018 0400   TRIG 42 06/30/2018 0400   HDL 37 (L) 06/30/2018 0400   CHOLHDL 2.9 06/30/2018 0400   VLDL 8 06/30/2018 0400   LDLCALC 64 06/30/2018 0400   HgbA1c:  Lab Results  Component Value Date   HGBA1C 5.7 (H) 06/30/2018   Urine Drug Screen:     Component Value Date/Time   LABOPIA NONE DETECTED 06/29/2018 2044   COCAINSCRNUR NONE DETECTED 06/29/2018 2044   LABBENZ NONE DETECTED 06/29/2018 2044   AMPHETMU NONE DETECTED 06/29/2018 2044   THCU NONE DETECTED 06/29/2018 2044   LABBARB NONE DETECTED 06/29/2018 2044    Alcohol Level     Component Value Date/Time   ETH <10 06/29/2018 1735    IMAGING Ct Angio Head W Or Wo Contrast  Result Date: 06/30/2018 CLINICAL DATA:  78 y/o  M; Stroke, follow up.  Left leg weakness. EXAM: CT ANGIOGRAPHY HEAD AND NECK TECHNIQUE: Multidetector CT imaging of the head and neck was performed using the standard protocol during bolus administration of intravenous contrast. Multiplanar CT image reconstructions and MIPs were obtained to evaluate the vascular anatomy. Carotid stenosis measurements (when applicable) are obtained utilizing NASCET criteria, using the  distal internal carotid diameter as the denominator. CONTRAST:  75mL OMNIPAQUE IOHEXOL 350 MG/ML SOLN COMPARISON:  06/29/2018 CT head.  04/07/2012 carotid ultrasound. FINDINGS: CTA NECK FINDINGS Aortic arch: Standard branching. Imaged portion shows no evidence of aneurysm or dissection. No significant stenosis of the major arch vessel origins. Mild aortic calcific atherosclerosis. Right carotid system: No evidence of dissection, stenosis (50% or greater) or occlusion. Non stenotic mild calcified plaque of carotid bifurcation. Left carotid system: No evidence of dissection, stenosis (50% or greater) or occlusion. Non stenotic mild calcified plaque carotid bifurcation. Vertebral arteries: Left dominant. No evidence of dissection, stenosis (50% or greater) or occlusion. Skeleton: No acute finding. C6-7 grade 1 anterolisthesis. C7-T2 discogenic degenerative changes with loss of disc space height and endplate sclerosis. Predominant right-sided facet arthropathy. No high-grade bony spinal canal stenosis. Other neck: Negative. Upper chest: Negative. Review of the MIP images confirms the above findings CTA HEAD FINDINGS Anterior circulation: No significant stenosis, proximal occlusion, aneurysm, or vascular malformation. Non stenotic calcific atherosclerosis of carotid siphons. Posterior circulation: No significant stenosis, proximal occlusion, aneurysm, or vascular malformation. Venous sinuses: As permitted by contrast timing, patent. Anatomic variants: Right fetal PCA. Delayed phase: No abnormal intracranial enhancement. Review of the  MIP images confirms the above findings IMPRESSION: 1. Patent carotid and vertebral arteries. No dissection, aneurysm, or hemodynamically significant stenosis utilizing NASCET criteria. 2. Patent anterior and posterior intracranial circulation. No large vessel occlusion, aneurysm, or significant stenosis. 3. Mild calcific atherosclerosis of the aorta and carotid systems. Electronically Signed    By: Mitzi HansenLance  Furusawa-Stratton M.D.   On: 06/30/2018 03:46   Ct Angio Neck W Or Wo Contrast  Result Date: 06/30/2018 CLINICAL DATA:  78 y/o  M; Stroke, follow up.  Left leg weakness. EXAM: CT ANGIOGRAPHY HEAD AND NECK TECHNIQUE: Multidetector CT imaging of the head and neck was performed using the standard protocol during bolus administration of intravenous contrast. Multiplanar CT image reconstructions and MIPs were obtained to evaluate the vascular anatomy. Carotid stenosis measurements (when applicable) are obtained utilizing NASCET criteria, using the distal internal carotid diameter as the denominator. CONTRAST:  75mL OMNIPAQUE IOHEXOL 350 MG/ML SOLN COMPARISON:  06/29/2018 CT head.  04/07/2012 carotid ultrasound. FINDINGS: CTA NECK FINDINGS Aortic arch: Standard branching. Imaged portion shows no evidence of aneurysm or dissection. No significant stenosis of the major arch vessel origins. Mild aortic calcific atherosclerosis. Right carotid system: No evidence of dissection, stenosis (50% or greater) or occlusion. Non stenotic mild calcified plaque of carotid bifurcation. Left carotid system: No evidence of dissection, stenosis (50% or greater) or occlusion. Non stenotic mild calcified plaque carotid bifurcation. Vertebral arteries: Left dominant. No evidence of dissection, stenosis (50% or greater) or occlusion. Skeleton: No acute finding. C6-7 grade 1 anterolisthesis. C7-T2 discogenic degenerative changes with loss of disc space height and endplate sclerosis. Predominant right-sided facet arthropathy. No high-grade bony spinal canal stenosis. Other neck: Negative. Upper chest: Negative. Review of the MIP images confirms the above findings CTA HEAD FINDINGS Anterior circulation: No significant stenosis, proximal occlusion, aneurysm, or vascular malformation. Non stenotic calcific atherosclerosis of carotid siphons. Posterior circulation: No significant stenosis, proximal occlusion, aneurysm, or vascular  malformation. Venous sinuses: As permitted by contrast timing, patent. Anatomic variants: Right fetal PCA. Delayed phase: No abnormal intracranial enhancement. Review of the MIP images confirms the above findings IMPRESSION: 1. Patent carotid and vertebral arteries. No dissection, aneurysm, or hemodynamically significant stenosis utilizing NASCET criteria. 2. Patent anterior and posterior intracranial circulation. No large vessel occlusion, aneurysm, or significant stenosis. 3. Mild calcific atherosclerosis of the aorta and carotid systems. Electronically Signed   By: Mitzi HansenLance  Furusawa-Stratton M.D.   On: 06/30/2018 03:46   Ct Head Code Stroke Wo Contrast  Result Date: 06/29/2018 CLINICAL DATA:  Code stroke.  Left leg weakness. EXAM: CT HEAD WITHOUT CONTRAST TECHNIQUE: Contiguous axial images were obtained from the base of the skull through the vertex without intravenous contrast. COMPARISON:  Head CT 04/06/2012 and MRI 04/07/2012 FINDINGS: Brain: There is no evidence of acute infarct, intracranial hemorrhage, mass, midline shift, or extra-axial fluid collection. Mild cerebral atrophy is not greater than expected for age. Vascular: Calcified atherosclerosis at the skull base. No hyperdense vessel. Skull: No fracture or focal osseous lesion. Sinuses/Orbits: Visualized paranasal sinuses and mastoid air cells are clear. Bilateral cataract extraction is noted. Other: None. ASPECTS [facility]([place] Stroke Program Early CT Score) - Ganglionic level infarction (caudate, lentiform nuclei, internal capsule, insula, M1-M3 cortex): 7 - Supraganglionic infarction (M4-M6 cortex): 3 Total score (0-10 with 10 being normal): 10 IMPRESSION: 1. No evidence of acute intracranial abnormality. 2. ASPECTS is 10. These results were called by telephone at the time of interpretation on 06/29/2018 at 5:59 pm to Dr. Sharman CheekPHILLIP STAFFORD , who verbally acknowledged these results.  Electronically Signed   By: Sebastian Ache M.D.   On: 06/29/2018 18:00     PHYSICAL EXAM  Temp:  [97.6 F (36.4 C)-98.5 F (36.9 C)] 97.9 F (36.6 C) (05/04 1125) Pulse Rate:  [54-80] 58 (05/04 1000) Resp:  [12-26] 13 (05/04 1000) BP: (116-181)/(52-106) 159/83 (05/04 1000) SpO2:  [93 %-98 %] 95 % (05/04 1000) Weight:  [86.2 kg-87.1 kg] 87.1 kg (05/03 1809)  General - Well nourished, well developed, in no apparent distress.  Ophthalmologic - fundi not visualized due to noncooperation.  Cardiovascular - Regular rate and rhythm.  Mental Status -  Level of arousal and orientation to time, place, and person were intact. Language including expression, naming, repetition, comprehension was assessed and found intact. Fund of Knowledge was assessed and was intact.  Cranial Nerves II - XII - II - Visual field intact OU. III, IV, VI - Extraocular movements intact. V - Facial sensation intact bilaterally. VII - Facial movement intact bilaterally. VIII - Hearing & vestibular intact bilaterally. X - Palate elevates symmetrically. XI - Chin turning & shoulder shrug intact bilaterally. XII - Tongue protrusion intact.  Motor Strength - The patient's strength was normal in all extremities and pronator drift was absent except left lower extremity 4/5 proximal and distal.  Bulk was normal and fasciculations were absent.   Motor Tone - Muscle tone was assessed at the neck and appendages and was normal.  Reflexes - The patient's reflexes were symmetrical in all extremities and he had no pathological reflexes.  Sensory - Light touch, temperature/pinprick were assessed and were symmetrical.    Coordination - The patient had normal movements in the hands and right foot with no ataxia or dysmetria.  However, left heel-to-shin mildly ataxic.  Tremor was absent.  Gait and Station - deferred.   ASSESSMENT/PLAN Mr. Jeffery Strong is a 78 y.o. male with history of HTN, HLD, Parkinson's presenting to [facility] with L leg weakness where he received IV tPA 06/29/2018 at 1823.    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 ASPECTS 10.     CTA head & neck no ELVO. No stenosis. Mild aortic and carotid atherosclerosis.  MRI  pending   2D Echo EF 55 to 60%  UDS negative  LDL 64  HgbA1c 5.7  SCDs for VTE prophylaxis  On diet   aspirin 325 mg daily prior to admission, now on No antithrombotic as within 24h of tPA administration. Plan resume aspirin if imaging neg. DAPT x 3 weeks then Plavix alone.  Therapy recommendations:  pending   Disposition:  pending   Hypertension  Home meds:  Metoprolol XL 25, benicar 20  Stable BP goal per post tPA protocol x 24h following tPA administration . Long-term BP goal normotensive  Hyperlipidemia  Home meds:  lipitor 20, resumed in hospital  LDL 64, goal < 70  Continue statin at discharge  Other Stroke Risk Factors  Advanced age  Former Cigarette smoker   Family hx stroke (mother, father)  Coronary artery disease status post stenting in 1999  Other Active Problems  PD on selegiline -following with Dr. Sherryll Burger at [facility] day # 1  This patient is critically ill due to suspected stroke status post TPA, hypertension, Parkinson disease and at significant risk of neurological worsening, death form recurrent stroke, hemorrhagic conversion, seizure, hypertensive encephalopathy. This patient's care requires constant monitoring of vital signs, hemodynamics, respiratory and cardiac monitoring, review of  multiple databases, neurological assessment, discussion with family, other specialists and medical decision making of high complexity. I spent 35 minutes of neurocritical care time in the care of this patient. I had long discussion with patient at bedside, updated pt current condition, treatment plan and potential prognosis. He expressed understanding and appreciation.   Marvel Plan, MD PhD Stroke Neurology 06/30/2018 12:55 PM   To  contact Stroke Continuity provider, please refer to [email or web address]. After hours, contact General Neurology

## 2018-07-01 ENCOUNTER — Other Ambulatory Visit: Payer: Self-pay | Admitting: Neurology

## 2018-07-01 ENCOUNTER — Inpatient Hospital Stay (HOSPITAL_COMMUNITY): Payer: Medicare Other

## 2018-07-01 DIAGNOSIS — I6381 Other cerebral infarction due to occlusion or stenosis of small artery: Secondary | ICD-10-CM

## 2018-07-01 DIAGNOSIS — I2583 Coronary atherosclerosis due to lipid rich plaque: Secondary | ICD-10-CM

## 2018-07-01 DIAGNOSIS — I2510 Atherosclerotic heart disease of native coronary artery without angina pectoris: Secondary | ICD-10-CM

## 2018-07-01 DIAGNOSIS — I639 Cerebral infarction, unspecified: Secondary | ICD-10-CM

## 2018-07-01 DIAGNOSIS — I63321 Cerebral infarction due to thrombosis of right anterior cerebral artery: Secondary | ICD-10-CM

## 2018-07-01 DIAGNOSIS — R29898 Other symptoms and signs involving the musculoskeletal system: Secondary | ICD-10-CM

## 2018-07-01 LAB — BASIC METABOLIC PANEL
Anion gap: 11 (ref 5–15)
BUN: 17 mg/dL (ref 8–23)
CO2: 23 mmol/L (ref 22–32)
Calcium: 8.5 mg/dL — ABNORMAL LOW (ref 8.9–10.3)
Chloride: 103 mmol/L (ref 98–111)
Creatinine, Ser: 1.3 mg/dL — ABNORMAL HIGH (ref 0.61–1.24)
GFR calc Af Amer: >60 mL/min (ref >60)
GFR calc non Af Amer: 53 mL/min — ABNORMAL LOW (ref >60)
Glucose, Bld: 92 mg/dL (ref 70–99)
Potassium: 3.9 mmol/L (ref 3.5–5.1)
Sodium: 137 mmol/L (ref 135–145)

## 2018-07-01 LAB — CBC
HCT: 43.1 % (ref 39.0–52.0)
Hemoglobin: 14.3 g/dL (ref 13.0–17.0)
MCH: 29.9 pg (ref 26.0–34.0)
MCHC: 33.2 g/dL (ref 30.0–36.0)
MCV: 90 fL (ref 80.0–100.0)
Platelets: 155 10*3/uL (ref 150–400)
RBC: 4.79 MIL/uL (ref 4.22–5.81)
RDW: 13.5 % (ref 11.5–15.5)
WBC: 7.3 10*3/uL (ref 4.0–10.5)
nRBC: 0 % (ref 0.0–0.2)

## 2018-07-01 MED ORDER — CLOPIDOGREL BISULFATE 75 MG PO TABS: 75.0000 mg | ORAL_TABLET | Freq: Every day | ORAL | 2 refills | Status: DC

## 2018-07-01 MED ORDER — ASPIRIN 81 MG PO TBEC: 81.0000 mg | DELAYED_RELEASE_TABLET | Freq: Every day | ORAL | 0 refills | Status: AC

## 2018-07-01 NOTE — Progress Notes (Signed)
Bilateral lower extremity venous duplex has been completed. Preliminary results can be found in CV Proc through chart review.   07/01/18 9:18 AM Olen Cordial RVT

## 2018-07-01 NOTE — Plan of Care (Signed)
  Problem: Health Behavior/Discharge Planning: Goal: Ability to manage health-related needs will improve Outcome: Progressing   Problem: Clinical Measurements: Goal: Ability to maintain clinical measurements within normal limits will improve Outcome: Progressing Goal: Will remain free from infection Outcome: Progressing Goal: Diagnostic test results will improve Outcome: Progressing   Problem: Activity: Goal: Risk for activity intolerance will decrease Outcome: Progressing   Problem: Elimination: Goal: Will not experience complications related to bowel motility Outcome: Progressing Goal: Will not experience complications related to urinary retention Outcome: Progressing   Problem: Safety: Goal: Ability to remain free from injury will improve Outcome: Progressing   Problem: Skin Integrity: Goal: Risk for impaired skin integrity will decrease Outcome: Progressing   Problem: Education: Goal: Knowledge of disease or condition will improve Outcome: Progressing Goal: Knowledge of secondary prevention will improve Outcome: Progressing Goal: Knowledge of patient specific risk factors addressed and post discharge goals established will improve Outcome: Progressing   Problem: Health Behavior/Discharge Planning: Goal: Ability to manage health-related needs will improve Outcome: Progressing   Problem: Ischemic Stroke/TIA Tissue Perfusion: Goal: Complications of ischemic stroke/TIA will be minimized Outcome: Progressing   Problem: Education: Goal: Knowledge of General Education information will improve Description Including pain rating scale, medication(s)/side effects and non-pharmacologic comfort measures Outcome: Progressing   Problem: Health Behavior/Discharge Planning: Goal: Ability to manage health-related needs will improve Outcome: Progressing   Problem: Clinical Measurements: Goal: Ability to maintain clinical measurements within normal limits will  improve Outcome: Progressing Goal: Will remain free from infection Outcome: Progressing Goal: Diagnostic test results will improve Outcome: Progressing Goal: Respiratory complications will improve Outcome: Progressing Goal: Cardiovascular complication will be avoided Outcome: Progressing   Problem: Activity: Goal: Risk for activity intolerance will decrease Outcome: Progressing   Problem: Nutrition: Goal: Adequate nutrition will be maintained Outcome: Progressing   Problem: Coping: Goal: Level of anxiety will decrease Outcome: Progressing   Problem: Elimination: Goal: Will not experience complications related to bowel motility Outcome: Progressing Goal: Will not experience complications related to urinary retention Outcome: Progressing   Problem: Pain Managment: Goal: General experience of comfort will improve Outcome: Progressing   Problem: Safety: Goal: Ability to remain free from injury will improve Outcome: Progressing   Problem: Skin Integrity: Goal: Risk for impaired skin integrity will decrease Outcome: Progressing

## 2018-07-01 NOTE — Evaluation (Signed)
Speech Language Pathology Evaluation Patient Details Name: Jeffery Strong MRN: [id number] DOB: [birth year] Today's Date: 07/01/2018 Time: 3893-7342 SLP Time Calculation (min) (ACUTE ONLY): 16 min  Problem List:  Patient Active Problem List   Diagnosis Date Noted  . 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 Left leg weakness 06/29/2018  . Parkinson disease (HCC) 06/29/2018  . CAD (coronary artery disease) 06/29/2018  . HTN (hypertension) 06/29/2018  . HLD (hyperlipidemia) 06/29/2018   Past Medical History:  Past Medical History:  Diagnosis Date  . CAD (coronary artery disease)   . HLD (hyperlipidemia)   . Hypertension   . Parkinson disease (HCC)    right side per pt report   Past Surgical History:  Past Surgical History:  Procedure Laterality Date  . Cardiac Stents    . COLONOSCOPY WITH PROPOFOL N/A 12/23/2015   Procedure: COLONOSCOPY WITH PROPOFOL;  Surgeon: Scot Jun, MD;  Location: [facility] ENDOSCOPY;  Service: Endoscopy;  Laterality: N/A;  . ESOPHAGOGASTRODUODENOSCOPY (EGD) WITH PROPOFOL N/A 12/23/2015   Procedure: ESOPHAGOGASTRODUODENOSCOPY (EGD) WITH PROPOFOL;  Surgeon: Scot Jun, MD;  Location: [facility] ENDOSCOPY;  Service: Endoscopy;  Laterality: N/A;   HPI:  Pt is a 78 y.o. male with a history of hypertension, Parkinson's disease, hyperlipidemia who presented with left leg weakness that started abruptly at 2 PM on 06/30/18. TPA was asministered. MRI of the brain revealed subcentimeter acute subcortical infarct in the posterior right frontal lobe.   Assessment / Plan / Recommendation Clinical Impression  Pt denied any baseline or new deficits in speech, or language, but did endorse having some age-related memory difficulty. His speech and language skills are currently within normal limits and no overt cognitive deficits were noted. Further skilled SLP services are not clinically indicated at this time. Pt, and nursing were educated  regarding results and recommendation; both all parties verbalized understanding as well as agreement with plan of care.    SLP Assessment  SLP Recommendation/Assessment: Patient does not need any further Speech Lanaguage Pathology Services    Follow Up Recommendations  None    Frequency and Duration           SLP Evaluation Cognition  Overall Cognitive Status: Within Functional Limits for tasks assessed Arousal/Alertness: Awake/alert Orientation Level: Oriented X4 Attention: Focused;Sustained Focused Attention: Appears intact Sustained Attention: Appears intact Memory: Appears intact(Immediate: 3/3; Dealyed: 2/3; with cue: 1/1) Awareness: Appears intact Problem Solving: Appears intact(4/4)       Comprehension  Auditory Comprehension Overall Auditory Comprehension: Appears within functional limits for tasks assessed Yes/No Questions: Within Functional Limits Basic Biographical Questions: (5/5) Complex Questions: (5/5) Paragraph Comprehension (via yes/no questions): (4/4) Commands: Within Functional Limits(2 & 3 step: 5/5) Conversation: Complex Reading Comprehension Reading Status: Within funtional limits    Expression Expression Primary Mode of Expression: Verbal Verbal Expression Overall Verbal Expression: Appears within functional limits for tasks assessed Initiation: No impairment Automatic Speech: Counting;Day of week;Month of year Level of Generative/Spontaneous Verbalization: Sentence;Conversation Repetition: No impairment(5/5) Naming: No impairment Responsive: (5/5) Confrontation: Within functional limits(10/10) Convergent: (Sentence completion: 5/5) Verbal Errors: (None) Pragmatics: No impairment   Oral / Motor  Oral Motor/Sensory Function Overall Oral Motor/Sensory Function: Within functional limits Motor Speech Overall Motor Speech: Appears within functional limits for tasks assessed Respiration: Within functional limits Phonation: Normal Resonance:  Within functional limits Articulation: Within functional limitis Intelligibility: Intelligible Motor Planning: Witnin functional limits Motor Speech Errors: Not applicable   Kirin Brandenburger I. Vear Clock, MS, CCC-SLP Acute Rehabilitation Services Office number  [phone] Pager [phone]                   Scheryl MartenShanika I Madalina Rosman 07/01/2018, 12:14 PM

## 2018-07-01 NOTE — Discharge Summary (Addendum)
Stroke Discharge Summary  Patient ID: Jeffery Strong   MRN: [id number]      DOB: 02/15/1941  Date of Admission: 06/29/2018 Date of Discharge: 07/01/2018  Attending Physician:  Marvel Plan, MD, Stroke MD Consultant(s):    None  Patient's PCP:  [name], MD  DISCHARGE DIAGNOSIS:  Principal Problem:   Lacunar infarct, 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 Active Problems:   Left leg weakness   Parkinson disease (HCC)   CAD (coronary artery disease)   HTN (hypertension)   HLD (hyperlipidemia)   Past Medical History:  Diagnosis Date  . CAD (coronary artery disease)   . HLD (hyperlipidemia)   . Hypertension   . Parkinson disease (HCC)    right side per pt report   Past Surgical History:  Procedure Laterality Date  . Cardiac Stents    . COLONOSCOPY WITH PROPOFOL N/A 12/23/2015   Procedure: COLONOSCOPY WITH PROPOFOL;  Surgeon: Scot Jun, MD;  Location: [facility] ENDOSCOPY;  Service: Endoscopy;  Laterality: N/A;  . ESOPHAGOGASTRODUODENOSCOPY (EGD) WITH PROPOFOL N/A 12/23/2015   Procedure: ESOPHAGOGASTRODUODENOSCOPY (EGD) WITH PROPOFOL;  Surgeon: Scot Jun, MD;  Location: [facility] ENDOSCOPY;  Service: Endoscopy;  Laterality: N/A;    Allergies as of 07/01/2018      Reactions   Imipramine Hcl    unknown      Medication List    STOP taking these medications   aspirin 325 MG tablet Replaced by:  aspirin 81 MG EC tablet     TAKE these medications   aspirin 81 MG EC tablet Take 1 tablet (81 mg total) by mouth daily for 21 days. Take with plavix x 21 days then STOP aspirin. CONTINUE plavix Start taking on:  Jul 02, 2018 Replaces:  aspirin 325 MG tablet   atorvastatin 20 MG tablet Commonly known as:  LIPITOR Take 20 mg by mouth daily at 6 PM.   clopidogrel 75 MG tablet Commonly known as:  PLAVIX Take 1 tablet (75 mg total) by mouth daily. Start taking on:  Jul 02, 2018   gabapentin 300 MG capsule Commonly known as:  NEURONTIN Take 300 mg by mouth 2  (two) times daily.   metoprolol succinate 25 MG 24 hr tablet Commonly known as:  TOPROL-XL Take 25 mg by mouth daily.   olmesartan 20 MG tablet Commonly known as:  BENICAR Take 10 mg by mouth at bedtime.   selegiline 5 MG tablet Commonly known as:  ELDEPRYL Take 5 mg by mouth 2 (two) times daily.   traZODone 50 MG tablet Commonly known as:  DESYREL Take 50 mg by mouth at bedtime.       LABORATORY STUDIES CBC    Component Value Date/Time   WBC 7.3 07/01/2018 0326   RBC 4.79 07/01/2018 0326   HGB 14.3 07/01/2018 0326   HGB 14.7 04/09/2012 0436   HCT 43.1 07/01/2018 0326   HCT 44.3 04/09/2012 0436   PLT 155 07/01/2018 0326   PLT 150 04/09/2012 0436   MCV 90.0 07/01/2018 0326   MCV 90 04/09/2012 0436   MCH 29.9 07/01/2018 0326   MCHC 33.2 07/01/2018 0326   RDW 13.5 07/01/2018 0326   RDW 14.2 04/09/2012 0436   LYMPHSABS 1.5 06/29/2018 1735   LYMPHSABS 1.2 04/09/2012 0436   MONOABS 0.9 06/29/2018 1735   MONOABS 0.7 04/09/2012 0436   EOSABS 0.1 06/29/2018 1735   EOSABS 0.2 04/09/2012 0436   BASOSABS 0.0 06/29/2018 1735   BASOSABS 0.0 04/09/2012  0436   CMP    Component Value Date/Time   NA 137 07/01/2018 0326   NA 140 04/09/2012 0436   K 3.9 07/01/2018 0326   K 3.8 04/09/2012 0436   CL 103 07/01/2018 0326   CL 110 (H) 04/09/2012 0436   CO2 23 07/01/2018 0326   CO2 24 04/09/2012 0436   GLUCOSE 92 07/01/2018 0326   GLUCOSE 84 04/09/2012 0436   BUN 17 07/01/2018 0326   BUN 13 04/09/2012 0436   CREATININE 1.30 (H) 07/01/2018 0326   CREATININE 0.99 04/09/2012 0436   CALCIUM 8.5 (L) 07/01/2018 0326   CALCIUM 8.1 (L) 04/09/2012 0436   PROT 7.5 06/29/2018 1735   PROT 6.3 (L) 04/07/2012 0400   ALBUMIN 4.3 06/29/2018 1735   ALBUMIN 3.2 (L) 04/07/2012 0400   AST 26 06/29/2018 1735   AST 24 04/07/2012 0400   ALT 22 06/29/2018 1735   ALT 26 04/07/2012 0400   ALKPHOS 78 06/29/2018 1735   ALKPHOS 85 04/07/2012 0400   BILITOT 0.9 06/29/2018 1735   BILITOT 0.8  04/07/2012 0400   GFRNONAA 53 (L) 07/01/2018 0326   GFRNONAA >60 04/09/2012 0436   GFRAA >60 07/01/2018 0326   GFRAA >60 04/09/2012 0436   COAGS Lab Results  Component Value Date   INR 1.0 06/29/2018   INR 1.0 05/08/2011   Lipid Panel    Component Value Date/Time   CHOL 109 06/30/2018 0400   TRIG 42 06/30/2018 0400   HDL 37 (L) 06/30/2018 0400   CHOLHDL 2.9 06/30/2018 0400   VLDL 8 06/30/2018 0400   LDLCALC 64 06/30/2018 0400   HgbA1C  Lab Results  Component Value Date   HGBA1C 5.7 (H) 06/30/2018   Urinalysis    Component Value Date/Time   COLORURINE YELLOW (A) 06/29/2018 2044   APPEARANCEUR CLEAR (A) 06/29/2018 2044   APPEARANCEUR Clear 04/06/2012 1346   LABSPEC 1.015 06/29/2018 2044   LABSPEC 1.010 04/06/2012 1346   PHURINE 6.0 06/29/2018 2044   GLUCOSEU NEGATIVE 06/29/2018 2044   GLUCOSEU Negative 04/06/2012 1346   HGBUR LARGE (A) 06/29/2018 2044   BILIRUBINUR NEGATIVE 06/29/2018 2044   BILIRUBINUR Negative 04/06/2012 1346   KETONESUR NEGATIVE 06/29/2018 2044   PROTEINUR 30 (A) 06/29/2018 2044   NITRITE NEGATIVE 06/29/2018 2044   LEUKOCYTESUR NEGATIVE 06/29/2018 2044   LEUKOCYTESUR Negative 04/06/2012 1346   Urine Drug Screen     Component Value Date/Time   LABOPIA NONE DETECTED 06/29/2018 2044   COCAINSCRNUR NONE DETECTED 06/29/2018 2044   LABBENZ NONE DETECTED 06/29/2018 2044   AMPHETMU NONE DETECTED 06/29/2018 2044   THCU NONE DETECTED 06/29/2018 2044   LABBARB NONE DETECTED 06/29/2018 2044    Alcohol Level    Component Value Date/Time   ETH <10 06/29/2018 1735     SIGNIFICANT DIAGNOSTIC STUDIES Ct Angio Head W Or Strong Contrast  Result Date: 06/30/2018 CLINICAL DATA:  78 y/o  M; Stroke, follow up.  Left leg weakness. EXAM: CT ANGIOGRAPHY HEAD AND NECK TECHNIQUE: Multidetector CT imaging of the head and neck was performed using the standard protocol during bolus administration of intravenous contrast. Multiplanar CT image reconstructions and MIPs  were obtained to evaluate the vascular anatomy. Carotid stenosis measurements (when applicable) are obtained utilizing NASCET criteria, using the distal internal carotid diameter as the denominator. CONTRAST:  75mL OMNIPAQUE IOHEXOL 350 MG/ML SOLN COMPARISON:  06/29/2018 CT head.  04/07/2012 carotid ultrasound. FINDINGS: CTA NECK FINDINGS Aortic arch: Standard branching. Imaged portion shows no evidence of aneurysm or dissection. No significant  stenosis of the major arch vessel origins. Mild aortic calcific atherosclerosis. Right carotid system: No evidence of dissection, stenosis (50% or greater) or occlusion. Non stenotic mild calcified plaque of carotid bifurcation. Left carotid system: No evidence of dissection, stenosis (50% or greater) or occlusion. Non stenotic mild calcified plaque carotid bifurcation. Vertebral arteries: Left dominant. No evidence of dissection, stenosis (50% or greater) or occlusion. Skeleton: No acute finding. C6-7 grade 1 anterolisthesis. C7-T2 discogenic degenerative changes with loss of disc space height and endplate sclerosis. Predominant right-sided facet arthropathy. No high-grade bony spinal canal stenosis. Other neck: Negative. Upper chest: Negative. Review of the MIP images confirms the above findings CTA HEAD FINDINGS Anterior circulation: No significant stenosis, proximal occlusion, aneurysm, or vascular malformation. Non stenotic calcific atherosclerosis of carotid siphons. Posterior circulation: No significant stenosis, proximal occlusion, aneurysm, or vascular malformation. Venous sinuses: As permitted by contrast timing, patent. Anatomic variants: Right fetal PCA. Delayed phase: No abnormal intracranial enhancement. Review of the MIP images confirms the above findings IMPRESSION: 1. Patent carotid and vertebral arteries. No dissection, aneurysm, or hemodynamically significant stenosis utilizing NASCET criteria. 2. Patent anterior and posterior intracranial circulation. No  large vessel occlusion, aneurysm, or significant stenosis. 3. Mild calcific atherosclerosis of the aorta and carotid systems. Electronically Signed   By: Mitzi Hansen M.D.   On: 06/30/2018 03:46   Ct Angio Neck W Or Strong Contrast  Result Date: 06/30/2018 CLINICAL DATA:  78 y/o  M; Stroke, follow up.  Left leg weakness. EXAM: CT ANGIOGRAPHY HEAD AND NECK TECHNIQUE: Multidetector CT imaging of the head and neck was performed using the standard protocol during bolus administration of intravenous contrast. Multiplanar CT image reconstructions and MIPs were obtained to evaluate the vascular anatomy. Carotid stenosis measurements (when applicable) are obtained utilizing NASCET criteria, using the distal internal carotid diameter as the denominator. CONTRAST:  75mL OMNIPAQUE IOHEXOL 350 MG/ML SOLN COMPARISON:  06/29/2018 CT head.  04/07/2012 carotid ultrasound. FINDINGS: CTA NECK FINDINGS Aortic arch: Standard branching. Imaged portion shows no evidence of aneurysm or dissection. No significant stenosis of the major arch vessel origins. Mild aortic calcific atherosclerosis. Right carotid system: No evidence of dissection, stenosis (50% or greater) or occlusion. Non stenotic mild calcified plaque of carotid bifurcation. Left carotid system: No evidence of dissection, stenosis (50% or greater) or occlusion. Non stenotic mild calcified plaque carotid bifurcation. Vertebral arteries: Left dominant. No evidence of dissection, stenosis (50% or greater) or occlusion. Skeleton: No acute finding. C6-7 grade 1 anterolisthesis. C7-T2 discogenic degenerative changes with loss of disc space height and endplate sclerosis. Predominant right-sided facet arthropathy. No high-grade bony spinal canal stenosis. Other neck: Negative. Upper chest: Negative. Review of the MIP images confirms the above findings CTA HEAD FINDINGS Anterior circulation: No significant stenosis, proximal occlusion, aneurysm, or vascular malformation. Non  stenotic calcific atherosclerosis of carotid siphons. Posterior circulation: No significant stenosis, proximal occlusion, aneurysm, or vascular malformation. Venous sinuses: As permitted by contrast timing, patent. Anatomic variants: Right fetal PCA. Delayed phase: No abnormal intracranial enhancement. Review of the MIP images confirms the above findings IMPRESSION: 1. Patent carotid and vertebral arteries. No dissection, aneurysm, or hemodynamically significant stenosis utilizing NASCET criteria. 2. Patent anterior and posterior intracranial circulation. No large vessel occlusion, aneurysm, or significant stenosis. 3. Mild calcific atherosclerosis of the aorta and carotid systems. Electronically Signed   By: Mitzi Hansen M.D.   On: 06/30/2018 03:46   Jeffery Strong Contrast  Result Date: 06/30/2018 CLINICAL DATA:  Left leg weakness.  Received tPA yesterday.  EXAM: MRI HEAD WITHOUT CONTRAST TECHNIQUE: Multiplanar, multiecho pulse sequences of the brain and surrounding structures were obtained without intravenous contrast. COMPARISON:  Head CT 06/29/2018 and MRI 04/07/2012 FINDINGS: Brain: There is a subcentimeter acute subcortical infarct in the posteromedial right frontal lobe. No intracranial hemorrhage, mass, midline shift, or extra-axial fluid collection is identified. Scattered T2 hyperintensities in the cerebral white matter bilaterally have progressed from 2014 and are nonspecific but compatible with minimal chronic small vessel ischemic disease. Mild cerebral atrophy is not greater than expected for age. Vascular: Major intracranial vascular flow voids are preserved. Skull and upper cervical spine: Unremarkable bone marrow signal. Sinuses/Orbits: Bilateral cataract extraction. Paranasal sinuses and mastoid air cells are clear. Other: None. IMPRESSION: 1. Subcentimeter acute subcortical infarct in the posterior right frontal lobe. 2. Minimal chronic small vessel ischemic disease. Electronically  Signed   By: Sebastian Ache M.D.   On: 06/30/2018 18:09   Ct Head Code Stroke Strong Contrast  Result Date: 06/29/2018 CLINICAL DATA:  Code stroke.  Left leg weakness. EXAM: CT HEAD WITHOUT CONTRAST TECHNIQUE: Contiguous axial images were obtained from the base of the skull through the vertex without intravenous contrast. COMPARISON:  Head CT 04/06/2012 and MRI 04/07/2012 FINDINGS: Brain: There is no evidence of acute infarct, intracranial hemorrhage, mass, midline shift, or extra-axial fluid collection. Mild cerebral atrophy is not greater than expected for age. Vascular: Calcified atherosclerosis at the skull base. No hyperdense vessel. Skull: No fracture or focal osseous lesion. Sinuses/Orbits: Visualized paranasal sinuses and mastoid air cells are clear. Bilateral cataract extraction is noted. Other: None. ASPECTS [facility] Stroke Program Early CT Score) - Ganglionic level infarction (caudate, lentiform nuclei, internal capsule, insula, M1-M3 cortex): 7 - Supraganglionic infarction (M4-M6 cortex): 3 Total score (0-10 with 10 being normal): 10 IMPRESSION: 1. No evidence of acute intracranial abnormality. 2. ASPECTS is 10. These results were called by telephone at the time of interpretation on 06/29/2018 at 5:59 pm to Dr. Sharman Cheek , who verbally acknowledged these results. Electronically Signed   By: Sebastian Ache M.D.   On: 06/29/2018 18:00   Vas [place] Lower Extremity Venous (dvt)  Result Date: 07/01/2018  Lower Venous Study Indications: Embolic stroke.  Performing Technologist: Chanda Busing RVT  Examination Guidelines: A complete evaluation includes B-mode imaging, spectral Doppler, color Doppler, and power Doppler as needed of all accessible portions of each vessel. Bilateral testing is considered an integral part of a complete examination. Limited examinations for reoccurring indications may be performed as noted.  +---------+---------------+---------+-----------+----------+-------+ RIGHT     CompressibilityPhasicitySpontaneityPropertiesSummary +---------+---------------+---------+-----------+----------+-------+ CFV      Full           Yes      Yes                          +---------+---------------+---------+-----------+----------+-------+ SFJ      Full                                                 +---------+---------------+---------+-----------+----------+-------+ FV Prox  Full                                                 +---------+---------------+---------+-----------+----------+-------+ FV Mid   Full                                                 +---------+---------------+---------+-----------+----------+-------+  FV DistalFull                                                 +---------+---------------+---------+-----------+----------+-------+ PFV      Full                                                 +---------+---------------+---------+-----------+----------+-------+ POP      Full           Yes      Yes                          +---------+---------------+---------+-----------+----------+-------+ PTV      Full                                                 +---------+---------------+---------+-----------+----------+-------+ PERO     Full                                                 +---------+---------------+---------+-----------+----------+-------+   +---------+---------------+---------+-----------+----------+-------+ LEFT     CompressibilityPhasicitySpontaneityPropertiesSummary +---------+---------------+---------+-----------+----------+-------+ CFV      Full           Yes      Yes                          +---------+---------------+---------+-----------+----------+-------+ SFJ      Full                                                 +---------+---------------+---------+-----------+----------+-------+ FV Prox  Full                                                  +---------+---------------+---------+-----------+----------+-------+ FV Mid   Full                                                 +---------+---------------+---------+-----------+----------+-------+ FV DistalFull                                                 +---------+---------------+---------+-----------+----------+-------+ PFV      Full                                                 +---------+---------------+---------+-----------+----------+-------+ POP  Full           Yes      Yes                          +---------+---------------+---------+-----------+----------+-------+ PTV      Full                                                 +---------+---------------+---------+-----------+----------+-------+ PERO     Full                                                 +---------+---------------+---------+-----------+----------+-------+     Summary: Right: There is no evidence of deep vein thrombosis in the lower extremity. No cystic structure found in the popliteal fossa. Left: There is no evidence of deep vein thrombosis in the lower extremity. No cystic structure found in the popliteal fossa.  *See table(s) above for measurements and observations.    Preliminary     2D Echocardiogram   1. The left ventricle has normal systolic function, with an ejection fraction of 55-60%. The cavity size was normal. Left ventricular diastolic Doppler parameters are consistent with impaired relaxation. No evidence of left ventricular regional wall  motion abnormalities.  2. The right ventricle has normal systolic function. The cavity was normal. There is no increase in right ventricular wall thickness. Right ventricular systolic pressure is normal with an estimated pressure of 27.2 mmHg.  3. Left atrial size was mildly dilated.  4. The aortic valve is tricuspid. Mild thickening of the aortic valve. Sclerosis without any evidence of stenosis of the aortic valve.      HISTORY OF PRESENT ILLNESS Jeffery Strong is a 78 y.o. male with a history of hypertension, hyperlipidemia who presents with left leg weakness that started abruptly at 2 PM on 06/30/2018 (LKW).  He was walking and suddenly his left leg was not lifting up as well.  He tried to "walk it off" but it kept getting worse and worse.  This prompted him to go to the emergency department at [facility] regional where tele-neurology administered IV TPA. He was not an IR candidate d/t mild symptoms. mRS 1. He was transferred to [facility] for further care. He does have a history of Parkinson's disease and is on selegiline for this.   HOSPITAL COURSE Jeffery. RODRIQUEZ Strong is a 78 y.o. male with history of HTN, HLD, Parkinson's presenting to [facility] with L leg weakness where he received IV tPA 06/29/2018 at 1823.   Stroke: very small right ACA infarct s/p tPA - likely due to small vessel disease, cannot rule out embolic source.  Code Stroke CT head No acute stroke. ASPECTS 10.     CTA head & neck no ELVO. No stenosis. Mild aortic and carotid atherosclerosis.  MRI  subcemtimeter subcortical posterior R frontal lobe infarct  LE doppler neg DVT  2D Echo EF 55 to 60%  UDS negative  LDL 64  HgbA1c 5.7  aspirin 325 mg daily prior to admission, now aspirin 81 and plavix 75 mb daily. Plan DAPT x 3 weeks then Plavix alone.  Therapy recommendations:  no therapy needs  OP 30d monitor to look for AF as  source of infarct  Disposition:  return home  Hypertension  Home meds:  Metoprolol XL 25, benicar 20  Stable  Resume home meds  BP goal normotensive  Hyperlipidemia  Home meds:  lipitor 20, resumed in hospital  LDL 64, goal < 70  Continue statin at discharge  Other Stroke Risk Factors  Advanced age  Former Cigarette smoker   Family hx stroke (mother, father)  Coronary artery disease status post stenting in 1999  Other Active Problems  PD on selegiline -following with Dr. Sherryll Burger in  [facility] EXAM Blood pressure 122/67, pulse 65, temperature 97.8 F (36.6 C), temperature source Oral, resp. rate 20, height  (1.778 m), SpO2 96 %.  General - Well nourished, well developed, in no apparent distress.  Ophthalmologic - fundi not visualized due to noncooperation.  Cardiovascular - Regular rate and rhythm.  Mental Status -  Level of arousal and orientation to time, place, and person were intact. Language including expression, naming, repetition, comprehension was assessed and found intact. Fund of Knowledge was assessed and was intact.  Cranial Nerves II - XII - II - Visual field intact OU. III, IV, VI - Extraocular movements intact. V - Facial sensation intact bilaterally. VII - Facial movement intact bilaterally. VIII - Hearing & vestibular intact bilaterally. X - Palate elevates symmetrically. XI - Chin turning & shoulder shrug intact bilaterally. XII - Tongue protrusion intact.  Motor Strength - The patient's strength was normal in all extremities and pronator drift was absent except left lower extremity 4/5 proximal and distal.  Bulk was normal and fasciculations were absent.   Motor Tone - Muscle tone was assessed at the neck and appendages and was normal.  Reflexes - The patient's reflexes were symmetrical in all extremities and he had no pathological reflexes.  Sensory - Light touch, temperature/pinprick were assessed and were symmetrical.    Coordination - The patient had normal movements in the hands and right foot with no ataxia or dysmetria.  However, left heel-to-shin mildly ataxic.  Tremor was absent.  Gait and Station - deferred.   Discharge Diet   Regular thin liquids  DISCHARGE PLAN  Disposition:  Return home  aspirin 81 mg daily and clopidogrel 75 mg daily for secondary stroke prevention for 3 weeks then PLAVIX alone.  Ongoing stroke risk factor control by Primary Care Physician at time of  discharge  Follow-up [name], MD in 2 weeks.  Follow-up in Dr. Sherryll Burger, neurologist in [place] in 4 weeks, pt to schedule an appointment.   OP 30d monitor to look for AF as source of infarct - arranged with [facility].  Continue cardiology follow up with Dr. Darrold Junker in [place]  40 minutes were spent preparing discharge.  Marvel Plan, MD PhD Stroke Neurology 07/01/2018 7:25 PM

## 2018-07-01 NOTE — Progress Notes (Signed)
Patient signed for release of medical records for his MRI results. AVS reviewed with patient and verbalized understanding of change to medication regiment and that [facility] would be calling to setup monitoring with a OP 30d monitor & he will FU with [facility]. He also knows to FU with IM & Neurology outpatient within two weeks. Will take him in a wheelchair to main entrance.

## 2018-07-01 NOTE — Progress Notes (Signed)
Occupational Therapy Evaluation  Pt presents to OT with the below diagnosis.  He demonstrates mild imbalance and processing speed is a bit slowed (may be close to his baseline).    He is able to perform ADLs with supervision, and cognition appears to be [facility].  No further OT needs identified.  Wife will provide intermittent assist as needed.  PTA, pt was very active gardening and working full time.  OT will sign off at this time    06/30/18 2000  OT Visit Information  Last OT Received On 07/01/18  Assistance Needed +1  PT/OT/SLP Co-Evaluation/Treatment Yes  Reason for Co-Treatment For patient/therapist safety  OT goals addressed during session ADL's and self-care  History of Present Illness 78 year old male with acute ischemic infarct that is now improving after IV TPA  Precautions  Precautions Fall  Precaution Comments h/o parkinson's disease   Restrictions  Weight Bearing Restrictions No  Home Living  Family/patient expects to be discharged to: Private residence  Living Arrangements Spouse/significant other  Available Help at Discharge Family;Available 24 hours/day  Type of Home House  Home Access Stairs to enter  Entrance Stairs-Number of Steps 5  Entrance Stairs-Rails Can reach both;Left;Right  Home Layout One level  Bathroom Shower/Tub Walk-in shower  Bathroom Toilet Handicapped height  Home Equipment Shower seat - built in  Prior Function  Level of Independence Independent  Comments Pt reports h/o falls 1-2 years ago.  He works at a [employer] 3-4 days/week.  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  he was able to perform serial 2's forward and backward while ambulating and while executing other requests.  Therepists frequently gave him multi step commands to perform while walking and counting and he performed those well.    Upper Extremity Assessment  Upper Extremity Assessment Overall WFL for tasks assessed  Lower Extremity Assessment  Lower Extremity Assessment Defer to PT evaluation  ADL  Overall ADL's  Needs assistance/impaired  Eating/Feeding Independent  Grooming Wash/dry hands;Wash/dry face;Oral care;Brushing hair;Supervision/safety;Standing  Upper Body Bathing Set up;Sitting  Lower Body Bathing Supervison/ safety;Sit to/from stand  Upper Body Dressing  Set up;Sitting  Lower Body Dressing Supervision/safety;Sit to/from Lawyerstand  Toilet Transfer Supervision/safety;Ambulation;Comfort height toilet  Toileting- Clothing Manipulation and Hygiene Supervision/safety;Sit to/from stand  Functional mobility during ADLs Supervision/safety  General ADL Comments Pt requires supervision due to mild balance deficit  Vision- History  Baseline Vision/History Wears glasses  Wears Glasses At all times  Patient Visual Report No change from baseline  Vision- Assessment  Vision Assessment? Yes  Eye Alignment WFL  Ocular Range of Motion [facility]  Alignment/Gaze Preference WDL  Tracking/Visual Pursuits Able to track stimulus in all quads without difficulty  [facility]acc[facility]  Visual Fields No apparent deficits  Perception  Perception Tested? Yes  Praxis  Praxis tested? WFL  Bed Mobility  Overal bed mobility Needs Assistance  Bed Mobility Supine to Sit  Supine to sit Supervision  Transfers  Overall transfer level Needs assistance  Equipment used None  Transfers Sit to/from Stand;Stand Pivot Transfers  Sit to Stand Supervision  Stand pivot transfers Min  guard  General transfer comment for pivot, min guard initially during  transitional movements from bed to chair, 3/4 elevation to upright. Supervision for descent to chair  Balance  Overall balance assessment Needs assistance  Sitting balance-Leahy Scale Good  Standing balance-Leahy Scale Good  Standing balance comment good static balance and righting reactions in standing  High Level Balance Comments patient with noted instbaility during higher level balance tasks but patient was able to self correct without physical assist  General Comments  General comments (skin integrity, edema, etc.) reviewed BEFAST and risk factors of stroke with pt   OT - End of Session  Equipment Utilized During Treatment Gait belt  Activity Tolerance Patient tolerated treatment well  Patient left in chair;with call bell/phone within reach;with chair alarm set  Nurse Communication Mobility status  OT Assessment  OT Recommendation/Assessment Patient does not need any further OT services  OT Visit Diagnosis Unsteadiness on feet (R26.81)  OT Problem List Impaired balance (sitting and/or standing)  AM-PAC OT "6 Clicks" Daily Activity Outcome Measure (Version 2)  Help from another person eating meals? 4  Help from another person taking care of personal grooming? 4  Help from another person toileting, which includes using toliet, bedpan, or urinal? 4  Help from another person bathing (including washing, rinsing, drying)? 4  Help from another person to put on and taking off regular upper body clothing? 4  Help from another person to put on and taking off regular lower body clothing? 4  6 Click Score 24  OT Recommendation  Follow Up Recommendations No OT follow up;Supervision - Intermittent  OT Equipment None recommended by OT  Acute Rehab OT Goals  Patient Stated Goal to go home  OT Goal Formulation All assessment and education complete, DC therapy  OT Time Calculation  OT Start Time (ACUTE ONLY) 1141  OT Stop Time (ACUTE ONLY) 1215  OT Time Calculation (min) 34 min  OT Evaluation   $OT Eval Moderate Complexity 1 Mod  Written Expression  Dominant Hand Right  Jeani Hawking, OTR/L Acute Rehabilitation Services Pager [phone] Office [phone]

## 2018-07-03 ENCOUNTER — Other Ambulatory Visit: Payer: Self-pay | Admitting: Cardiology

## 2018-07-03 DIAGNOSIS — I639 Cerebral infarction, unspecified: Secondary | ICD-10-CM

## 2018-07-07 ENCOUNTER — Telehealth: Payer: Self-pay | Admitting: *Deleted

## 2018-07-07 NOTE — Telephone Encounter (Signed)
[employer] to ship 30 day event monitor to his home.  Instructions reviewed briefly as they are included in the monitor kit.  Dr. Rayann Heman to read.  Results to Dr. Rosalin Hawking and Dr. Josefa Half [facility]).

## 2018-07-08 DIAGNOSIS — Z8673 Personal history of transient ischemic attack (TIA), and cerebral infarction without residual deficits: Secondary | ICD-10-CM | POA: Insufficient documentation

## 2018-07-24 ENCOUNTER — Other Ambulatory Visit: Payer: Self-pay

## 2018-07-24 NOTE — Patient Outreach (Signed)
[facility]) Care Management  07/24/2018  IMIR BUTZ Apr 24, 1940 [id number]  EMMI: stroke Referral date: 07/24/18 Referral reason: problems refilling medication Insurance: [employer] Day # 13  Telephone call to patient regarding EMMI  Stroke red alert. HIPAA verified with patient. RNCM introduced herself and explained reason for call. Patient states he is not having any problems refilling his medications. He reports he has seen his primary MD since discharge from the hospital and has a follow up with the neurologist. On 6/ 2/20.  Patient states he has transportation to his appointments. He reports being back to work.  Patient denies any new symptoms or concerns at this time.  RNCM discussed signs/ symptoms of stroke with patient. Advised patient that 911 should be contacted for stroke like symptoms. RNCM reviewed COVID 19 symptoms and precautions with patient. Patient advised to notify doctor for minor symptoms.  RNCM advised patient to notify MD of any changes in condition prior to scheduled appointment. RNCM verified patient aware of 911 services for urgent/ emergent needs., Patient verbalized understanding and appreciation of call.    PLAN: RNCM will close due to patient being assessed and having no further needs.   George Ina RN,BSN,CCM [facility] Telephonic  [phone]

## 2018-09-30 ENCOUNTER — Other Ambulatory Visit: Payer: Self-pay

## 2018-09-30 NOTE — Patient Outreach (Signed)
First attempt to obtain mRs. No answer. Left message for return call.  

## 2018-10-07 ENCOUNTER — Other Ambulatory Visit: Payer: Self-pay

## 2018-10-07 NOTE — Patient Outreach (Addendum)
Second attempt to obtain mRs. No answer. Left message for return call.  

## 2018-10-09 ENCOUNTER — Other Ambulatory Visit: Payer: Self-pay

## 2018-10-09 NOTE — Patient Outreach (Signed)
Telephone outreach to patient to obtain mRs was successfully completed. mRs= 1. 

## 2019-05-21 NOTE — Telephone Encounter (Signed)
07/12/18.Marland Kitchenreceived cancellation notification from [employer].. No Baseline.. monitor returned 07/17/18.

## 2019-06-28 ENCOUNTER — Emergency Department (HOSPITAL_COMMUNITY)
Admission: EM | Admit: 2019-06-28 | Discharge: 2019-06-28 | Disposition: A | Discharge status: Home / Self Care | Payer: Medicare Other | Attending: Emergency Medicine | Admitting: Emergency Medicine

## 2019-06-28 ENCOUNTER — Emergency Department (HOSPITAL_COMMUNITY): Payer: Medicare Other

## 2019-06-28 ENCOUNTER — Other Ambulatory Visit: Payer: Self-pay

## 2019-06-28 ENCOUNTER — Encounter (HOSPITAL_COMMUNITY): Payer: Self-pay | Admitting: Emergency Medicine

## 2019-06-28 DIAGNOSIS — Z7901 Long term (current) use of anticoagulants: Secondary | ICD-10-CM | POA: Insufficient documentation

## 2019-06-28 DIAGNOSIS — I10 Essential (primary) hypertension: Secondary | ICD-10-CM | POA: Diagnosis not present

## 2019-06-28 DIAGNOSIS — R531 Weakness: Secondary | ICD-10-CM | POA: Diagnosis not present

## 2019-06-28 DIAGNOSIS — Z87891 Personal history of nicotine dependence: Secondary | ICD-10-CM | POA: Insufficient documentation

## 2019-06-28 DIAGNOSIS — I2510 Atherosclerotic heart disease of native coronary artery without angina pectoris: Secondary | ICD-10-CM | POA: Diagnosis not present

## 2019-06-28 DIAGNOSIS — Z79899 Other long term (current) drug therapy: Secondary | ICD-10-CM | POA: Insufficient documentation

## 2019-06-28 LAB — URINALYSIS, ROUTINE W REFLEX MICROSCOPIC
Bacteria, UA: NONE SEEN
Bilirubin Urine: NEGATIVE
Glucose, UA: NEGATIVE mg/dL
Hgb urine dipstick: NEGATIVE
Ketones, ur: NEGATIVE mg/dL
Leukocytes,Ua: NEGATIVE
Nitrite: NEGATIVE
Protein, ur: 30 mg/dL — AB
Specific Gravity, Urine: 1.015 (ref 1.005–1.030)
pH: 5 (ref 5.0–8.0)

## 2019-06-28 LAB — CBC
HCT: 49 % (ref 39.0–52.0)
Hemoglobin: 15.8 g/dL (ref 13.0–17.0)
MCH: 29.6 pg (ref 26.0–34.0)
MCHC: 32.2 g/dL (ref 30.0–36.0)
MCV: 91.9 fL (ref 80.0–100.0)
Platelets: 168 10*3/uL (ref 150–400)
RBC: 5.33 MIL/uL (ref 4.22–5.81)
RDW: 13.6 % (ref 11.5–15.5)
WBC: 6.3 10*3/uL (ref 4.0–10.5)
nRBC: 0 % (ref 0.0–0.2)

## 2019-06-28 LAB — COMPREHENSIVE METABOLIC PANEL
ALT: 22 U/L (ref 0–44)
AST: 24 U/L (ref 15–41)
Albumin: 3.6 g/dL (ref 3.5–5.0)
Alkaline Phosphatase: 69 U/L (ref 38–126)
Anion gap: 7 (ref 5–15)
BUN: 18 mg/dL (ref 8–23)
CO2: 26 mmol/L (ref 22–32)
Calcium: 9.1 mg/dL (ref 8.9–10.3)
Chloride: 106 mmol/L (ref 98–111)
Creatinine, Ser: 1.45 mg/dL — ABNORMAL HIGH (ref 0.61–1.24)
GFR calc Af Amer: 53 mL/min — ABNORMAL LOW (ref >60)
GFR calc non Af Amer: 46 mL/min — ABNORMAL LOW (ref >60)
Glucose, Bld: 109 mg/dL — ABNORMAL HIGH (ref 70–99)
Potassium: 4.4 mmol/L (ref 3.5–5.1)
Sodium: 139 mmol/L (ref 135–145)
Total Bilirubin: 0.8 mg/dL (ref 0.3–1.2)
Total Protein: 6.7 g/dL (ref 6.5–8.1)

## 2019-06-28 LAB — I-STAT CHEM 8, ED
BUN: 19 mg/dL (ref 8–23)
Calcium, Ion: 1.17 mmol/L (ref 1.15–1.40)
Chloride: 103 mmol/L (ref 98–111)
Creatinine, Ser: 1.3 mg/dL — ABNORMAL HIGH (ref 0.61–1.24)
Glucose, Bld: 103 mg/dL — ABNORMAL HIGH (ref 70–99)
HCT: 47 % (ref 39.0–52.0)
Hemoglobin: 16 g/dL (ref 13.0–17.0)
Potassium: 4.2 mmol/L (ref 3.5–5.1)
Sodium: 140 mmol/L (ref 135–145)
TCO2: 26 mmol/L (ref 22–32)

## 2019-06-28 LAB — DIFFERENTIAL
Abs Immature Granulocytes: 0.02 10*3/uL (ref 0.00–0.07)
Basophils Absolute: 0 10*3/uL (ref 0.0–0.1)
Basophils Relative: 0 %
Eosinophils Absolute: 0.1 10*3/uL (ref 0.0–0.5)
Eosinophils Relative: 2 %
Immature Granulocytes: 0 %
Lymphocytes Relative: 22 %
Lymphs Abs: 1.4 10*3/uL (ref 0.7–4.0)
Monocytes Absolute: 0.7 10*3/uL (ref 0.1–1.0)
Monocytes Relative: 11 %
Neutro Abs: 4.1 10*3/uL (ref 1.7–7.7)
Neutrophils Relative %: 65 %

## 2019-06-28 LAB — PROTIME-INR
INR: 1.1 (ref 0.8–1.2)
Prothrombin Time: 13.7 seconds (ref 11.4–15.2)

## 2019-06-28 LAB — APTT: aPTT: 28 seconds (ref 24–36)

## 2019-06-28 LAB — CBG MONITORING, ED: Glucose-Capillary: 102 mg/dL — ABNORMAL HIGH (ref 70–99)

## 2019-06-28 MED ORDER — SODIUM CHLORIDE 0.9% FLUSH
3.0000 mL | Freq: Once | INTRAVENOUS | Status: DC
Start: 2019-06-28 — End: 2019-06-29

## 2019-06-28 NOTE — ED Provider Notes (Signed)
Jeffery Strong   CSN: [id number] Arrival date & time: 06/28/19  1627     History Chief Complaint  Patient presents with  . Hypertension  . Weakness    Jeffery Strong is a 79 y.o. male.  The history is provided by the patient.  Illness Location:  General Quality:  Weakness, headache Severity:  Mild Onset quality:  Gradual Duration:  3 weeks Progression:  Waxing and waning Chronicity:  New Relieved by:  Tylenol Worsened by:  Nothing Associated symptoms: fatigue and headaches   Associated symptoms: no abdominal pain, no chest pain, no congestion, no cough, no diarrhea, no ear pain, no fever, no loss of consciousness, no myalgias, no nausea, no rash, no rhinorrhea, no shortness of breath, no sore throat, no vomiting and no wheezing   Risk factors:  Stroke      Past Medical History:  Diagnosis Date  . CAD (coronary artery disease)   . HLD (hyperlipidemia)   . Hypertension   . Parkinson disease (HCC)    right side per pt report  . Stroke [facility])     Patient Active Problem List   Diagnosis Date Noted  . 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 Left leg weakness 06/29/2018  . Parkinson disease (HCC) 06/29/2018  . CAD (coronary artery disease) 06/29/2018  . HTN (hypertension) 06/29/2018  . HLD (hyperlipidemia) 06/29/2018    Past Surgical History:  Procedure Laterality Date  . Cardiac Stents    . COLONOSCOPY WITH PROPOFOL N/A 12/23/2015   Procedure: COLONOSCOPY WITH PROPOFOL;  Surgeon: Scot Jun, MD;  Location: [facility] ENDOSCOPY;  Service: Endoscopy;  Laterality: N/A;  . ESOPHAGOGASTRODUODENOSCOPY (EGD) WITH PROPOFOL N/A 12/23/2015   Procedure: ESOPHAGOGASTRODUODENOSCOPY (EGD) WITH PROPOFOL;  Surgeon: Scot Jun, MD;  Location: [facility] ENDOSCOPY;  Service: Endoscopy;  Laterality: N/A;       Family History  Problem Relation Age of Onset  . Cancer Mother   . Stroke Mother     . Stroke Father   . Parkinsonism Father   . Cancer Brother   . Alzheimer's disease Sister     Social History   Tobacco Use  . Smoking status: Former Games developer  . Smokeless tobacco: Never Used  Substance Use Topics  . Alcohol use: No  . Drug use: Not on file    Home Medications Prior to Admission medications   Medication Sig Start Date End Date Taking? Authorizing Provider  atorvastatin (LIPITOR) 20 MG tablet Take 20 mg by mouth daily at 6 PM.    [provider]  clopidogrel (PLAVIX) 75 MG tablet Take 1 tablet (75 mg total) by mouth daily. 07/02/18   Layne Benton, NP  gabapentin (NEURONTIN) 300 MG capsule Take 300 mg by mouth 2 (two) times daily.    [provider]  metoprolol succinate (TOPROL-XL) 25 MG 24 hr tablet Take 25 mg by mouth daily.    [provider]  olmesartan (BENICAR) 20 MG tablet Take 10 mg by mouth at bedtime. 12/11/17 12/11/18  [provider]  selegiline (ELDEPRYL) 5 MG tablet Take 5 mg by mouth 2 (two) times daily.     [provider]  traZODone (DESYREL) 50 MG tablet Take 50 mg by mouth at bedtime.    [provider]    Allergies    Imipramine hcl  Review of Systems   Review of Systems  Constitutional: Positive for fatigue. Negative for chills and fever.  HENT: Negative for congestion, ear pain, rhinorrhea and sore throat.   Eyes: Negative for pain and visual disturbance.  Respiratory: Negative for cough, shortness of breath and wheezing.   Cardiovascular: Negative for chest pain and palpitations.  Gastrointestinal: Negative for abdominal pain, diarrhea, nausea and vomiting.  Genitourinary: Negative for dysuria and hematuria.  Musculoskeletal: Negative for arthralgias, back pain and myalgias.  Skin: Negative for color change and rash.  Neurological: Positive for headaches. Negative for seizures, loss of consciousness and syncope.  All other systems reviewed and are negative.   Physical  Exam Updated Vital Signs BP (!) 171/83   Pulse (!) 59   Temp 98.2 F (36.8 C) (Oral)   Resp 18   Ht 5\' 10"  (1.778 m)   Wt 86.2 kg   SpO2 98%   BMI 27.26 kg/m   Physical Exam Vitals and nursing Strong reviewed.  Constitutional:      General: He is not in acute distress.    Appearance: He is well-developed. He is not ill-appearing.  HENT:     Head: Normocephalic and atraumatic.     Mouth/Throat:     Mouth: Mucous membranes are moist.  Eyes:     Extraocular Movements: Extraocular movements intact.     Conjunctiva/sclera: Conjunctivae normal.     Pupils: Pupils are equal, round, and reactive to light.  Cardiovascular:     Rate and Rhythm: Normal rate and regular rhythm.     Heart sounds: No murmur.  Pulmonary:     Effort: Pulmonary effort is normal. No respiratory distress.     Breath sounds: Normal breath sounds.  Abdominal:     Palpations: Abdomen is soft.     Tenderness: There is no abdominal tenderness.  Musculoskeletal:     Cervical back: Neck supple.  Skin:    General: Skin is warm and dry.  Neurological:     General: No focal deficit present.     Mental Status: He is alert and oriented to person, place, and time.     Cranial Nerves: No cranial nerve deficit.     Sensory: No sensory deficit.     Motor: No weakness.     Coordination: Coordination normal.     Gait: Gait normal.     Comments: 5+ out of 5 strength throughout, normal sensation, no drift, normal finger-to-nose finger, no visual field deficit     ED Results / Procedures / Treatments   Labs (all labs ordered are listed, but only abnormal results are displayed) Labs Reviewed  COMPREHENSIVE METABOLIC PANEL - Abnormal; Notable for the following components:      Result Value   Glucose, Bld 109 (*)    Creatinine, Ser 1.45 (*)    GFR calc non Af Amer 46 (*)    GFR calc Af Amer 53 (*)    All other components within normal limits  URINALYSIS, ROUTINE W REFLEX MICROSCOPIC - Abnormal; Notable for the  following components:   Protein, ur 30 (*)    All other components within normal limits  I-STAT CHEM 8, ED - Abnormal; Notable for the following components:   Creatinine, Ser 1.30 (*)    Glucose, Bld 103 (*)    All other components within normal limits  CBG MONITORING, ED - Abnormal; Notable for the following components:   Glucose-Capillary 102 (*)    All other components within normal limits  PROTIME-INR  APTT  CBC  DIFFERENTIAL    EKG EKG Interpretation  Date/Time:  Sunday Jun 28 2019 16:56:29  EDT Ventricular Rate:  67 PR Interval:  176 QRS Duration: 82 QT Interval:  360 QTC Calculation: 380 R Axis:   -8 Text Interpretation: Normal sinus rhythm Septal infarct , age undetermined Inferior infarct , age undetermined Abnormal ECG Confirmed by Virgina Norfolk [phone]) on 06/28/2019 7:43:48 PM   Radiology CT HEAD WO CONTRAST  Result Date: 06/28/2019 CLINICAL DATA:  Acute headache, normal neuro exam. Possible stroke. Patient reports headache and weakness for 3 weeks. Hypertension. EXAM: CT HEAD WITHOUT CONTRAST TECHNIQUE: Contiguous axial images were obtained from the base of the skull through the vertex without intravenous contrast. COMPARISON:  Head CT and brain MRI 06/29/2018 FINDINGS: Brain: No intracranial hemorrhage, mass effect, or midline shift. Stable brain volume, normal for age. No hydrocephalus. The basilar cisterns are patent. There is mild periventricular chronic small vessel ischemia. No evidence of territorial infarct or acute ischemia. No extra-axial or intracranial fluid collection. Vascular: Atherosclerosis of skullbase vasculature without hyperdense vessel or abnormal calcification. Skull: No fracture or focal lesion. Sinuses/Orbits: Paranasal sinuses and mastoid air cells are clear. The visualized orbits are unremarkable. Bilateral cataract resection. Other: None. IMPRESSION: 1. No acute intracranial abnormality. No hemorrhage or evidence of acute ischemia. 2. Mild chronic  small vessel ischemia. Electronically Signed   By: Narda Rutherford M.D.   On: 06/28/2019 17:16    Procedures Procedures (including critical care time)  Medications Ordered in ED Medications  sodium chloride flush (NS) 0.9 % injection 3 mL (3 mLs Intravenous Not Given 06/28/19 1819)    ED Course  I have reviewed the triage vital signs and the nursing notes.  Pertinent labs & imaging results that were available during my care of the patient were reviewed by me and considered in my medical decision making (see chart for details).    MDM Rules/Calculators/A&P                      STELMO CATTANACH is a 79 year old male with history of hypertension, Parkinson's, stroke who presents to the ED with generalized weakness, high blood pressure.  Patient with mild elevation of blood pressure but otherwise normal vitals.  No fever.  Patient with headaches, weakness over the last several weeks.  Concern for stroke.  However neurological exam is normal.  Head CT showed no head bleed, no new findings.  History and physical is not consistent with stroke.  No chest pain.  EKG shows sinus rhythm.  Doubt cardiac process.  No cough, no sputum production.  Patient currently with no headache.  Lab work showed no significant anemia, electrolyte abnormality, kidney injury.  No leukocytosis.  Overall patient appears well.  Was given reassurance. No concern for infectious process.  Recommend follow-up with primary care doctor and discharged from ED in good condition.  No concern for an acute emergency at this time.  Given return precautions.  This chart was dictated using voice recognition software.  Despite best efforts to proofread,  errors can occur which can change the documentation meaning.   Final Clinical Impression(s) / ED Diagnoses Final diagnoses:  Hypertension, unspecified type  Generalized weakness    Rx / DC Orders ED Discharge Orders    None       Virgina Norfolk, DO 06/28/19 2010

## 2019-06-28 NOTE — ED Triage Notes (Signed)
Pt c/o headache, HTN and generalized weakness for "a while". Hx stroke.A&O x 4.

## 2019-11-23 ENCOUNTER — Other Ambulatory Visit: Payer: Self-pay | Admitting: *Deleted

## 2019-11-23 DIAGNOSIS — R319 Hematuria, unspecified: Secondary | ICD-10-CM

## 2019-11-24 ENCOUNTER — Other Ambulatory Visit: Payer: Self-pay

## 2019-11-24 ENCOUNTER — Encounter: Payer: Self-pay | Admitting: Urology

## 2019-11-24 ENCOUNTER — Other Ambulatory Visit
Admission: RE | Admit: 2019-11-24 | Discharge: 2019-11-24 | Disposition: A | Discharge status: Home / Self Care | Payer: Medicare Other | Attending: Urology | Admitting: Urology

## 2019-11-24 ENCOUNTER — Ambulatory Visit (INDEPENDENT_AMBULATORY_CARE_PROVIDER_SITE_OTHER): Payer: Medicare Other | Admitting: Urology

## 2019-11-24 VITALS — BP 173/82 | HR 62 | Ht 68.0 in | Wt 196.0 lb

## 2019-11-24 DIAGNOSIS — N401 Enlarged prostate with lower urinary tract symptoms: Secondary | ICD-10-CM

## 2019-11-24 DIAGNOSIS — N390 Urinary tract infection, site not specified: Secondary | ICD-10-CM | POA: Diagnosis not present

## 2019-11-24 DIAGNOSIS — I10 Essential (primary) hypertension: Secondary | ICD-10-CM | POA: Insufficient documentation

## 2019-11-24 DIAGNOSIS — R3121 Asymptomatic microscopic hematuria: Secondary | ICD-10-CM | POA: Diagnosis not present

## 2019-11-24 DIAGNOSIS — R350 Frequency of micturition: Secondary | ICD-10-CM

## 2019-11-24 DIAGNOSIS — N138 Other obstructive and reflux uropathy: Secondary | ICD-10-CM

## 2019-11-24 DIAGNOSIS — R319 Hematuria, unspecified: Secondary | ICD-10-CM | POA: Diagnosis present

## 2019-11-24 LAB — URINALYSIS, COMPLETE (UACMP) WITH MICROSCOPIC
Bacteria, UA: NONE SEEN
Glucose, UA: NEGATIVE mg/dL
Leukocytes,Ua: NEGATIVE
Nitrite: NEGATIVE
Protein, ur: 100 mg/dL — AB
Specific Gravity, Urine: 1.025 (ref 1.005–1.030)
Squamous Epithelial / HPF: NONE SEEN (ref 0–5)
WBC, UA: NONE SEEN WBC/hpf (ref 0–5)
pH: 5.5 (ref 5.0–8.0)

## 2019-11-24 LAB — BLADDER SCAN AMB NON-IMAGING

## 2019-11-24 NOTE — Patient Instructions (Signed)
Cystoscopy Cystoscopy is a procedure that is used to help diagnose and sometimes treat conditions that affect the lower urinary tract. The lower urinary tract includes the bladder and the urethra. The urethra is the tube that drains urine from the bladder. Cystoscopy is done using a thin, tube-shaped instrument with a light and camera at the end (cystoscope). The cystoscope may be hard or flexible, depending on the goal of the procedure. The cystoscope is inserted through the urethra, into the bladder. Cystoscopy may be recommended if you have:  Urinary tract infections that keep coming back.  Blood in the urine (hematuria).  An inability to control when you urinate (urinary incontinence) or an overactive bladder.  Unusual cells found in a urine sample.  A blockage in the urethra, such as a urinary stone.  Painful urination.  An abnormality in the bladder found during an intravenous pyelogram (IVP) or CT scan. Cystoscopy may also be done to remove a sample of tissue to be examined under a microscope (biopsy). What are the risks? Generally, this is a safe procedure. However, problems may occur, including:  Infection.  Bleeding.  What happens during the procedure?  1. You will be given one or more of the following: ? A medicine to numb the area (local anesthetic). 2. The area around the opening of your urethra will be cleaned. 3. The cystoscope will be passed through your urethra into your bladder. 4. Germ-free (sterile) fluid will flow through the cystoscope to fill your bladder. The fluid will stretch your bladder so that your health care provider can clearly examine your bladder walls. 5. Your doctor will look at the urethra and bladder. 6. The cystoscope will be removed The procedure may vary among health care providers  What can I expect after the procedure? After the procedure, it is common to have: 1. Some soreness or pain in your abdomen and urethra. 2. Urinary symptoms.  These include: ? Mild pain or burning when you urinate. Pain should stop within a few minutes after you urinate. This may last for up to 1 week. ? A small amount of blood in your urine for several days. ? Feeling like you need to urinate but producing only a small amount of urine. Follow these instructions at home: General instructions  Return to your normal activities as told by your health care provider.   Do not drive for 24 hours if you were given a sedative during your procedure.  Watch for any blood in your urine. If the amount of blood in your urine increases, call your health care provider.  If a tissue sample was removed for testing (biopsy) during your procedure, it is up to you to get your test results. Ask your health care provider, or the department that is doing the test, when your results will be ready.  Drink enough fluid to keep your urine pale yellow.  Keep all follow-up visits as told by your health care provider. This is important. Contact a health care provider if you:  Have pain that gets worse or does not get better with medicine, especially pain when you urinate.  Have trouble urinating.  Have more blood in your urine. Get help right away if you:  Have blood clots in your urine.  Have abdominal pain.  Have a fever or chills.  Are unable to urinate. Summary  Cystoscopy is a procedure that is used to help diagnose and sometimes treat conditions that affect the lower urinary tract.  Cystoscopy is done using   a thin, tube-shaped instrument with a light and camera at the end.  After the procedure, it is common to have some soreness or pain in your abdomen and urethra.  Watch for any blood in your urine. If the amount of blood in your urine increases, call your health care provider.  If you were prescribed an antibiotic medicine, take it as told by your health care provider. Do not stop taking the antibiotic even if you start to feel better. This  information is not intended to replace advice given to you by your health care provider. Make sure you discuss any questions you have with your health care provider. Document Revised: 02/04/2018 Document Reviewed: 02/04/2018 Elsevier Patient Education  2020 [employer]   

## 2019-11-24 NOTE — Progress Notes (Signed)
11/24/19 11:44 AM   Jeffery Strong 02/22/1941 [id number]  CC: Microscopic hematuria, urinary symptoms, UTI  HPI: I saw Jeffery Strong in urology clinic today for the above issues.  He is a 79 year old male with history of CAD, Parkinson's, and stroke who recently was diagnosed with a UTI when he was at the beach a few weeks ago.  He had symptoms at that time of chills, dysuria, and malaise, and ultimately improved with antibiotics.  His PCP repeated a urinalysis after treatment with antibiotics, which showed persistent microscopic hematuria, and in the setting of his UTI he was referred to urology.  He reports 1 to 2 months of worsening urinary symptoms with urgency and weakening stream and straining.  He denies any history of previous urinary tract infections.  He endorses 20 pounds of weight loss over the last year.  He denies any pain at this time.  He has a 20-pack-year smoking history and quit many years ago.  He denies any carcinogenic exposures.  Plavix is on his medication list, but he denies that he is currently taking this medication.  IPSS score today 28, with quality of life mixed.  Urinalysis today with 11-20 RBCs, no bacteria, 0 WBCs, no leukocytes, nitrite negative.  PVR is normal at 19 mL.   PMH: Past Medical History:  Diagnosis Date  . CAD (coronary artery disease)   . HLD (hyperlipidemia)   . Hypertension   . Parkinson disease (HCC)    right side per pt report  . Stroke [facility])     Surgical History: Past Surgical History:  Procedure Laterality Date  . Cardiac Stents    . COLONOSCOPY WITH PROPOFOL N/A 12/23/2015   Procedure: COLONOSCOPY WITH PROPOFOL;  Surgeon: Scot Jun, MD;  Location: [facility] ENDOSCOPY;  Service: Endoscopy;  Laterality: N/A;  . ESOPHAGOGASTRODUODENOSCOPY (EGD) WITH PROPOFOL N/A 12/23/2015   Procedure: ESOPHAGOGASTRODUODENOSCOPY (EGD) WITH PROPOFOL;  Surgeon: Scot Jun, MD;  Location: [facility] ENDOSCOPY;  Service: Endoscopy;  Laterality: N/A;     Family History: Family History  Problem Relation Age of Onset  . Cancer Mother   . Stroke Mother   . Stroke Father   . Parkinsonism Father   . Cancer Brother   . Alzheimer's disease Sister     Social History:  reports that he has quit smoking. He has never used smokeless tobacco. He reports that he does not drink alcohol. No history on file for drug use.  Physical Exam: BP (!) 173/82 (BP Location: Left Arm, Patient Position: Sitting, Cuff Size: Normal)   Pulse 62   Ht 5\' 8"  (1.727 m)   Wt 196 lb (88.9 kg)   BMI 29.80 kg/m    Constitutional:  Alert and oriented, No acute distress. Cardiovascular: No clubbing, cyanosis, or edema. Respiratory: Normal respiratory effort, no increased work of breathing. GI: Abdomen is soft, nontender, nondistended, no abdominal masses GU: Uncircumcised phallus with patent meatus, testicles 20 cc and descended bilaterally  Laboratory Data: Reviewed, see HPI  Pertinent Imaging: No prior abdominal cross-sectional imaging to review  Assessment & Plan:   In summary, is a 79 year old male with a number of comorbidities who presents with recent UTI, persistent microscopic hematuria, and bothersome urinary symptoms of weak stream, straining, and urgency.  Urinalysis today shows no evidence of infection, but persistent microscopic hematuria.  We discussed common possible etiologies of hematuria including BPH, malignancy, urolithiasis, medical renal disease, and idiopathic. Standard workup recommended by the [facility] includes imaging with CT urogram to assess the upper  tracts, and cystoscopy.   We discussed that the next steps in care would be pending his CT and cystoscopy results.  If no evidence of malignancy is found, would need to consider outlet procedures for his bothersome urinary symptoms and history of UTI.  CT urogram and cystoscopy for evaluation of hematuria and UTI  I spent 60 total minutes on the day of the encounter including pre-visit  review of the medical record, face-to-face time with the patient, and post visit ordering of labs/imaging/tests.  Legrand Rams, MD 11/24/2019  [facility] [address] [place], [place] [address] [phone]

## 2019-12-16 ENCOUNTER — Ambulatory Visit (INDEPENDENT_AMBULATORY_CARE_PROVIDER_SITE_OTHER): Payer: Medicare Other | Admitting: Urology

## 2019-12-16 ENCOUNTER — Encounter: Payer: Self-pay | Admitting: Urology

## 2019-12-16 ENCOUNTER — Other Ambulatory Visit: Payer: Self-pay

## 2019-12-16 VITALS — BP 179/84 | HR 61 | Ht 68.0 in | Wt 194.2 lb

## 2019-12-16 DIAGNOSIS — R3121 Asymptomatic microscopic hematuria: Secondary | ICD-10-CM | POA: Diagnosis not present

## 2019-12-16 DIAGNOSIS — N401 Enlarged prostate with lower urinary tract symptoms: Secondary | ICD-10-CM

## 2019-12-16 DIAGNOSIS — N138 Other obstructive and reflux uropathy: Secondary | ICD-10-CM

## 2019-12-16 DIAGNOSIS — N210 Calculus in bladder: Secondary | ICD-10-CM | POA: Diagnosis not present

## 2019-12-16 LAB — BLADDER SCAN AMB NON-IMAGING

## 2019-12-16 MED ORDER — LIDOCAINE HCL URETHRAL/MUCOSAL 2 % EX GEL
1.0000 "application " | Freq: Once | CUTANEOUS | Status: AC
  Administered 2019-12-16: 1 via URETHRAL

## 2019-12-16 NOTE — Patient Instructions (Signed)

## 2019-12-16 NOTE — Progress Notes (Signed)
Cystoscopy Procedure Note:  Indication: Microscopic hematuria, UTI, urinary symptoms  After informed consent and discussion of the procedure and its risks, EVART MCDONNELL was positioned and prepped in the standard fashion. Cystoscopy was performed with a flexible cystoscope. The urethra, bladder neck and entire bladder was visualized in a standard fashion. The prostate was large with obstructing lateral lobes and high bladder neck. The ureteral orifices were visualized in their normal location and orientation.  Moderate bladder trabeculations but no suspicious bladder lesions.  On retroflexion there is a 1 cm black stone at the bladder base, with some intravesical protrusion of the prostate.  Imaging: CT urogram ordered, not completed yet  Findings: Large prostate with obstructing lateral lobes, 1 cm bladder stone ------------------------------------------  Assessment and Plan: 79 year old male with microscopic hematuria, recent UTI, and worsening BPH urinary symptoms.  Cystoscopy today shows a very large prostate with a 1 cm bladder stone, but no other suspicious bladder lesions.  We discussed the need for CT to complete microscopic hematuria work-up, and that prostate volume can be evaluated on that CT.  We also reviewed the finding of a bladder stone that is likely related to long-term incomplete bladder emptying, and a contributor to his UTI and urinary symptoms.  I recommended a simultaneous outlet procedure at the same time as cystolitholapaxy to prevent repeat bladder stones/UTI in the future, and improve his urinary symptoms.  We discussed the risks and benefits of HoLEP at length.  The procedure requires general anesthesia and takes 2 to 3 hours, and a holmium laser is used to enucleate the prostate and push this tissue into the bladder.  A morcellator is then used to remove this tissue, which is sent for pathology.  The vast majority of patients are able to discharge the same day with a  catheter in place for 2 to 3 days, and will follow-up in clinic for a voiding trial.  Approximately 5% of patients will be admitted overnight to monitor the urine, or if they have multiple co-morbidities.  We specifically discussed the risks of bleeding, infection, retrograde ejaculation, temporary urgency and urge incontinence, very low risk of long-term incontinence, pathologic evaluation of prostate tissue and possible detection of prostate cancer or other malignancy, and possible need for additional procedures.  Follow-up in 2 to 3 weeks with CT hematuria to complete work-up, likely schedule cystolitholapaxy and HOLEP at that visit  Legrand Rams, MD 12/16/2019

## 2019-12-22 ENCOUNTER — Other Ambulatory Visit: Payer: Self-pay

## 2019-12-22 DIAGNOSIS — N210 Calculus in bladder: Secondary | ICD-10-CM

## 2019-12-22 DIAGNOSIS — R3121 Asymptomatic microscopic hematuria: Secondary | ICD-10-CM

## 2019-12-22 NOTE — Progress Notes (Signed)
c 

## 2019-12-23 ENCOUNTER — Other Ambulatory Visit: Payer: Medicare Other

## 2019-12-24 ENCOUNTER — Ambulatory Visit
Admission: RE | Admit: 2019-12-24 | Discharge: 2019-12-24 | Disposition: A | Discharge status: Home / Self Care | Payer: Medicare Other | Source: Ambulatory Visit | Attending: Urology | Admitting: Urology

## 2019-12-24 ENCOUNTER — Other Ambulatory Visit
Admission: RE | Admit: 2019-12-24 | Discharge: 2019-12-24 | Disposition: A | Discharge status: Home / Self Care | Payer: Medicare Other | Source: Home / Self Care | Attending: Urology | Admitting: Urology

## 2019-12-24 ENCOUNTER — Other Ambulatory Visit: Payer: Self-pay

## 2019-12-24 DIAGNOSIS — N210 Calculus in bladder: Secondary | ICD-10-CM

## 2019-12-24 DIAGNOSIS — R3121 Asymptomatic microscopic hematuria: Secondary | ICD-10-CM | POA: Insufficient documentation

## 2019-12-24 LAB — CREATININE, SERUM
Creatinine, Ser: 1.24 mg/dL (ref 0.61–1.24)
GFR, Estimated: 59 mL/min — ABNORMAL LOW (ref >60)

## 2019-12-24 MED ORDER — IOHEXOL 300 MG/ML  SOLN
150.0000 mL | Freq: Once | INTRAMUSCULAR | Status: AC | PRN
  Administered 2019-12-24: 125 mL via INTRAVENOUS

## 2019-12-28 DIAGNOSIS — Z87442 Personal history of urinary calculi: Secondary | ICD-10-CM

## 2019-12-28 HISTORY — DX: Personal history of urinary calculi: Z87.442

## 2019-12-31 ENCOUNTER — Other Ambulatory Visit: Payer: Self-pay

## 2019-12-31 ENCOUNTER — Ambulatory Visit (INDEPENDENT_AMBULATORY_CARE_PROVIDER_SITE_OTHER): Payer: Medicare Other | Admitting: Urology

## 2019-12-31 ENCOUNTER — Encounter: Payer: Self-pay | Admitting: Urology

## 2019-12-31 VITALS — BP 162/76 | HR 60 | Ht 71.0 in | Wt 190.0 lb

## 2019-12-31 DIAGNOSIS — N138 Other obstructive and reflux uropathy: Secondary | ICD-10-CM

## 2019-12-31 DIAGNOSIS — N200 Calculus of kidney: Secondary | ICD-10-CM | POA: Diagnosis not present

## 2019-12-31 DIAGNOSIS — N390 Urinary tract infection, site not specified: Secondary | ICD-10-CM

## 2019-12-31 DIAGNOSIS — N210 Calculus in bladder: Secondary | ICD-10-CM | POA: Diagnosis not present

## 2019-12-31 DIAGNOSIS — N401 Enlarged prostate with lower urinary tract symptoms: Secondary | ICD-10-CM

## 2019-12-31 MED ORDER — SULFAMETHOXAZOLE-TRIMETHOPRIM 800-160 MG PO TABS
1.0000 | ORAL_TABLET | Freq: Two times a day (BID) | ORAL | 0 refills | Status: DC
Start: 2019-12-31 — End: 2020-01-14

## 2019-12-31 NOTE — H&P (View-Only) (Signed)
   12/31/2019 10:11 AM   Jeffery Strong [birth year] [id number]  Reason for visit: Follow up BPH/LUTS, UTI, bladder stone, microscopic hematuria  HPI: I saw Jeffery Strong and his wife in urology clinic for the above issues today.  I originally saw him in late September 2021 after he had had a UTI and had a long history of weak urinary stream with straining and urgency, as well as microscopic hematuria.  He underwent cystoscopy on 12/16/2019 that showed a large prostate with a 1 cm bladder stone, but no suspicious lesions.  He completed a CT urogram on 12/24/2019 that showed a 90 g prostate, 1 cm bladder stone, 1 cm right renal pelvis stone, and no other suspicious findings.  I personally reviewed the CT.  Urinalysis today is grossly concerning for another infection with greater than 30 WBCs, moderate bacteria, 3-10 RBCs, no yeast, nitrite positive, trace leuk esterase.  Sent for culture.  Started on Bactrim DS twice daily x10 days.  I recommended cystolitholapaxy, outlet procedure with either HOLEP or UroLift, as well as consideration of treatment of his 1 cm right renal pelvis stone simultaneously.  We discussed the differences between HOLEP and UroLift, and he would like to pursue HOLEP which is very reasonable with his severe urinary symptoms, history of recurrent UTIs, and very large prostate of 90 g.  We discussed the risks and benefits of HoLEP at length.  The procedure requires general anesthesia and takes 2 to 3 hours, and a holmium laser is used to enucleate the prostate and push this tissue into the bladder.  A morcellator is then used to remove this tissue, which is sent for pathology.  The vast majority of patients are able to discharge the same day with a catheter in place for 2 to 3 days, and will follow-up in clinic for a voiding trial.  Approximately 5% of patients will be admitted overnight to monitor the urine, or if they have multiple co-morbidities.  We specifically discussed the risks of  bleeding, infection, retrograde ejaculation, temporary urgency and urge incontinence, very low risk of long-term incontinence, pathologic evaluation of prostate tissue and possible detection of prostate cancer or other malignancy, and possible need for additional procedures.  We specifically discussed the risks ureteroscopy including bleeding, infection/sepsis, stent related symptoms including flank pain/urgency/frequency/incontinence/dysuria, ureteral injury, inability to access stone, or need for staged or additional procedures.  Schedule cystolitholapaxy, HOLEP(90g), right ureteroscopy/laser lithotripsy/stent placement Bactrim DS twice daily for acute UTI, follow-up cultures  I spent 45 total minutes on the day of the encounter including pre-visit review of the medical record, face-to-face time with the patient, and post visit ordering of labs/imaging/tests.   Jeffery Come, MD  [facility] [address] [place] [address] [place], [place] [address] [phone]

## 2019-12-31 NOTE — Patient Instructions (Addendum)
Holmium Laser Enucleation of the Prostate (HoLEP)  HoLEP is a treatment for men with benign prostatic hyperplasia (BPH). The laser surgery removed blockages of urine flow, and is done without any incisions on the body.     What is HoLEP?  HoLEP is a type of laser surgery used to treat obstruction (blockage) of urine flow as a result of benign prostatic hyperplasia (BPH). In men with BPH, the prostate gland is not cancerous, but has become enlarged. An enlarged prostate can result in a number of urinary tract symptoms such as weak urinary stream, difficulty in starting urination, inability to urinate, frequent urination, or getting up at night to urinate.  HoLEP was developed in the 1990's as a more effective and less expensive surgical option for BPH, compared to other surgical options such as laser vaporization(PVP/greenlight laser), transurethral resection of the prostate(TURP), and open simple prostatectomy.   What happens during a HoLEP?  HoLEP requires general anesthesia ("asleep" throughout the procedure).   An antibiotic is given to reduce the risk of infection  A surgical instrument called a resectoscope is inserted through the urethra (the tube that carries urine from the bladder). The resectoscope has a camera that allows the surgeon to view the internal structure of the prostate gland, and to see where the incisions are being made during surgery.  The laser is inserted into the resectoscope and is used to enucleate (free up) the enlarged prostate tissue from the capsule (outer shell) and then to seal up any blood vessels. The tissue that has been removed is pushed back into the bladder.  A morcellator is placed through the resectoscope, and is used to suction out the prostate tissue that has been pushed into the bladder.  When the prostate tissue has been removed, the resectoscope is removed, and a foley catheter is placed to allow healing and drain the urine from the  bladder.     What happens after a HoLEP?  More than 90% of patients go home the same day a few hours after surgery. Less than 10% will be admitted to the hospital overnight for observation to monitor the urine, or if they have other medical problems.  Fluid is flushed through the catheter for about 1 hour after surgery to clear any blood from the urine. It is normal to have some blood in the urine after surgery. The need for blood transfusion is extremely rare.  Eating and drinking are permitted after the procedure once the patient has fully awakened from anesthesia.  The catheter is usually removed 2-3 days after surgery- the patient will come to clinic to have the catheter removed and make sure they can urinate on their own.  It is very important to drink lots of fluids after surgery for one week to keep the bladder flushed.  At first, there may be some burning with urination, but this typically improved within a few hours to days. Most patients do not have a significant amount of pain, and narcotic pain medications are rarely needed.  Symptoms of urinary frequency, urgency, and even leakage are NORMAL for the first few weeks after surgery as the bladder adjusts after having to work hard against blockage from the prostate for many years. This will improve, but can sometimes take several months.  The use of pelvic floor exercises (Kegel exercises) can help improve problems with urinary incontinence.   After catheter removal, patients will be seen at 6 weeks and 6 months for symptom check  No heavy lifting for   at least 2-3 weeks after surgery, however patients can walk and do light activities the first day after surgery. Return to work time depends on occupation.    What are the advantages of HoLEP?  HoLEP has been studied in many different parts of the world and has been shown to be a safe and effective procedure. Although there are many types of BPH surgeries available, HoLEP offers a  unique advantage in being able to remove a large amount of tissue without any incisions on the body, even in very large prostates, while decreasing the risk of bleeding and providing tissue for pathology (to look for cancer). This decreases the need for blood transfusions during surgery, minimizes hospital stay, and reduces the risk of needing repeat treatment.  What are the side effects of HoLEP?  Temporary burning and bleeding during urination. Some blood may be seen in the urine for weeks after surgery and is part of the healing process.  Urinary incontinence (inability to control urine flow) is expected in all patients immediately after surgery and they should wear pads for the first few days/weeks. This typically improves over the course of several weeks. Performing Kegel exercises can help decrease leakage from stress maneuvers such as coughing, sneezing, or lifting. The rate of long term leakage is very low. Patients may also have leakage with urgency and this may be treated with medication. The risk of urge incontinence can be dependent on several factors including age, prostate size, symptoms, and other medical problems.  Retrograde ejaculation or "backwards ejaculation." In 75% of cases, the patient will not see any fluid during ejaculation after surgery.  Erectile function is generally not significantly affected.   What are the risks of HoLEP?  Injury to the urethra or development of scar tissue at a later date  Injury to the capsule of the prostate (typically treated with longer catheterization).  Injury to the bladder or ureteral orifices (where the urine from the kidney drains out)  Infection of the bladder, testes, or kidneys  Return of urinary obstruction at a later date requiring another operation (<2%)  Need for blood transfusion or re-operation due to bleeding  Failure to relieve all symptoms and/or need for prolonged catheterization after surgery  5-15% of patients are  found to have previously undiagnosed prostate cancer in their specimen. Prostate cancer can be treated after HoLEP.  Standard risks of anesthesia including blood clots, heart attacks, etc  When should I call my doctor?  Fever over 101.3 degrees  Inability to urinate, or large blood clots in the urine    Laser Therapy for Kidney Stones Laser therapy for kidney stones is a procedure to break up small, hard mineral deposits that form in the kidney (kidney stones). The procedure is done using a device that produces a focused beam of light (laser). The laser breaks up kidney stones into pieces that are small enough to be passed out of the body through urination or removed from the body during the procedure. You may need laser therapy if you have kidney stones that are painful or block your urinary tract. This procedure is done by inserting a tube (ureteroscope) into your kidney through the urethral opening. The urethra is the part of the body that drains urine from the bladder. In women, the urethra opens above the vaginal opening. In men, the urethra opens at the tip of the penis. The ureteroscope is inserted through the urethra, and surgical instruments are moved through the bladder and the muscular tube that  connects the kidney to the bladder (ureter) until they reach the kidney. Tell a health care provider about:  Any allergies you have.  All medicines you are taking, including vitamins, herbs, eye drops, creams, and over-the-counter medicines.  Any problems you or family members have had with anesthetic medicines.  Any blood disorders you have.  Any surgeries you have had.  Any medical conditions you have.  Whether you are pregnant or may be pregnant. What are the risks? Generally, this is a safe procedure. However, problems may occur, including:  Infection.  Bleeding.  Allergic reactions to medicines.  Damage to the urethra, bladder, or ureter.  Urinary tract infection  (UTI).  Narrowing of the urethra (urethral stricture).  Difficulty passing urine.  Blockage of the kidney caused by a fragment of kidney stone. What happens before the procedure? Medicines  Ask your health care provider about: ? Changing or stopping your regular medicines. This is especially important if you are taking diabetes medicines or blood thinners. ? Taking medicines such as aspirin and ibuprofen. These medicines can thin your blood. Do not take these medicines unless your health care provider tells you to take them. ? Taking over-the-counter medicines, vitamins, herbs, and supplements. Eating and drinking Follow instructions from your health care provider about eating and drinking, which may include:  8 hours before the procedure - stop eating heavy meals or foods, such as meat, fried foods, or fatty foods.  6 hours before the procedure - stop eating light meals or foods, such as toast or cereal.  6 hours before the procedure - stop drinking milk or drinks that contain milk.  2 hours before the procedure - stop drinking clear liquids. Staying hydrated Follow instructions from your health care provider about hydration, which may include:  Up to 2 hours before the procedure - you may continue to drink clear liquids, such as water, clear fruit juice, black coffee, and plain tea.  General instructions  You may have a physical exam before the procedure. You may also have tests, such as imaging tests and blood or urine tests.  If your ureter is too narrow, your health care provider may place a soft, flexible tube (stent) inside of it. The stent may be placed days or weeks before your laser therapy procedure.  Plan to have someone take you home from the hospital or clinic.  If you will be going home right after the procedure, plan to have someone stay with you for 24 hours.  Do not use any products that contain nicotine or tobacco for at least 4 weeks before the procedure.  These products include cigarettes, e-cigarettes, and chewing tobacco. If you need help quitting, ask your health care provider.  Ask your health care provider: ? How your surgical site will be marked or identified. ? What steps will be taken to help prevent infection. These may include:  Removing hair at the surgery site.  Washing skin with a germ-killing soap.  Taking antibiotic medicine. What happens during the procedure?   An IV will be inserted into one of your veins.  You will be given one or more of the following: ? A medicine to help you relax (sedative). ? A medicine to numb the area (local anesthetic). ? A medicine to make you fall asleep (general anesthetic).  A ureteroscope will be inserted into your urethra. The ureteroscope will send images to a video screen in the operating room to guide your surgeon to the area of your kidney that will  be treated.  A small, flexible tube will be threaded through the ureteroscope and into your bladder and ureter, up to your kidney.  The laser device will be inserted into your kidney through the tube. Your surgeon will pulse the laser on and off to break up kidney stones.  A surgical instrument that has a tiny wire basket may be inserted through the tube into your kidney to remove the pieces of broken kidney stone. The procedure may vary among health care providers and hospitals. What happens after the procedure?  Your blood pressure, heart rate, breathing rate, and blood oxygen level will be monitored until you leave the hospital or clinic.  You will be given pain medicine as needed.  You may continue to receive antibiotics.  You may have a stent temporarily placed in your ureter.  Do not drive for 24 hours if you were given a sedative during your procedure.  You may be given a strainer to collect any stone fragments that you pass in your urine. Your health care provider may have these tested. Summary  Laser therapy for  kidney stones is a procedure to break up kidney stones into pieces that are small enough to be passed out of the body through urination or removed during the procedure.  Follow instructions from your health care provider about eating and drinking before the procedure.  During the procedure, the ureteroscope will send images to a video screen to guide your surgeon to the area of your kidney that will be treated.  Do not drive for 24 hours if you were given a sedative during your procedure. This information is not intended to replace advice given to you by your health care provider. Make sure you discuss any questions you have with your health care provider. Document Revised: 10/24/2017 Document Reviewed: 10/24/2017 Elsevier Patient Education  2020 [employer].

## 2019-12-31 NOTE — Progress Notes (Addendum)
   12/31/2019 10:11 AM   Jeffery Strong 12/18/1940 [id number]  Reason for visit: Follow up BPH/LUTS, UTI, bladder stone, microscopic hematuria  HPI: I saw Jeffery Strong and his wife in urology clinic for the above issues today.  I originally saw him in late September 2021 after he had had a UTI and had a long history of weak urinary stream with straining and urgency, as well as microscopic hematuria.  He underwent cystoscopy on 12/16/2019 that showed a large prostate with a 1 cm bladder stone, but no suspicious lesions.  He completed a CT urogram on 12/24/2019 that showed a 90 g prostate, 1 cm bladder stone, 1 cm right renal pelvis stone, and no other suspicious findings.  I personally reviewed the CT.  Urinalysis today is grossly concerning for another infection with greater than 30 WBCs, moderate bacteria, 3-10 RBCs, no yeast, nitrite positive, trace leuk esterase.  Sent for culture.  Started on Bactrim DS twice daily x10 days.  I recommended cystolitholapaxy, outlet procedure with either HOLEP or UroLift, as well as consideration of treatment of his 1 cm right renal pelvis stone simultaneously.  We discussed the differences between HOLEP and UroLift, and he would like to pursue HOLEP which is very reasonable with his severe urinary symptoms, history of recurrent UTIs, and very large prostate of 90 g.  We discussed the risks and benefits of HoLEP at length.  The procedure requires general anesthesia and takes 2 to 3 hours, and a holmium laser is used to enucleate the prostate and push this tissue into the bladder.  A morcellator is then used to remove this tissue, which is sent for pathology.  The vast majority of patients are able to discharge the same day with a catheter in place for 2 to 3 days, and will follow-up in clinic for a voiding trial.  Approximately 5% of patients will be admitted overnight to monitor the urine, or if they have multiple co-morbidities.  We specifically discussed the risks of  bleeding, infection, retrograde ejaculation, temporary urgency and urge incontinence, very low risk of long-term incontinence, pathologic evaluation of prostate tissue and possible detection of prostate cancer or other malignancy, and possible need for additional procedures.  We specifically discussed the risks ureteroscopy including bleeding, infection/sepsis, stent related symptoms including flank pain/urgency/frequency/incontinence/dysuria, ureteral injury, inability to access stone, or need for staged or additional procedures.  Schedule cystolitholapaxy, HOLEP(90g), right ureteroscopy/laser lithotripsy/stent placement Bactrim DS twice daily for acute UTI, follow-up cultures  I spent 45 total minutes on the day of the encounter including pre-visit review of the medical record, face-to-face time with the patient, and post visit ordering of labs/imaging/tests.   Jeffery Revell C Kaydan Wong, MD  [facility] [address], [address] [place], [place] [address] [phone]   

## 2020-01-01 LAB — URINALYSIS, COMPLETE
Bilirubin, UA: NEGATIVE
Glucose, UA: NEGATIVE
Nitrite, UA: POSITIVE — AB
Specific Gravity, UA: >1.03 — ABNORMAL HIGH (ref 1.005–1.030)
Urobilinogen, Ur: 0.2 mg/dL (ref 0.2–1.0)
pH, UA: 5.5 (ref 5.0–7.5)

## 2020-01-01 LAB — MICROSCOPIC EXAMINATION: WBC, UA: >30 /hpf — AB (ref 0–5)

## 2020-01-04 ENCOUNTER — Telehealth: Payer: Self-pay | Admitting: Urology

## 2020-01-04 ENCOUNTER — Other Ambulatory Visit: Payer: Self-pay | Admitting: Urology

## 2020-01-04 DIAGNOSIS — N138 Other obstructive and reflux uropathy: Secondary | ICD-10-CM

## 2020-01-04 DIAGNOSIS — N401 Enlarged prostate with lower urinary tract symptoms: Secondary | ICD-10-CM

## 2020-01-04 DIAGNOSIS — N200 Calculus of kidney: Secondary | ICD-10-CM

## 2020-01-04 DIAGNOSIS — N210 Calculus in bladder: Secondary | ICD-10-CM

## 2020-01-04 NOTE — Telephone Encounter (Signed)
Called pt. To ask when he last took aspirin and pt. States Friday November 5,2021 was his last dose.

## 2020-01-05 ENCOUNTER — Other Ambulatory Visit: Payer: Self-pay

## 2020-01-05 ENCOUNTER — Encounter
Admission: RE | Admit: 2020-01-05 | Discharge: 2020-01-05 | Disposition: A | Discharge status: Home / Self Care | Payer: Medicare Other | Source: Ambulatory Visit | Attending: Urology | Admitting: Urology

## 2020-01-05 ENCOUNTER — Other Ambulatory Visit
Admission: RE | Admit: 2020-01-05 | Discharge: 2020-01-05 | Disposition: A | Discharge status: Home / Self Care | Payer: Medicare Other | Source: Ambulatory Visit | Attending: Urology | Admitting: Urology

## 2020-01-05 DIAGNOSIS — Z01818 Encounter for other preprocedural examination: Secondary | ICD-10-CM | POA: Diagnosis present

## 2020-01-05 DIAGNOSIS — Z20822 Contact with and (suspected) exposure to covid-19: Secondary | ICD-10-CM | POA: Diagnosis not present

## 2020-01-05 HISTORY — DX: Personal history of other diseases of the digestive system: Z87.19

## 2020-01-05 HISTORY — DX: Gastro-esophageal reflux disease without esophagitis: K21.9

## 2020-01-05 HISTORY — DX: Benign prostatic hyperplasia with lower urinary tract symptoms: N40.1

## 2020-01-05 HISTORY — DX: Anemia, unspecified: D64.9

## 2020-01-05 LAB — SARS CORONAVIRUS 2 (TAT 6-24 HRS): SARS Coronavirus 2: NEGATIVE

## 2020-01-05 NOTE — Patient Instructions (Addendum)
INSTRUCTIONS FOR SURGERY     Your surgery is scheduled for:   Friday, November 12TH     To find out your arrival time for the day of surgery,          please call [phone] between 1 pm and 3 pm on :  Thursday November 11TH     When you arrive for surgery, report to the REGISTRATION DESK. THEN YOU WILL BE SENT      TO THE SECOND FLOOR SURGICAL DESK.    REMEMBER: Instructions that are not followed completely may result in serious medical risk,  up to and including death, or upon the discretion of your surgeon and anesthesiologist,            your surgery may need to be rescheduled.  __X__ 1. Do not eat food after midnight the night before your procedure.                    No gum, candy, lozenger, tic tacs, tums or hard candies.                  ABSOLUTELY NOTHING SOLID IN YOUR MOUTH AFTER MIDNIGHT                    You may drink unlimited clear liquids up to 2 hours before you are scheduled to arrive for surgery.                   Do not drink anything within those 2 hours unless you need to take medicine, then take the                   smallest amount you need.  Clear liquids include:  water, apple juice without pulp,                   any flavor Gatorade, Black coffee, black tea.  Sugar may be added but no dairy/ honey /lemon.                        Broth and jello is not considered a clear liquid.  __x__  2. On the morning of surgery, please brush your teeth with toothpaste and water. You may rinse with                  mouthwash if you wish but DO NOT SWALLOW TOOTHPASTE OR MOUTHWASH  __X___3. NO alcohol for 24 hours before or after surgery.  __x___ 4.  Do NOT smoke or use e-cigarettes for 24 HOURS PRIOR TO SURGERY.                      DO NOT Use any chewable tobacco products for at least 6 hours prior to surgery.  __x___ 5. If you start any new medication after this appointment and prior to surgery, please                    Bring it with you on the day of surgery.  ___x__ 6. Notify your doctor if there is any change in your medical condition,  such as fever, infection, vomitting,                   Diarrhea or any open sores.  __x___ 7.  USE  THE ANTIBACTERIAL SOAP as instructed, the night before surgery OR the day of surgery.                   Once you have washed with this soap, do NOT use any of the following: Powders, perfumes                    or lotions. Please do not wear make up, hairpins, clips or nail polish. You MAY wear deodorant.                   Men may shave their face and neck.  Women need to shave 48 hours prior to surgery.                   DO NOT wear ANY jewelry on the day of surgery. If there are rings that are too tight to                    remove easily, please address this prior to the surgery day. Piercings need to be removed.                                                                     NO METAL ON YOUR BODY.                    Do NOT bring any valuables.  If you came to Pre-Admit testing then you will not need license,                     insurance card or credit card.  If you will be staying overnight, please either leave your things in                     the car or have your family be responsible for these items.                     [facility] IS NOT RESPONSIBLE FOR BELONGINGS OR VALUABLES.  ___X__ 8. DO NOT wear contact lenses on surgery day.  You may not have dentures,                     Hearing aides, contacts or glasses in the operating room. These items can be                    Placed in the Recovery Room to receive immediately after surgery.  __x___ 9. IF YOU ARE SCHEDULED TO GO HOME ON THE SAME DAY, YOU MUST                   Have someone to drive you home and to stay with you  for the first 24 hours.                    Have an arrangement prior to arriving on surgery day.  ___x__ 10. Take the following medications on the morning  of surgery with a sip of  water:                              1. METOPROLOL                     2. SELEGILINE                     3. BACTRIM (depending on time of surgery)                     4.  _____ 11.  Follow any instructions provided to you by your surgeon.                        Such as enema, clear liquid bowel prep  __X__  12. STOP ALL ASPIRIN PRODUCTS AS OF TODAY.                       THIS INCLUDES BC POWDERS / GOODIES POWDER  __x___ 13. STOP Anti-inflammatories as of TODAY, November 9TH                      This includes IBUPROFEN / MOTRIN / ADVIL / ALEVE/ NAPROXYN                    YOU MAY TAKE TYLENOL ANY TIME PRIOR TO SURGERY.  ___x___18.   Wear clean and comfortable clothing to the hospital.

## 2020-01-06 ENCOUNTER — Other Ambulatory Visit: Payer: Medicare Other

## 2020-01-07 ENCOUNTER — Ambulatory Visit: Payer: Medicare Other | Admitting: Urology

## 2020-01-07 ENCOUNTER — Ambulatory Visit: Payer: Self-pay | Admitting: Urology

## 2020-01-07 LAB — CULTURE, URINE COMPREHENSIVE

## 2020-01-08 ENCOUNTER — Ambulatory Visit
Admission: RE | Admit: 2020-01-08 | Discharge: 2020-01-08 | Disposition: A | Discharge status: Home / Self Care | Payer: Medicare Other | Attending: Urology | Admitting: Urology

## 2020-01-08 ENCOUNTER — Ambulatory Visit: Payer: Medicare Other

## 2020-01-08 ENCOUNTER — Ambulatory Visit: Payer: Medicare Other | Admitting: Urgent Care

## 2020-01-08 ENCOUNTER — Ambulatory Visit: Payer: Medicare Other | Admitting: Registered Nurse

## 2020-01-08 ENCOUNTER — Other Ambulatory Visit: Payer: Self-pay

## 2020-01-08 ENCOUNTER — Encounter: Payer: Self-pay | Admitting: Urology

## 2020-01-08 ENCOUNTER — Encounter
Admission: RE | Disposition: A | Discharge status: Home / Self Care | Payer: Self-pay | Source: Home / Self Care | Attending: Urology

## 2020-01-08 DIAGNOSIS — R3914 Feeling of incomplete bladder emptying: Secondary | ICD-10-CM

## 2020-01-08 DIAGNOSIS — N401 Enlarged prostate with lower urinary tract symptoms: Secondary | ICD-10-CM

## 2020-01-08 DIAGNOSIS — N390 Urinary tract infection, site not specified: Secondary | ICD-10-CM | POA: Diagnosis not present

## 2020-01-08 DIAGNOSIS — Z87891 Personal history of nicotine dependence: Secondary | ICD-10-CM | POA: Insufficient documentation

## 2020-01-08 DIAGNOSIS — N210 Calculus in bladder: Secondary | ICD-10-CM | POA: Insufficient documentation

## 2020-01-08 DIAGNOSIS — N138 Other obstructive and reflux uropathy: Secondary | ICD-10-CM

## 2020-01-08 DIAGNOSIS — N200 Calculus of kidney: Secondary | ICD-10-CM | POA: Insufficient documentation

## 2020-01-08 DIAGNOSIS — Z87440 Personal history of urinary (tract) infections: Secondary | ICD-10-CM | POA: Insufficient documentation

## 2020-01-08 HISTORY — PX: CYSTOSCOPY WITH LITHOLAPAXY: SHX1425

## 2020-01-08 HISTORY — PX: CYSTOSCOPY/URETEROSCOPY/HOLMIUM LASER/STENT PLACEMENT: SHX6546

## 2020-01-08 HISTORY — PX: HOLEP-LASER ENUCLEATION OF THE PROSTATE WITH MORCELLATION: SHX6641

## 2020-01-08 SURGERY — ENUCLEATION, PROSTATE, USING LASER, WITH MORCELLATION
Anesthesia: General | Laterality: Right

## 2020-01-08 MED ORDER — IOHEXOL 180 MG/ML  SOLN
INTRAMUSCULAR | Status: DC | PRN
  Administered 2020-01-08: 20 mL

## 2020-01-08 MED ORDER — BELLADONNA ALKALOIDS-OPIUM 16.2-60 MG RE SUPP
RECTAL | Status: AC
  Filled 2020-01-08: qty 1

## 2020-01-08 MED ORDER — FENTANYL CITRATE (PF) 100 MCG/2ML IJ SOLN
25.0000 ug | INTRAMUSCULAR | Status: DC | PRN
  Administered 2020-01-08: 25 ug via INTRAVENOUS

## 2020-01-08 MED ORDER — ROCURONIUM BROMIDE 10 MG/ML (PF) SYRINGE
PREFILLED_SYRINGE | INTRAVENOUS | Status: AC
  Filled 2020-01-08: qty 10

## 2020-01-08 MED ORDER — ORAL CARE MOUTH RINSE: 15.0000 mL | Freq: Once | OROMUCOSAL | Status: AC

## 2020-01-08 MED ORDER — HYDRALAZINE HCL 20 MG/ML IJ SOLN
INTRAMUSCULAR | Status: AC
  Filled 2020-01-08: qty 1

## 2020-01-08 MED ORDER — GENTAMICIN SULFATE 40 MG/ML IJ SOLN
5.0000 mg/kg | Freq: Once | INTRAVENOUS | Status: DC
  Filled 2020-01-08: qty 11

## 2020-01-08 MED ORDER — SULFAMETHOXAZOLE-TRIMETHOPRIM 800-160 MG PO TABS: 1.0000 | ORAL_TABLET | Freq: Every day | ORAL | 0 refills | Status: DC

## 2020-01-08 MED ORDER — HYDROCODONE-ACETAMINOPHEN 5-325 MG PO TABS: 1.0000 | ORAL_TABLET | ORAL | 0 refills | Status: AC | PRN

## 2020-01-08 MED ORDER — GLYCOPYRROLATE 0.2 MG/ML IJ SOLN
INTRAMUSCULAR | Status: AC
  Filled 2020-01-08: qty 1

## 2020-01-08 MED ORDER — PHENYLEPHRINE HCL (PRESSORS) 10 MG/ML IV SOLN
INTRAVENOUS | Status: DC | PRN
  Administered 2020-01-08 (×2): 100 ug via INTRAVENOUS
  Administered 2020-01-08: 25 ug via INTRAVENOUS
  Administered 2020-01-08 (×3): 100 ug via INTRAVENOUS
  Administered 2020-01-08: 150 ug via INTRAVENOUS
  Administered 2020-01-08: 75 ug via INTRAVENOUS
  Administered 2020-01-08 (×3): 100 ug via INTRAVENOUS
  Administered 2020-01-08: 50 ug via INTRAVENOUS
  Administered 2020-01-08: 100 ug via INTRAVENOUS

## 2020-01-08 MED ORDER — TAMSULOSIN HCL 0.4 MG PO CAPS: 0.4000 mg | ORAL_CAPSULE | Freq: Every day | ORAL | 0 refills | Status: DC

## 2020-01-08 MED ORDER — LACTATED RINGERS IV SOLN: INTRAVENOUS | Status: DC

## 2020-01-08 MED ORDER — FENTANYL CITRATE (PF) 100 MCG/2ML IJ SOLN
INTRAMUSCULAR | Status: AC
  Filled 2020-01-08: qty 2

## 2020-01-08 MED ORDER — DEXTROSE 5 % IV SOLN: INTRAVENOUS | Status: DC | PRN

## 2020-01-08 MED ORDER — CHLORHEXIDINE GLUCONATE 0.12 % MT SOLN
OROMUCOSAL | Status: AC
  Administered 2020-01-08: 15 mL via OROMUCOSAL
  Filled 2020-01-08: qty 15

## 2020-01-08 MED ORDER — HYDRALAZINE HCL 20 MG/ML IJ SOLN
INTRAMUSCULAR | Status: DC | PRN
  Administered 2020-01-08: 5 mg via INTRAVENOUS

## 2020-01-08 MED ORDER — GLYCOPYRROLATE 0.2 MG/ML IJ SOLN
INTRAMUSCULAR | Status: DC | PRN
  Administered 2020-01-08: .1 mg via INTRAVENOUS

## 2020-01-08 MED ORDER — CHLORHEXIDINE GLUCONATE 0.12 % MT SOLN: 15.0000 mL | Freq: Once | OROMUCOSAL | Status: AC

## 2020-01-08 MED ORDER — HYDROCODONE-ACETAMINOPHEN 5-325 MG PO TABS
1.0000 | ORAL_TABLET | ORAL | Status: DC | PRN
  Administered 2020-01-08: 1 via ORAL

## 2020-01-08 MED ORDER — CEFAZOLIN SODIUM-DEXTROSE 2-4 GM/100ML-% IV SOLN
INTRAVENOUS | Status: AC
  Filled 2020-01-08: qty 100

## 2020-01-08 MED ORDER — ACETAMINOPHEN 10 MG/ML IV SOLN
INTRAVENOUS | Status: AC
  Filled 2020-01-08: qty 100

## 2020-01-08 MED ORDER — DEXAMETHASONE SODIUM PHOSPHATE 10 MG/ML IJ SOLN
INTRAMUSCULAR | Status: AC
  Filled 2020-01-08: qty 1

## 2020-01-08 MED ORDER — LIDOCAINE HCL (CARDIAC) PF 100 MG/5ML IV SOSY
PREFILLED_SYRINGE | INTRAVENOUS | Status: DC | PRN
  Administered 2020-01-08: 100 mg via INTRAVENOUS

## 2020-01-08 MED ORDER — ONDANSETRON HCL 4 MG/2ML IJ SOLN
INTRAMUSCULAR | Status: AC
  Filled 2020-01-08: qty 2

## 2020-01-08 MED ORDER — DEXMEDETOMIDINE HCL 200 MCG/2ML IV SOLN
INTRAVENOUS | Status: DC | PRN
  Administered 2020-01-08: 12 ug via INTRAVENOUS
  Administered 2020-01-08: 8 ug via INTRAVENOUS

## 2020-01-08 MED ORDER — CEFAZOLIN SODIUM-DEXTROSE 2-4 GM/100ML-% IV SOLN
2.0000 g | INTRAVENOUS | Status: AC
  Administered 2020-01-08: 2 g via INTRAVENOUS

## 2020-01-08 MED ORDER — LIDOCAINE HCL (PF) 2 % IJ SOLN
INTRAMUSCULAR | Status: AC
  Filled 2020-01-08: qty 5

## 2020-01-08 MED ORDER — SUGAMMADEX SODIUM 200 MG/2ML IV SOLN
INTRAVENOUS | Status: DC | PRN
  Administered 2020-01-08: 200 mg via INTRAVENOUS

## 2020-01-08 MED ORDER — HYDROCODONE-ACETAMINOPHEN 5-325 MG PO TABS
ORAL_TABLET | ORAL | Status: AC
  Filled 2020-01-08: qty 1

## 2020-01-08 MED ORDER — FENTANYL CITRATE (PF) 100 MCG/2ML IJ SOLN
INTRAMUSCULAR | Status: DC | PRN
  Administered 2020-01-08: 50 ug via INTRAVENOUS
  Administered 2020-01-08 (×2): 25 ug via INTRAVENOUS

## 2020-01-08 MED ORDER — ACETAMINOPHEN 10 MG/ML IV SOLN
INTRAVENOUS | Status: DC | PRN
  Administered 2020-01-08: 1000 mg via INTRAVENOUS

## 2020-01-08 MED ORDER — DEXAMETHASONE SODIUM PHOSPHATE 10 MG/ML IJ SOLN
INTRAMUSCULAR | Status: DC | PRN
  Administered 2020-01-08: 4 mg via INTRAVENOUS

## 2020-01-08 MED ORDER — ONDANSETRON HCL 4 MG/2ML IJ SOLN
INTRAMUSCULAR | Status: DC | PRN
  Administered 2020-01-08: 4 mg via INTRAVENOUS

## 2020-01-08 MED ORDER — DEXTROSE 5 % IV SOLN
INTRAVENOUS | Status: DC | PRN
  Administered 2020-01-08: 117 mL via INTRAVENOUS

## 2020-01-08 MED ORDER — PROPOFOL 10 MG/ML IV BOLUS
INTRAVENOUS | Status: DC | PRN
  Administered 2020-01-08: 20 mg via INTRAVENOUS
  Administered 2020-01-08: 150 mg via INTRAVENOUS

## 2020-01-08 MED ORDER — ONDANSETRON HCL 4 MG/2ML IJ SOLN: 4.0000 mg | Freq: Once | INTRAMUSCULAR | Status: DC | PRN

## 2020-01-08 MED ORDER — PROPOFOL 10 MG/ML IV BOLUS
INTRAVENOUS | Status: AC
  Filled 2020-01-08: qty 20

## 2020-01-08 MED ORDER — ROCURONIUM BROMIDE 100 MG/10ML IV SOLN
INTRAVENOUS | Status: DC | PRN
  Administered 2020-01-08: 10 mg via INTRAVENOUS
  Administered 2020-01-08: 20 mg via INTRAVENOUS
  Administered 2020-01-08: 50 mg via INTRAVENOUS

## 2020-01-08 SURGICAL SUPPLY — 61 items
ADAPTER IRRIG TUBE 2 SPIKE SOL (ADAPTER) ×8 IMPLANT
ADPR TBG 2 SPK PMP STRL ASCP (ADAPTER) ×4
BAG DRAIN CYSTO-URO LG1000N (MISCELLANEOUS) ×4 IMPLANT
BAG DRN LRG CPC RND TRDRP CNTR (MISCELLANEOUS) ×2
BAG URO DRAIN 4000ML (MISCELLANEOUS) ×4 IMPLANT
BRUSH SCRUB EZ 1% IODOPHOR (MISCELLANEOUS) ×4 IMPLANT
CATH FOLEY 3WAY 30CC 24FR (CATHETERS) ×8
CATH URETL 5X70 OPEN END (CATHETERS) ×4 IMPLANT
CATH URTH STD 24FR FL 3W 2 (CATHETERS) ×2 IMPLANT
CNTNR SPEC 2.5X3XGRAD LEK (MISCELLANEOUS)
CONT SPEC 4OZ STER OR WHT (MISCELLANEOUS)
CONT SPEC 4OZ STRL OR WHT (MISCELLANEOUS)
CONTAINER COLLECT MORCELLATR (MISCELLANEOUS) ×2 IMPLANT
CONTAINER SPEC 2.5X3XGRAD LEK (MISCELLANEOUS) IMPLANT
DRAPE UTILITY 15X26 TOWEL STRL (DRAPES) ×4 IMPLANT
ELECT BIVAP BIPO 22/24 DONUT (ELECTROSURGICAL)
ELECTRD BIVAP BIPO 22/24 DONUT (ELECTROSURGICAL) IMPLANT
FIBER LASER FLEXIVA 550 (UROLOGICAL SUPPLIES) IMPLANT
FIBER LASER FLEXIVA PULSE 550 (Laser) ×4 IMPLANT
FIBER LASER TRAC TIP (UROLOGICAL SUPPLIES) IMPLANT
FILTER OVERFLOW MORCELLATOR (FILTER) ×2 IMPLANT
GLOVE BIOGEL PI IND STRL 7.5 (GLOVE) ×2 IMPLANT
GLOVE BIOGEL PI INDICATOR 7.5 (GLOVE) ×2
GOWN STRL REUS W/ TWL LRG LVL3 (GOWN DISPOSABLE) ×2 IMPLANT
GOWN STRL REUS W/ TWL XL LVL3 (GOWN DISPOSABLE) ×2 IMPLANT
GOWN STRL REUS W/TWL LRG LVL3 (GOWN DISPOSABLE) ×4
GOWN STRL REUS W/TWL XL LVL3 (GOWN DISPOSABLE) ×4
GUIDEWIRE GREEN .038 145CM (MISCELLANEOUS) ×2 IMPLANT
GUIDEWIRE STR DUAL SENSOR (WIRE) ×4 IMPLANT
HOLDER FOLEY CATH W/STRAP (MISCELLANEOUS) ×4 IMPLANT
INFUSOR MANOMETER BAG 3000ML (MISCELLANEOUS) ×4 IMPLANT
INTRODUCER DILATOR DOUBLE (INTRODUCER) IMPLANT
IV NS IRRIG 3000ML ARTHROMATIC (IV SOLUTION) ×4 IMPLANT
KIT PROBE TRILOGY 3.9X350 (MISCELLANEOUS) IMPLANT
KIT TURNOVER CYSTO (KITS) ×4 IMPLANT
MANIFOLD NEPTUNE II (INSTRUMENTS) ×4 IMPLANT
MBRN O SEALING YLW 17 FOR INST (MISCELLANEOUS) ×4
MEMBRANE SLNG YLW 17 FOR INST (MISCELLANEOUS) ×2 IMPLANT
MORCELLATOR COLLECT CONTAINER (MISCELLANEOUS) ×4
MORCELLATOR OVERFLOW FILTER (FILTER) ×4
MORCELLATOR ROTATION 4.75 335 (MISCELLANEOUS) ×4 IMPLANT
PACK CYSTO AR (MISCELLANEOUS) ×4 IMPLANT
SCOPE LITHOVU DISP 9.5FR 7.7FR (UROLOGICAL SUPPLIES) IMPLANT
SCOPE LITHOVUE DISPOSABLE (UROLOGICAL SUPPLIES) ×4
SET CYSTO W/LG BORE CLAMP LF (SET/KITS/TRAYS/PACK) ×4 IMPLANT
SET IRRIG Y TYPE TUR BLADDER L (SET/KITS/TRAYS/PACK) ×4 IMPLANT
SHEATH URETERAL 12FRX35CM (MISCELLANEOUS) IMPLANT
SLEEVE PROTECTION STRL DISP (MISCELLANEOUS) ×8 IMPLANT
SOL .9 NS 3000ML IRR  AL (IV SOLUTION) ×22
SOL .9 NS 3000ML IRR AL (IV SOLUTION) ×22
SOL .9 NS 3000ML IRR UROMATIC (IV SOLUTION) ×8 IMPLANT
STENT URET 6FRX24 CONTOUR (STENTS) IMPLANT
STENT URET 6FRX26 CONTOUR (STENTS) ×2 IMPLANT
SURGILUBE 2OZ TUBE FLIPTOP (MISCELLANEOUS) ×4 IMPLANT
SYR 10ML LL (SYRINGE) ×4 IMPLANT
SYR TOOMEY IRRIG 70ML (MISCELLANEOUS) ×4
SYRINGE TOOMEY IRRIG 70ML (MISCELLANEOUS) ×2 IMPLANT
TUBE PUMP MORCELLATOR PIRANHA (TUBING) ×4 IMPLANT
VALVE UROSEAL ADJ ENDO (VALVE) IMPLANT
WATER STERILE IRR 1000ML POUR (IV SOLUTION) ×6 IMPLANT
WIRE AMPLATZ SSTIFF .035X260CM (WIRE) ×2 IMPLANT

## 2020-01-08 NOTE — Anesthesia Postprocedure Evaluation (Signed)
Anesthesia Post Note  Patient: Jeffery Strong  Procedure(s) Performed: HOLEP-LASER ENUCLEATION OF THE PROSTATE WITH MORCELLATION (N/A ) CYSTOSCOPY WITH LITHOLAPAXY (N/A ) CYSTOSCOPY/URETEROSCOPY/HOLMIUM LASER/STENT PLACEMENT (Right )  Patient location during evaluation: PACU Anesthesia Type: General Level of consciousness: awake and alert Pain management: pain level controlled Vital Signs Assessment: post-procedure vital signs reviewed and stable Respiratory status: spontaneous breathing, nonlabored ventilation, respiratory function stable and patient connected to nasal cannula oxygen Cardiovascular status: blood pressure returned to baseline and stable Postop Assessment: no apparent nausea or vomiting Anesthetic complications: no   No complications documented.   Last Vitals:  Vitals:   01/08/20 1654 01/08/20 1706  BP:    Pulse: 68 72  Resp: 12 19  Temp:    SpO2: 100% 97%    Last Pain:  Vitals:   01/08/20 1706  TempSrc:   PainSc: 6                  Yevette Edwards

## 2020-01-08 NOTE — Anesthesia Preprocedure Evaluation (Signed)
Anesthesia Evaluation  Patient identified by MRN, date of birth, ID band Patient awake    Reviewed: Allergy & Precautions, NPO status , Patient's Chart, lab work & pertinent test results  History of Anesthesia Complications Negative for: history of anesthetic complications  Airway Mallampati: III       Dental  (+) Upper Dentures, Lower Dentures, Edentulous Upper, Edentulous Lower   Pulmonary neg pulmonary ROS, former smoker,    breath sounds clear to auscultation       Cardiovascular hypertension, Pt. on medications (-) angina+ CAD and + Cardiac Stents  (-) Past MI (-) dysrhythmias (-) Valvular Problems/Murmurs Rhythm:Regular Rate:Normal     Neuro/Psych neg Seizures PSYCHIATRIC DISORDERS Anxiety Depression CVA, No Residual Symptoms    GI/Hepatic Neg liver ROS, hiatal hernia, GERD  ,  Endo/Other  negative endocrine ROS  Renal/GU Renal disease (kidney stones)     Musculoskeletal negative musculoskeletal ROS (+)   Abdominal Normal abdominal exam  (+)   Peds negative pediatric ROS (+)  Hematology negative hematology ROS (+)   Anesthesia Other Findings Past Medical History: No date: Anemia     Comment:  B12 deficiency No date: BPH associated with nocturia No date: CAD (coronary artery disease) No date: GERD (gastroesophageal reflux disease)     Comment:  occasionally.  not bad anymore. does not take meds No date: History of hiatal hernia 12/2019: History of kidney stones     Comment:  history of stones and current stones No date: HLD (hyperlipidemia) No date: Hypertension No date: Parkinson disease (HCC)     Comment:  right side per pt report 06/2018: Stroke (HCC)     Comment:  slight balance issues   Reproductive/Obstetrics                             Anesthesia Physical  Anesthesia Plan  ASA: III  Anesthesia Plan: General   Post-op Pain Management:    Induction:  Intravenous  PONV Risk Score and Plan: 2 and Ondansetron, Dexamethasone and Treatment may vary due to age or medical condition  Airway Management Planned: Oral ETT  Additional Equipment:   Intra-op Plan:   Post-operative Plan: Extubation in OR  Informed Consent: I have reviewed the patients History and Physical, chart, labs and discussed the procedure including the risks, benefits and alternatives for the proposed anesthesia with the patient or authorized representative who has indicated his/her understanding and acceptance.       Plan Discussed with: CRNA  Anesthesia Plan Comments:         Anesthesia Quick Evaluation

## 2020-01-08 NOTE — Anesthesia Procedure Notes (Signed)
Procedure Name: Intubation Date/Time: 01/08/2020 2:33 PM Performed by: Hezzie Bump, CRNA Pre-anesthesia Checklist: Patient identified, Patient being monitored, Timeout performed, Emergency Drugs available and Suction available Patient Re-evaluated:Patient Re-evaluated prior to induction Oxygen Delivery Method: Circle system utilized Preoxygenation: Pre-oxygenation with 100% oxygen Induction Type: IV induction Ventilation: Two handed mask ventilation required, Mask ventilation with difficulty and Oral airway inserted - appropriate to patient size Laryngoscope Size: McGraph and 4 Grade View: Grade I Tube type: Oral Tube size: 7.5 mm Number of attempts: 1 Airway Equipment and Method: Stylet Placement Confirmation: ETT inserted through vocal cords under direct vision,  positive ETCO2 and breath sounds checked- equal and bilateral Secured at: 21 cm Tube secured with: Tape Dental Injury: Teeth and Oropharynx as per pre-operative assessment  Comments: Difficult mask due to facial hair

## 2020-01-08 NOTE — Interval H&P Note (Signed)
UROLOGY H&P UPDATE  Agree with prior H&P dated 12/31/2019.  79 year old male with recurrent UTIs and severe urinary symptoms, as well as a 1 cm bladder stone.  Additionally, he has a 1 cm right renal pelvis stone that we will attempt to treat simultaneously.  Cardiac: RRR Lungs: CTA bilaterally  Laterality: right Procedure: HOLEP, right ureteroscopy, laser lithotripsy, stent placement  Urine: culture 11/4 with staph, has been on culture appropriate abx  We discussed the risks and benefits of HoLEP at length.  The procedure requires general anesthesia and takes 2 to 3 hours, and a holmium laser is used to enucleate the prostate and push this tissue into the bladder.  A morcellator is then used to remove this tissue, which is sent for pathology.  The vast majority of patients are able to discharge the same day with a catheter in place for 2 to 3 days, and will follow-up in clinic for a voiding trial.  Approximately 5% of patients will be admitted overnight to monitor the urine, or if they have multiple co-morbidities.  We specifically discussed the risks of bleeding, infection, retrograde ejaculation, temporary urgency and urge incontinence, very low risk of long-term incontinence, pathologic evaluation of prostate tissue and possible detection of prostate cancer or other malignancy, and possible need for additional procedures.  We specifically discussed the risks ureteroscopy including bleeding, infection/sepsis, stent related symptoms including flank pain/urgency/frequency/incontinence/dysuria, ureteral injury, inability to access stone, or need for staged or additional procedures.   Sondra Come, MD 01/08/2020

## 2020-01-08 NOTE — Op Note (Signed)
Date of procedure: 01/08/20  Preoperative diagnosis:  1. BPH and incomplete emptying with recurrent UTIs 2. Bladder stone 3. Right renal stone  Postoperative diagnosis:  1. Same  Procedure: 1. HoLEP (Holmium Laser Enucleation of the Prostate) 2.  Right ureteroscopy, laser lithotripsy, right retrograde pyelogram with intraoperative interpretation, right ureteral stent placement 3. Cystolitholapaxy 2 cm stone  Surgeon: Nickolas Madrid, MD  Anesthesia: General  Complications: None  Intraoperative findings:  1.  Uncomplicated dusting of right 1 cm renal stone and stent placement 2.  Bladder stone fragmented and pieces extracted 3.  Uncomplicated HOLEP  EBL: 10 mL  Specimens: Prostate chips  Enucleation time: 25 minutes  Morcellation time: 8 minutes  Intra-op weight: 46 g  Drains: 24 French three-way, 60 cc in balloon  Indication: HUTTON PELLICANE is a 79 y.o. patient with BPH and obstructive urinary symptoms, recurrent UTIs, bladder stone, as well as a 1 cm right renal stone.    After reviewing the management options for treatment, they elected to proceed with the above surgical procedure(s). We have discussed the potential benefits and risks of the procedure, side effects of the proposed treatment, the likelihood of the patient achieving the goals of the procedure, and any potential problems that might occur during the procedure or recuperation.  We specifically discussed the risks of bleeding, infection, hematuria and clot retention, need for additional procedures, possible overnight hospital stay, temporary urgency and incontinence, rare long-term incontinence, and retrograde ejaculation.  Informed consent has been obtained.   Description of procedure:  The patient was taken to the operating room and general anesthesia was induced.  The patient was placed in the dorsal lithotomy position, prepped and draped in the usual sterile fashion, and preoperative antibiotics(Ancef and  gentamicin) were administered.  SCDs were placed for DVT prophylaxis.  A preoperative time-out was performed.   Leander Rams sounds were used to gently dilated the urethra up to 3F.   A 21 French rigid cystoscope was used to intubate the urethra and a normal-appearing urethra was followed proximally into the bladder.  The prostate was large with obstructing lateral lobes.  Cystoscopy showed moderate bladder trabeculations, 2 cm bladder stone, but no other lesions.  A sensor wire was advanced up into the right kidney under fluoroscopic vision, and I attempted to pass the disposable ureteroscope over the wire but met resistance.  The sensor wire was exchanged for a Super Stiff wire, and the scope was able to be advanced up into the kidney.  Thorough pyeloscopy revealed a 1 cm black stone in the renal pelvis, and this was fragmented to dust using a 200 m laser fiber on settings of 0.5 J and 40 Hz.  Thorough pyeloscopy revealed no other stone fragments.  Contrast was injected from the proximal ureter and showed no extravasation or filling defect.  The wire was replaced through the scope, and the scope removed.  The rigid scope was backloaded over the wire, and a 6 [place] by 26 cm ureteral stent was uneventfully placed on the right side with a curl in the renal pelvis, as well as in the bladder.  Fluid was seen to drain through the side ports of the stent.  The 500 m laser fiber was then used on settings of 1.5 J and 15 Hz to fragment the 2 cm bladder stone, and these pieces were extracted from the bladder.  The 79 French continuous flow resectoscope was inserted into the urethra using the visual obturator.  The laser was set to  2 J and 50 Hz and was used to make an omega incision just proximal to the verumontanum.  I then made an incision at the 6 o'clock position to the level of the capsule from the bladder neck to the verumontanum.  The lateral lobes were then incised circumferentially until they were  disconnected from the surrounding tissue.  The capsule was examined and laser was used for meticulous hemostasis.    The 5 French resectoscope was then switched out for the 27 French nephroscope and the lobes were morcellated and the tissue sent to pathology.  A 24 French three-way catheter was inserted with the aid of a catheter guide, 60 cc were placed in the balloon, and I attempted to initiate CBI.  Water would not flow through the catheter, and ultimately I realize that it was a defective catheter with the inflow blocked.  The catheter was removed and I reinserted the scope.  The laser had been dropped onto the floor, so I used the Bugbee to achieve hemostasis around the bladder neck and at the 12 o'clock position.  I then placed a Super Stiff wire into the bladder and the scope was removed.  A new 24 French three-way catheter was turned into a Council catheter and advanced easily over the wire into the bladder.  The catheter irrigated easily with return of faint pink fluid.  CBI was initiated, and urine was clear on medium CBI.  The patient tolerated the procedure well without any immediate complications and was extubated and transferred to the recovery room in stable condition.    Disposition: Stable to PACU  Plan: Wean CBI in PACU, anticipate discharge home today with void trial in clinic in 2-3 days Stent removal in 1 week, continue Bactrim prophylaxis  Nickolas Madrid, MD 01/08/2020

## 2020-01-08 NOTE — Transfer of Care (Signed)
Immediate Anesthesia Transfer of Care Note  Patient: Jeffery Strong  Procedure(s) Performed: HOLEP-LASER ENUCLEATION OF THE PROSTATE WITH MORCELLATION (N/A ) CYSTOSCOPY WITH LITHOLAPAXY (N/A ) CYSTOSCOPY/URETEROSCOPY/HOLMIUM LASER/STENT PLACEMENT (Right )  Patient Location: PACU  Anesthesia Type:General  Level of Consciousness: drowsy and patient cooperative  Airway & Oxygen Therapy: Patient Spontanous Breathing and Patient connected to face mask oxygen  Post-op Assessment: Report given to RN and Post -op Vital signs reviewed and stable  Post vital signs: Reviewed and stable  Last Vitals:  Vitals Value Taken Time  BP 153/79 01/08/20 1647  Temp 36.3 C 01/08/20 1647  Pulse 66 01/08/20 1651  Resp 13 01/08/20 1651  SpO2 100 % 01/08/20 1651  Vitals shown include unvalidated device data.  Last Pain:  Vitals:   01/08/20 1647  TempSrc:   PainSc: Asleep         Complications: No complications documented.

## 2020-01-08 NOTE — Discharge Instructions (Addendum)
 Indwelling Urinary Catheter Care, Adult An indwelling urinary catheter is a thin tube that is put into your bladder. The tube helps to drain pee (urine) out of your body. The tube goes in through your urethra. Your urethra is where pee comes out of your body. Your pee will come out through the catheter, then it will go into a bag (drainage bag). Take good care of your catheter so it will work well. How to wear your catheter and bag Supplies needed  Sticky tape (adhesive tape) or a leg strap.  Alcohol wipe or soap and water (if you use tape).  A clean towel (if you use tape).  Large overnight bag.  Smaller bag (leg bag). Wearing your catheter Attach your catheter to your leg with tape or a leg strap.  Make sure the catheter is not pulled tight.  If a leg strap gets wet, take it off and put on a dry strap.  If you use tape to hold the bag on your leg: 1. Use an alcohol wipe or soap and water to wash your skin where the tape made it sticky before. 2. Use a clean towel to pat-dry that skin. 3. Use new tape to make the bag stay on your leg. Wearing your bags You should have been given a large overnight bag.  You may wear the overnight bag in the day or night.  Always have the overnight bag lower than your bladder.  Do not let the bag touch the floor.  Before you go to sleep, put a clean plastic bag in a wastebasket. Then hang the overnight bag inside the wastebasket. You should also have a smaller leg bag that fits under your clothes.  Always wear the leg bag below your knee.  Do not wear your leg bag at night. How to care for your skin and catheter Supplies needed  A clean washcloth.  Water and mild soap.  A clean towel. Caring for your skin and catheter      Clean the skin around your catheter every day: 1. Wash your hands with soap and water. 2. Wet a clean washcloth in warm water and mild soap. 3. Clean the skin around your urethra.  If you are  male:  Gently spread the folds of skin around your vagina (labia).  With the washcloth in your other hand, wipe the inner side of your labia on each side. Wipe from front to back.  If you are male:  Pull back any skin that covers the end of your penis (foreskin).  With the washcloth in your other hand, wipe your penis in small circles. Start wiping at the tip of your penis, then move away from the catheter.  Move the foreskin back in place, if needed. 4. With your free hand, hold the catheter close to where it goes into your body.  Keep holding the catheter during cleaning so it does not get pulled out. 5. With the washcloth in your other hand, clean the catheter.  Only wipe downward on the catheter.  Do not wipe upward toward your body. Doing this may push germs into your urethra and cause infection. 6. Use a clean towel to pat-dry the catheter and the skin around it. Make sure to wipe off all soap. 7. Wash your hands with soap and water.  Shower every day. Do not take baths.  Do not use cream, ointment, or lotion on the area where the catheter goes into your body, unless your doctor tells   you to.  Do not use powders, sprays, or lotions on your genital area.  Check your skin around the catheter every day for signs of infection. Check for: ? Redness, swelling, or pain. ? Fluid or blood. ? Warmth. ? Pus or a bad smell. How to empty the bag Supplies needed  Rubbing alcohol.  Gauze pad or cotton ball.  Tape or a leg strap. Emptying the bag Pour the pee out of your bag when it is ?- full, or at least 2-3 times a day. Do this for your overnight bag and your leg bag. 1. Wash your hands with soap and water. 2. Separate (detach) the bag from your leg. 3. Hold the bag over the toilet or a clean pail. Keep the bag lower than your hips and bladder. This is so the pee (urine) does not go back into the tube. 4. Open the pour spout. It is at the bottom of the bag. 5. Empty the  pee into the toilet or pail. Do not let the pour spout touch any surface. 6. Put rubbing alcohol on a gauze pad or cotton ball. 7. Use the gauze pad or cotton ball to clean the pour spout. 8. Close the pour spout. 9. Attach the bag to your leg with tape or a leg strap. 10. Wash your hands with soap and water. Follow instructions for cleaning the drainage bag:  From the product maker.  As told by your doctor. How to change the bag Supplies needed  Alcohol wipes.  A clean bag.  Tape or a leg strap. Changing the bag Replace your bag when it starts to leak, smell bad, or look dirty. 1. Wash your hands with soap and water. 2. Separate the dirty bag from your leg. 3. Pinch the catheter with your fingers so that pee does not spill out. 4. Separate the catheter tube from the bag tube where these tubes connect (at the connection valve). Do not let the tubes touch any surface. 5. Clean the end of the catheter tube with an alcohol wipe. Use a different alcohol wipe to clean the end of the bag tube. 6. Connect the catheter tube to the tube of the clean bag. 7. Attach the clean bag to your leg with tape or a leg strap. Do not make the bag tight on your leg. 8. Wash your hands with soap and water. General rules   Never pull on your catheter. Never try to take it out. Doing that can hurt you.  Always wash your hands before and after you touch your catheter or bag. Use a mild, fragrance-free soap. If you do not have soap and water, use hand sanitizer.  Always make sure there are no twists or bends (kinks) in the catheter tube.  Always make sure there are no leaks in the catheter or bag.  Drink enough fluid to keep your pee pale yellow.  Do not take baths, swim, or use a hot tub.  If you are male, wipe from front to back after you poop (have a bowel movement). Contact a doctor if:  Your pee is cloudy.  Your pee smells worse than usual.  Your catheter gets clogged.  Your catheter  leaks.  Your bladder feels full. Get help right away if:  You have redness, swelling, or pain where the catheter goes into your body.  You have fluid, blood, pus, or a bad smell coming from the area where the catheter goes into your body.  Your skin feels warm   where the catheter goes into your body.  You have a fever.  You have pain in your: ? Belly (abdomen). ? Legs. ? Lower back. ? Bladder.  You see blood in the catheter.  Your pee is pink or red.  You feel sick to your stomach (nauseous).  You throw up (vomit).  You have chills.  Your pee is not draining into the bag.  Your catheter gets pulled out. Summary  An indwelling urinary catheter is a thin tube that is placed into the bladder to help drain pee (urine) out of the body.  The catheter is placed into the part of the body that drains pee from the bladder (urethra).  Taking good care of your catheter will keep it working properly and help prevent problems.  Always wash your hands before and after touching your catheter or bag.  Never pull on your catheter or try to take it out. This information is not intended to replace advice given to you by your health care provider. Make sure you discuss any questions you have with your health care provider. Document Revised: 06/06/2018 Document Reviewed: 09/28/2016 Elsevier Patient Education  2020 [employer]     AMBULATORY SURGERY  DISCHARGE INSTRUCTIONS   1) The drugs that you were given will stay in your system until tomorrow so for the next 24 hours you should not:  A) Drive an automobile B) Make any legal decisions C) Drink any alcoholic beverage   2) You may resume regular meals tomorrow.  Today it is better to start with liquids and gradually work up to solid foods.  You may eat anything you prefer, but it is better to start with liquids, then soup and crackers, and gradually work up to solid foods.   3) Please notify your doctor immediately if  you have any unusual bleeding, trouble breathing, redness and pain at the surgery site, drainage, fever, or pain not relieved by medication.    4) Additional Instructions:        Please contact your physician with any problems or Same Day Surgery at [phone], Monday through Friday 6 am to 4 pm, or [place] [facility] [employer] Main number at [phone]. 

## 2020-01-09 ENCOUNTER — Encounter: Payer: Self-pay | Admitting: Urology

## 2020-01-11 ENCOUNTER — Ambulatory Visit: Payer: Medicare Other | Admitting: Physician Assistant

## 2020-01-11 ENCOUNTER — Other Ambulatory Visit: Payer: Self-pay

## 2020-01-11 ENCOUNTER — Ambulatory Visit (INDEPENDENT_AMBULATORY_CARE_PROVIDER_SITE_OTHER): Payer: Medicare Other | Admitting: Physician Assistant

## 2020-01-11 VITALS — BP 112/69 | HR 81 | Ht 70.0 in | Wt 190.0 lb

## 2020-01-11 DIAGNOSIS — N138 Other obstructive and reflux uropathy: Secondary | ICD-10-CM

## 2020-01-11 DIAGNOSIS — N401 Enlarged prostate with lower urinary tract symptoms: Secondary | ICD-10-CM | POA: Diagnosis not present

## 2020-01-11 DIAGNOSIS — R110 Nausea: Secondary | ICD-10-CM

## 2020-01-11 DIAGNOSIS — Z9889 Other specified postprocedural states: Secondary | ICD-10-CM

## 2020-01-11 LAB — BLADDER SCAN AMB NON-IMAGING

## 2020-01-11 MED ORDER — ONDANSETRON HCL 4 MG PO TABS: 4.0000 mg | ORAL_TABLET | Freq: Three times a day (TID) | ORAL | 0 refills | Status: AC | PRN

## 2020-01-11 MED ORDER — DOCUSATE SODIUM 100 MG PO CAPS: 100.0000 mg | ORAL_CAPSULE | Freq: Two times a day (BID) | ORAL | 0 refills | Status: DC

## 2020-01-11 NOTE — Progress Notes (Addendum)
Afternoon follow-up  Patient returned to clinic this afternoon for repeat PVR. He reports drinking approximately 28oz of fluid. He has been able to urinate. He has had urinary leakage. PVR 36mL.  Results for orders placed or performed in visit on 01/11/20  BLADDER SCAN AMB NON-IMAGING  Result Value Ref Range   Scan Result 61mL     Voiding trial passed. Counseled patient on normal postoperative findings including dysuria, gross hematuria, and urinary leakage. Counseled him to start Kegel exercises 3x10 sets daily to increase urinary control and resume normal fluid intake.  Surgical pathology pending, will defer to Dr. Richardo Hanks to inform patient of results.  Additionally, patient notes some nausea with meals since surgery. He has not yet had a BM. Not on stool softeners. Counseled patient to start Colace and prescribed Zofran PRN for likely constipation-associated postoperative nausea.  Follow up: 3 days for cysto stent removal, 9 weeks for postop visit with Dr. Richardo Hanks (already scheduled). Continue Bactrim ppx.

## 2020-01-11 NOTE — Patient Instructions (Signed)

## 2020-01-11 NOTE — Progress Notes (Signed)
Catheter Removal  Patient is present today for a catheter removal.  60ml of water was drained from the balloon. A 24FR three way foley cath was removed from the bladder no complications were noted . Patient tolerated well.  Performed by: Zaydn Gutridge, PA-C   Follow up/ Additional notes: Push fluids and RTC this afternoon for PVR.  

## 2020-01-11 NOTE — Addendum Note (Signed)
Addended by: Debarah Crape on: 01/11/2020 02:11 PM   Modules accepted: Orders

## 2020-01-12 LAB — SURGICAL PATHOLOGY

## 2020-01-13 ENCOUNTER — Telehealth: Payer: Self-pay

## 2020-01-13 NOTE — Telephone Encounter (Signed)
Called pt informed him of the information below. Pt gave verbal understanding.  

## 2020-01-13 NOTE — Telephone Encounter (Signed)
-----   Message from Sondra Come, MD sent at 01/12/2020  3:17 PM EST ----- Good news, no prostate cancer seen on HOLEP specimen, keep follow up as scheduled for stent removal(had kidney stone treated at same time)  Legrand Rams, MD 01/12/2020

## 2020-01-14 ENCOUNTER — Ambulatory Visit (INDEPENDENT_AMBULATORY_CARE_PROVIDER_SITE_OTHER): Payer: Medicare Other | Admitting: Urology

## 2020-01-14 ENCOUNTER — Other Ambulatory Visit: Payer: Self-pay

## 2020-01-14 ENCOUNTER — Encounter: Payer: Self-pay | Admitting: Urology

## 2020-01-14 VITALS — BP 127/71 | HR 72 | Ht 70.0 in | Wt 189.4 lb

## 2020-01-14 DIAGNOSIS — N200 Calculus of kidney: Secondary | ICD-10-CM | POA: Diagnosis not present

## 2020-01-14 DIAGNOSIS — Z466 Encounter for fitting and adjustment of urinary device: Secondary | ICD-10-CM

## 2020-01-14 MED ORDER — SULFAMETHOXAZOLE-TRIMETHOPRIM 800-160 MG PO TABS
1.0000 | ORAL_TABLET | Freq: Once | ORAL | Status: AC
  Administered 2020-01-14: 1 via ORAL

## 2020-01-14 MED ORDER — LIDOCAINE HCL URETHRAL/MUCOSAL 2 % EX GEL
1.0000 "application " | Freq: Once | CUTANEOUS | Status: AC
  Administered 2020-01-14: 1 via URETHRAL

## 2020-01-14 MED ORDER — FLUCONAZOLE 100 MG PO TABS: 100.0000 mg | ORAL_TABLET | Freq: Every day | ORAL | 0 refills | Status: DC

## 2020-01-14 NOTE — Patient Instructions (Signed)
Pick up Fluconazole from pharmacy today.

## 2020-01-14 NOTE — Progress Notes (Signed)
Cystoscopy Procedure Note:  Indication: Stent removal s/p 01/08/20 right ureteroscopy, laser lithotripsy, stent placement for 8 mm renal pelvis stone- had simultaneous cystolitholapaxy and HOLEP  Bactrim given prior to stent removal.  After informed consent and discussion of the procedure and its risks, NICOLAOS MITRANO was positioned and prepped in the standard fashion. Cystoscopy was performed with a flexible cystoscope. The stent was grasped with flexible graspers and removed in its entirety. The patient tolerated the procedure well.  Findings: Uncomplicated stent removal  Assessment and Plan: Follow up as scheduled in January for post-HOLEP visit Fluconazole for yeast in urine   Sondra Come, MD 01/14/2020

## 2020-01-15 LAB — MICROSCOPIC EXAMINATION: RBC, Urine: >30 /hpf — AB (ref 0–2)

## 2020-01-15 LAB — URINALYSIS, COMPLETE
Bilirubin, UA: NEGATIVE
Ketones, UA: NEGATIVE
Nitrite, UA: NEGATIVE
Specific Gravity, UA: >1.03 — ABNORMAL HIGH (ref 1.005–1.030)
Urobilinogen, Ur: 1 mg/dL (ref 0.2–1.0)
pH, UA: 5.5 (ref 5.0–7.5)

## 2020-02-17 DIAGNOSIS — N401 Enlarged prostate with lower urinary tract symptoms: Secondary | ICD-10-CM | POA: Insufficient documentation

## 2020-03-10 ENCOUNTER — Ambulatory Visit (INDEPENDENT_AMBULATORY_CARE_PROVIDER_SITE_OTHER): Payer: Medicare Other | Admitting: Urology

## 2020-03-10 ENCOUNTER — Other Ambulatory Visit: Payer: Self-pay

## 2020-03-10 ENCOUNTER — Encounter: Payer: Self-pay | Admitting: Urology

## 2020-03-10 VITALS — BP 173/90 | HR 65 | Ht 70.0 in | Wt 191.9 lb

## 2020-03-10 DIAGNOSIS — N3281 Overactive bladder: Secondary | ICD-10-CM | POA: Diagnosis not present

## 2020-03-10 DIAGNOSIS — R350 Frequency of micturition: Secondary | ICD-10-CM

## 2020-03-10 DIAGNOSIS — N138 Other obstructive and reflux uropathy: Secondary | ICD-10-CM | POA: Diagnosis not present

## 2020-03-10 DIAGNOSIS — N401 Enlarged prostate with lower urinary tract symptoms: Secondary | ICD-10-CM

## 2020-03-10 LAB — BLADDER SCAN AMB NON-IMAGING

## 2020-03-10 MED ORDER — MIRABEGRON ER 25 MG PO TB24: 25.0000 mg | ORAL_TABLET | Freq: Every day | ORAL | 0 refills | Status: DC

## 2020-03-10 NOTE — Patient Instructions (Signed)

## 2020-03-10 NOTE — Progress Notes (Signed)
   03/10/2020 9:58 AM   [name] 12-08-1940 [id number]  Reason for visit: Follow up HOLEP/cystolitholapaxy/right ureteroscopy and laser lithotripsy  HPI: I saw Mr. Mclees and his wife back in urology clinic for follow-up after undergoing HOLEP/cystolitholapaxy/right ureteroscopy and laser lithotripsy for a 1 cm renal stone.  His past medical history is notable for CAD, Parkinson's, and stroke.  Prior surgery, he was having very bothersome urinary symptoms with urgency, weak stream, straining, and recurrent UTIs.  He is doing well since surgery and urinating with a strong stream.  He still having some moderate urgency and occasional urge incontinence.  He is drinking primarily water during the day, but some sodas.  IPSS score has improved to 8, with quality of life mostly satisfied from 28 preop.  He denies any flank pain or dysuria.  PVR is normal today at 31 mL.  Reassurance provided at length regarding postop expectations after HOLEP.  With his Parkinson's and history of stroke, he may also have a component of overactive bladder.  I recommended a 1 month trial of Myrbetriq as he is healing from HOLEP, and samples were given.  I do not anticipate he will need to be on this long-term, but we could consider in the future if he has persistently bothersome overactive symptoms 5 to 6 months after HOLEP.  RTC 3 months symptom check 1 month trial of Myrbetriq, samples given  \ Sondra Come, MD  [facility] [address], [address] [place], [place] [address] [phone]

## 2020-06-08 ENCOUNTER — Other Ambulatory Visit: Payer: Self-pay

## 2020-06-08 ENCOUNTER — Encounter: Payer: Self-pay | Admitting: Urology

## 2020-06-08 ENCOUNTER — Ambulatory Visit (INDEPENDENT_AMBULATORY_CARE_PROVIDER_SITE_OTHER): Payer: Medicare Other | Admitting: Urology

## 2020-06-08 VITALS — BP 140/91 | HR 62 | Ht 71.0 in | Wt 195.0 lb

## 2020-06-08 DIAGNOSIS — N138 Other obstructive and reflux uropathy: Secondary | ICD-10-CM | POA: Diagnosis not present

## 2020-06-08 DIAGNOSIS — N200 Calculus of kidney: Secondary | ICD-10-CM

## 2020-06-08 DIAGNOSIS — N401 Enlarged prostate with lower urinary tract symptoms: Secondary | ICD-10-CM

## 2020-06-08 LAB — BLADDER SCAN AMB NON-IMAGING

## 2020-06-08 NOTE — Patient Instructions (Signed)
Dietary Guidelines to Help Prevent Kidney Stones Kidney stones are deposits of minerals and salts that form inside your kidneys. Your risk of developing kidney stones may be greater depending on your diet, your lifestyle, the medicines you take, and whether you have certain medical conditions. Most people can lower their chances of developing kidney stones by following the instructions below. Your dietitian may give you more specific instructions depending on your overall health and the type of kidney stones you tend to develop. What are tips for following this plan? Reading food labels  Choose foods with "no salt added" or "low-salt" labels. Limit your salt (sodium) intake to less than 1,500 mg a day.  Choose foods with calcium for each meal and snack. Try to eat about 300 mg of calcium at each meal. Foods that contain 200-500 mg of calcium a serving include: ? 8 oz (237 mL) of milk, calcium-fortifiednon-dairy milk, and calcium-fortifiedfruit juice. Calcium-fortified means that calcium has been added to these drinks. ? 8 oz (237 mL) of kefir, yogurt, and soy yogurt. ? 4 oz (114 g) of tofu. ? 1 oz (28 g) of cheese. ? 1 cup (150 g) of dried figs. ? 1 cup (91 g) of cooked broccoli. ? One 3 oz (85 g) can of sardines or mackerel. Most people need 1,000-1,500 mg of calcium a day. Talk to your dietitian about how much calcium is recommended for you.   Shopping  Buy plenty of fresh fruits and vegetables. Most people do not need to avoid fruits and vegetables, even if these foods contain nutrients that may contribute to kidney stones.  When shopping for convenience foods, choose: ? Whole pieces of fruit. ? Pre-made salads with dressing on the side. ? Low-fat fruit and yogurt smoothies.  Avoid buying frozen meals or prepared deli foods. These can be high in sodium.  Look for foods with live cultures, such as yogurt and kefir.  Choose high-fiber grains, such as whole-wheat breads, oat bran, and  wheat cereals. Cooking  Do not add salt to food when cooking. Place a salt shaker on the table and allow each person to add his or her own salt to taste.  Use vegetable protein, such as beans, textured vegetable protein (TVP), or tofu, instead of meat in pasta, casseroles, and soups. Meal planning  Eat less salt, if told by your dietitian. To do this: ? Avoid eating processed or pre-made food. ? Avoid eating fast food.  Eat less animal protein, including cheese, meat, poultry, or fish, if told by your dietitian. To do this: ? Limit the number of times you have meat, poultry, fish, or cheese each week. Eat a diet free of meat at least 2 days a week. ? Eat only one serving each day of meat, poultry, fish, or seafood. ? When you prepare animal protein, cut pieces into small portion sizes. For most meat and fish, one serving is about the size of the palm of your hand.  Eat at least five servings of fresh fruits and vegetables each day. To do this: ? Keep fruits and vegetables on hand for snacks. ? Eat one piece of fruit or a handful of berries with breakfast. ? Have a salad and fruit at lunch. ? Have two kinds of vegetables at dinner.  Limit foods that are high in a substance called oxalate. These include: ? Spinach (cooked), rhubarb, beets, sweet potatoes, and Swiss chard. ? Peanuts. ? Potato chips, french fries, and baked potatoes with skin on. ? Nuts and   nut products. ? Chocolate.  If you regularly take a diuretic medicine, make sure to eat at least 1 or 2 servings of fruits or vegetables that are high in potassium each day. These include: ? Avocado. ? Banana. ? Orange, prune, carrot, or tomato juice. ? Baked potato. ? Cabbage. ? Beans and split peas. Lifestyle  Drink enough fluid to keep your urine pale yellow. This is the most important thing you can do. Spread your fluid intake throughout the day.  If you drink alcohol: ? Limit how much you use to:  0-1 drink a day for  women who are not pregnant.  0-2 drinks a day for men. ? Be aware of how much alcohol is in your drink. In the [place] one drink equals one 12 oz bottle of beer (355 mL), one 5 oz glass of wine (148 mL), or one 1 oz glass of hard liquor (44 mL).  Lose weight if told by your health care provider. Work with your dietitian to find an eating plan and weight loss strategies that work best for you.   General information  Talk to your health care provider and dietitian about taking daily supplements. You may be told the following depending on your health and the cause of your kidney stones: ? Not to take supplements with vitamin C. ? To take a calcium supplement. ? To take a daily probiotic supplement. ? To take other supplements such as magnesium, fish oil, or vitamin B6.  Take over-the-counter and prescription medicines only as told by your health care provider. These include supplements. What foods should I limit? Limit your intake of the following foods, or eat them as told by your dietitian. Vegetables Spinach. Rhubarb. Beets. Canned vegetables. Pickles. Olives. Baked potatoes with skin. Grains Wheat bran. Baked goods. Salted crackers. Cereals high in sugar. Meats and other proteins Nuts. Nut butters. Large portions of meat, poultry, or fish. Salted, precooked, or cured meats, such as sausages, meat loaves, and hot dogs. Dairy Cheese. Beverages Regular soft drinks. Regular vegetable juice. Seasonings and condiments Seasoning blends with salt. Salad dressings. Soy sauce. Ketchup. Barbecue sauce. Other foods Canned soups. Canned pasta sauce. Casseroles. Pizza. Lasagna. Frozen meals. Potato chips. French fries. The items listed above may not be a complete list of foods and beverages you should limit. Contact a dietitian for more information. What foods should I avoid? Talk to your dietitian about specific foods you should avoid based on the type of kidney stones you have and your overall  health. Fruits Grapefruit. The item listed above may not be a complete list of foods and beverages you should avoid. Contact a dietitian for more information. Summary  Kidney stones are deposits of minerals and salts that form inside your kidneys.  You can lower your risk of kidney stones by making changes to your diet.  The most important thing you can do is drink enough fluid. Drink enough fluid to keep your urine pale yellow.  Talk to your dietitian about how much calcium you should have each day, and eat less salt and animal protein as told by your dietitian. This information is not intended to replace advice given to you by your health care provider. Make sure you discuss any questions you have with your health care provider. Document Revised: 02/05/2019 Document Reviewed: 02/05/2019 Elsevier Patient Education  2021 Elsevier Inc.  

## 2020-06-08 NOTE — Progress Notes (Signed)
   06/08/2020 12:37 PM   Jeffery Strong [birth year] [id number]  Reason for visit: Follow up BPH status post HOLEP and nephrolithiasis  HPI: 80 year old male who presented with BPH, recurrent UTIs, and a right-sided renal pelvis stone and underwent an uncomplicated HOLEP, right ureteroscopy/laser lithotripsy, and cystolitholapaxy on 01/08/2020.  Pathology showed only benign prostate tissue, and his stent was subsequently removed.  He has been doing extremely well since that time.  He denies any complaints today.  He specifically denies any urgency, frequency, weak stream, or incontinence.  PVR is normal at 0 mL.  IPSS score today is 0 with quality of life delighted.  We discussed general stone prevention strategies including adequate hydration with goal of producing 2.5 L of urine daily, increasing citric acid intake, increasing calcium intake during high oxalate meals, minimizing animal protein, and decreasing salt intake. Information about dietary recommendations given today.   RTC 1 year for KUB for stone surveillance   Sondra Come, MD  [facility] [address], [address] [place], [place] [address] [phone]

## 2021-02-22 ENCOUNTER — Encounter: Payer: Self-pay | Admitting: Urology

## 2021-06-07 ENCOUNTER — Ambulatory Visit: Payer: Medicare Other | Admitting: Urology

## 2021-06-07 ENCOUNTER — Encounter: Payer: Self-pay | Admitting: Urology

## 2021-06-07 ENCOUNTER — Ambulatory Visit
Admission: RE | Admit: 2021-06-07 | Discharge: 2021-06-07 | Disposition: A | Discharge status: Home / Self Care | Payer: Medicare Other | Attending: Urology | Admitting: Urology

## 2021-06-07 ENCOUNTER — Ambulatory Visit
Admission: RE | Admit: 2021-06-07 | Discharge: 2021-06-07 | Disposition: A | Discharge status: Home / Self Care | Payer: Medicare Other | Source: Ambulatory Visit | Attending: Urology | Admitting: Urology

## 2021-06-07 VITALS — BP 139/76 | HR 59 | Ht 70.0 in | Wt 190.0 lb

## 2021-06-07 DIAGNOSIS — R5383 Other fatigue: Secondary | ICD-10-CM | POA: Diagnosis not present

## 2021-06-07 DIAGNOSIS — N401 Enlarged prostate with lower urinary tract symptoms: Secondary | ICD-10-CM

## 2021-06-07 DIAGNOSIS — N138 Other obstructive and reflux uropathy: Secondary | ICD-10-CM

## 2021-06-07 DIAGNOSIS — N200 Calculus of kidney: Secondary | ICD-10-CM | POA: Diagnosis present

## 2021-06-07 DIAGNOSIS — N529 Male erectile dysfunction, unspecified: Secondary | ICD-10-CM

## 2021-06-07 MED ORDER — TADALAFIL 10 MG PO TABS: 10.0000 mg | ORAL_TABLET | Freq: Every day | ORAL | 6 refills | Status: AC | PRN

## 2021-06-07 NOTE — Patient Instructions (Signed)
Tadalafil Tablets (Erectile Dysfunction, BPH) ?What is this medication? ?TADALAFIL (tah DA la fil) treats erectile dysfunction (ED). It works by increasing blood flow to the penis, which helps to maintain an erection. It may also be used to treat symptoms of an enlarged prostate (benign prostatic hyperplasia). ?This medicine may be used for other purposes; ask your health care provider or pharmacist if you have questions. ?COMMON BRAND NAME(S): Adcirca, ALYQ, Cialis ?What should I tell my care team before I take this medication? ?They need to know if you have any of these conditions: ?Anatomical deformation of the penis, Peyronie's disease, or history of priapism (painful and prolonged erection) ?Bleeding disorders ?Eye or vision problems, including a rare inherited eye disease called retinitis pigmentosa ?Heart disease, angina, a history of heart attack, irregular heart beats, or other heart problems ?High or low blood pressure ?History of blood diseases, like sickle cell anemia or leukemia ?History of stomach bleeding ?Kidney disease ?Liver disease ?Stroke ?An unusual or allergic reaction to tadalafil, other medications, foods, dyes, or preservatives ?Pregnant or trying to get pregnant ?Breast-feeding ?How should I use this medication? ?Take this medication by mouth with a glass of water. Follow the directions on the prescription label. You may take this medication with or without meals. When this medication is used for erection problems, your care team may prescribe it to be taken once daily or as needed. If you are taking the medication as needed, you may be able to have sexual activity 30 minutes after taking it and for up to 36 hours after taking it. Whether you are taking the medication as needed or once daily, you should not take more than one dose per day. If you are taking this medication for symptoms of benign prostatic hyperplasia (BPH) or to treat both BPH and an erection problem, take the dose once  daily at about the same time each day. Do not take your medication more often than directed. ?Talk to your care team about the use of this medication in children. Special care may be needed. ?Overdosage: If you think you have taken too much of this medicine contact a poison control center or emergency room at once. ?NOTE: This medicine is only for you. Do not share this medicine with others. ?What if I miss a dose? ?If you are taking this medication as needed for erection problems, this does not apply. If you miss a dose while taking this medication once daily for an erection problem, benign prostatic hyperplasia, or both, take it as soon as you remember, but do not take more than one dose per day. ?What may interact with this medication? ?Do not take this medication with any of the following: ?Nitrates like amyl nitrite, isosorbide dinitrate, isosorbide mononitrate, nitroglycerin ?Other medications for erectile dysfunction like avanafil, sildenafil, vardenafil ?Other tadalafil products (Adcirca) ?Riociguat ?This medication may also interact with the following: ?Certain medications for high blood pressure ?Certain medications for the treatment of HIV infection or AIDS ?Certain medications used for fungal or yeast infections, like fluconazole, itraconazole, ketoconazole, and voriconazole ?Certain medications used for seizures like carbamazepine, phenytoin, and phenobarbital ?Grapefruit juice ?Macrolide antibiotics like clarithromycin, erythromycin, troleandomycin ?Medications for prostate problems ?Rifabutin, rifampin or rifapentine ?This list may not describe all possible interactions. Give your health care provider a list of all the medicines, herbs, non-prescription drugs, or dietary supplements you use. Also tell them if you smoke, drink alcohol, or use illegal drugs. Some items may interact with your medicine. ?What should I watch for   while using this medication? ?If you notice any changes in your vision while  taking this medication, call your care team as soon as possible. Stop using this medication and call your care team right away if you have a loss of sight in one or both eyes. ?Contact your care team right away if the erection lasts longer than 4 hours or if it becomes painful. This may be a sign of serious problem and must be treated right away to prevent permanent damage. ?If you experience symptoms of nausea, dizziness, chest pain or arm pain upon initiation of sexual activity after taking this medication, you should refrain from further activity and call your care team as soon as possible. ?Do not drink alcohol to excess (examples, 5 glasses of wine or 5 shots of whiskey) when taking this medication. When taken in excess, alcohol can increase your chances of getting a headache or getting dizzy, increasing your heart rate or lowering your blood pressure. ?Using this medication does not protect you or your partner against HIV infection (the virus that causes AIDS) or other sexually transmitted diseases. ?What side effects may I notice from receiving this medication? ?Side effects that you should report to your care team as soon as possible: ?Allergic reactions--skin rash, itching, hives, swelling of the face, lips, tongue, or throat ?Hearing loss or ringing in ears ?Heart attack--pain or tightness in the chest, shoulders, arms, or jaw, nausea, shortness of breath, cold or clammy skin, feeling faint or lightheaded ?Low blood pressure--dizziness, feeling faint or lightheaded, blurry vision ?Prolonged or painful erection ?Redness, blistering, peeling, or loosening of the skin, including inside the mouth ?Stroke--sudden numbness or weakness of the face, arm, or leg, trouble speaking, confusion, trouble walking, loss of balance or coordination, dizziness, severe headache, change in vision ?Sudden vision loss in one or both eyes ?Side effects that usually do not require medical attention (report to your care team if  they continue or are bothersome): ?Back pain ?Facial flushing or redness ?Headache ?Muscle pain ?Runny or stuffy nose ?Upset stomach ?This list may not describe all possible side effects. Call your doctor for medical advice about side effects. You may report side effects to FDA at 1-800-FDA-1088. ?Where should I keep my medication? ?Keep out of the reach of children. ?Store at room temperature between 15 and 30 degrees C (59 and 86 degrees F). Throw away any unused medication after the expiration date. ?NOTE: This sheet is a summary. It may not cover all possible information. If you have questions about this medicine, talk to your doctor, pharmacist, or health care provider. ?? 2022 Elsevier/Gold Standard (2020-04-28 00:00:00) ? ?

## 2021-06-07 NOTE — Progress Notes (Signed)
? ?  06/07/2021 ?9:11 AM  ? ?Jeffery Strong ?10-Jun-1940 ?[id number] ? ?Reason for visit: Follow up BPH status post HOLEP, nephrolithiasis, ED ? ?HPI: ?81 year old male who presented with BPH, recurrent UTIs, and a right-sided renal pelvis stone and underwent an uncomplicated HOLEP, right ureteroscopy/laser lithotripsy, and cystolitholapaxy on 01/08/2020.  Pathology showed only benign prostate tissue, and his stent was subsequently removed. ? ?He has been doing extremely well since that time.  He denies any complaints today.  He specifically denies any urgency, frequency, weak stream, or incontinence.  PVRs have been normal and he denies any urinary complaints. ? ?I personally viewed and interpreted his KUB today that shows no obvious evidence of kidney stones. We discussed general stone prevention strategies including adequate hydration with goal of producing 2.5 L of urine daily, increasing citric acid intake, increasing calcium intake during high oxalate meals, minimizing animal protein, and decreasing salt intake. Information about dietary recommendations given today.  ? ?Finally, he had some concerns about erectile dysfunction over the last few years.  It sounds like he tried Viagra a few years ago without significant improvement, dose unclear.  He is interested in trying other medications.  We discussed the AUA guidelines that recommend checking a testosterone level, and I recommended a trial of Cialis 10 mg daily.  We discussed that this could be taken daily or as needed, and the maximum dose is 20 mg. ? ?Cialis 10 mg daily for ED ?RTC with PA 4 weeks symptom check, discuss testosterone levels and alternative ED options if no improvement on Cialis ? ?Sondra Come, MD ? ?[facility] ?[address], [address] ?[place], [place] [address] ?([phone] ? ? ?

## 2021-06-08 ENCOUNTER — Ambulatory Visit: Payer: Self-pay | Admitting: Urology

## 2021-06-08 LAB — TESTOSTERONE: Testosterone: 930 ng/dL — ABNORMAL HIGH (ref 264–916)

## 2021-06-09 ENCOUNTER — Telehealth: Payer: Self-pay

## 2021-06-09 NOTE — Telephone Encounter (Signed)
-----   Message from Sondra Come, MD sent at 06/08/2021  7:27 AM EDT ----- ?Good news, testosterone normal at 930, keep follow up with PA to discuss ED ? ?Legrand Rams, MD ?06/08/2021 ? ? ?

## 2021-06-09 NOTE — Telephone Encounter (Signed)
Called pt to informed him of the information below. Pt voiced understanding. Pt have several questions about the possible side effects of Cialis, all questions answered.  ?

## 2021-07-17 NOTE — Progress Notes (Unsigned)
07/18/2021 8:31 PM   [name] Jun 29, 1940 [id number]  Referring provider: [name], MD [phone] [facility] [address] [facility] [place],  [place] [address]  Urological history: 1.  High risk hematuria -Former smoker -CTU, 11/2019 - 90 g prostate, 1 cm bladder stone, 1 cm right renal pelvis stone -cysto, 11/2019 - large prostate with obstructing lateral lobes, 1 cm bladder stone -no reports of gross heme -UA, 07/2020 -negative for micro heme  2.  Nephrolithiasis - right URS, 2021  3. Bladder stone - cystolitholapaxy, 2021  4. BPH with LU TS -PSA, 2022 - 0.61 -HoLEP, 2021 - prostate chips benign   5. ED -Contributing factors of age, BPH, HTN, CAD, stroke, HLD, depression, anxiety, former smoker and Parkison's -testosterone level, 05/2021 - 930 -SHIM ***   No chief complaint on file.   HPI: Jeffery Strong is a 81 y.o. male who presents today after a 6 weeks trial of tadalafil 10 mg daily.    Score: 1-7 Severe ED 8-11 Moderate ED 12-16 Mild-Moderate ED 17-21 Mild ED 22-25 No ED     PMH: Past Medical History:  Diagnosis Date   Anemia    B12 deficiency   BPH associated with nocturia    CAD (coronary artery disease)    GERD (gastroesophageal reflux disease)    occasionally.  not bad anymore. does not take meds   History of hiatal hernia    History of kidney stones 12/2019   history of stones and current stones   HLD (hyperlipidemia)    Hypertension    Parkinson disease (HCC)    right side per pt report   Stroke (HCC) 06/2018   slight balance issues    Surgical History: Past Surgical History:  Procedure Laterality Date   BACK SURGERY  2002   neck region, fusion. no metal   CARDIAC CATHETERIZATION     Cardiac Stents  2000   d/t chest pain and pressure   CHOLECYSTECTOMY     COLONOSCOPY WITH PROPOFOL N/A 12/23/2015   Procedure: COLONOSCOPY WITH PROPOFOL;  Surgeon: Scot Jun, MD;  Location: [facility] ENDOSCOPY;  Service:  Endoscopy;  Laterality: N/A;   CYSTOSCOPY WITH LITHOLAPAXY N/A 01/08/2020   Procedure: CYSTOSCOPY WITH LITHOLAPAXY;  Surgeon: Sondra Come, MD;  Location: [facility] ORS;  Service: Urology;  Laterality: N/A;   CYSTOSCOPY/URETEROSCOPY/HOLMIUM LASER/STENT PLACEMENT Right 01/08/2020   Procedure: CYSTOSCOPY/URETEROSCOPY/HOLMIUM LASER/STENT PLACEMENT;  Surgeon: Sondra Come, MD;  Location: [facility] ORS;  Service: Urology;  Laterality: Right;   ESOPHAGOGASTRODUODENOSCOPY (EGD) WITH PROPOFOL N/A 12/23/2015   Procedure: ESOPHAGOGASTRODUODENOSCOPY (EGD) WITH PROPOFOL;  Surgeon: Scot Jun, MD;  Location: [facility] ENDOSCOPY;  Service: Endoscopy;  Laterality: N/A;   EYE SURGERY Bilateral    cataracts   HOLEP-LASER ENUCLEATION OF THE PROSTATE WITH MORCELLATION N/A 01/08/2020   Procedure: HOLEP-LASER ENUCLEATION OF THE PROSTATE WITH MORCELLATION;  Surgeon: Sondra Come, MD;  Location: [facility] ORS;  Service: Urology;  Laterality: N/A;    Home Medications:  Allergies as of 07/18/2021   No Known Allergies      Medication List        Accurate as of Jul 17, 2021  8:31 PM. If you have any questions, ask your nurse or doctor.          aspirin EC 325 MG tablet Take 325 mg by mouth daily.   atorvastatin 20 MG tablet Commonly known as: LIPITOR Take 20 mg by mouth daily at 6 PM.   metoprolol succinate 25 MG  24 hr tablet Commonly known as: TOPROL-XL Take 25 mg by mouth daily.   mirabegron ER 25 MG Tb24 tablet Commonly known as: MYRBETRIQ Take 1 tablet (25 mg total) by mouth daily.   selegiline 5 MG tablet Commonly known as: ELDEPRYL Take 5 mg by mouth 2 (two) times daily.   tadalafil 10 MG tablet Commonly known as: Cialis Take 1 tablet (10 mg total) by mouth daily as needed for erectile dysfunction.   traZODone 50 MG tablet Commonly known as: DESYREL Take 50 mg by mouth at bedtime.        Allergies: No Known Allergies  Family History: Family History  Problem Relation Age of  Onset   Cancer Mother    Stroke Mother    Stroke Father    Parkinsonism Father    Cancer Brother    Alzheimer's disease Sister     Social History:  reports that he quit smoking about 33 years ago. His smoking use included cigarettes. He has been exposed to tobacco smoke. He has never used smokeless tobacco. He reports that he does not drink alcohol and does not use drugs.  ROS: Pertinent ROS in HPI  Physical Exam: There were no vitals taken for this visit.  Constitutional:  Well nourished. Alert and oriented, No acute distress. HEENT: [place] AT, moist mucus membranes.  Trachea midline, no masses. Cardiovascular: No clubbing, cyanosis, or edema. Respiratory: Normal respiratory effort, no increased work of breathing. GI: Abdomen is soft, non tender, non distended, no abdominal masses. Liver and spleen not palpable.  No hernias appreciated.  Stool sample for occult testing is not indicated.   GU: No CVA tenderness.  No bladder fullness or masses.  Patient with circumcised/uncircumcised phallus. ***Foreskin easily retracted***  Urethral meatus is patent.  No penile discharge. No penile lesions or rashes. Scrotum without lesions, cysts, rashes and/or edema.  Testicles are located scrotally bilaterally. No masses are appreciated in the testicles. Left and right epididymis are normal. Rectal: Patient with  normal sphincter tone. Anus and perineum without scarring or rashes. No rectal masses are appreciated. Prostate is approximately *** grams, *** nodules are appreciated. Seminal vesicles are normal. Skin: No rashes, bruises or suspicious lesions. Lymph: No cervical or inguinal adenopathy. Neurologic: Grossly intact, no focal deficits, moving all 4 extremities. Psychiatric: Normal mood and affect.  Laboratory Data: Lab Results  Component Value Date   TESTOSTERONE 930 (H) 06/07/2021  I have reviewed the labs.   Pertinent Imaging: N/A    Assessment & Plan:    1. ED ***  No follow-ups on  file.  These notes generated with voice recognition software. I apologize for typographical errors.  Michiel Cowboy, PA-C  [facility] [address]  [address] [place], [place] [address] [phone]

## 2021-07-18 ENCOUNTER — Ambulatory Visit: Payer: 59 | Admitting: Urology

## 2021-07-18 DIAGNOSIS — N529 Male erectile dysfunction, unspecified: Secondary | ICD-10-CM

## 2021-07-25 NOTE — Progress Notes (Unsigned)
07/26/2021 3:45 PM   [name] 1940/12/20 [id number]  Referring provider: [name], MD [phone] [facility] [address] [facility] [place],  [place] [address]  Urological history: 1.  High risk hematuria -Former smoker -CTU, 11/2019 - 90 g prostate, 1 cm bladder stone, 1 cm right renal pelvis stone -cysto, 11/2019 - large prostate with obstructing lateral lobes, 1 cm bladder stone -no reports of gross heme -UA, 07/2020 -negative for micro heme  2.  Nephrolithiasis - right URS, 2021  3. Bladder stone - cystolitholapaxy, 2021  4. BPH with LU TS -PSA, 2022 - 0.61 -HoLEP, 2021 - prostate chips benign   5. ED -Contributing factors of age, BPH, HTN, CAD, stroke, HLD, depression, anxiety, former smoker and Parkison's -testosterone level, 05/2021 - 930 -SHIM ***   No chief complaint on file.   HPI: Jeffery Strong is a 81 y.o. male who presents today after a 6 weeks trial of tadalafil 10 mg daily.    Score: 1-7 Severe ED 8-11 Moderate ED 12-16 Mild-Moderate ED 17-21 Mild ED 22-25 No ED     PMH: Past Medical History:  Diagnosis Date   Anemia    B12 deficiency   BPH associated with nocturia    CAD (coronary artery disease)    GERD (gastroesophageal reflux disease)    occasionally.  not bad anymore. does not take meds   History of hiatal hernia    History of kidney stones 12/2019   history of stones and current stones   HLD (hyperlipidemia)    Hypertension    Parkinson disease (HCC)    right side per pt report   Stroke (HCC) 06/2018   slight balance issues    Surgical History: Past Surgical History:  Procedure Laterality Date   BACK SURGERY  2002   neck region, fusion. no metal   CARDIAC CATHETERIZATION     Cardiac Stents  2000   d/t chest pain and pressure   CHOLECYSTECTOMY     COLONOSCOPY WITH PROPOFOL N/A 12/23/2015   Procedure: COLONOSCOPY WITH PROPOFOL;  Surgeon: Scot Jun, MD;  Location: [facility] ENDOSCOPY;  Service:  Endoscopy;  Laterality: N/A;   CYSTOSCOPY WITH LITHOLAPAXY N/A 01/08/2020   Procedure: CYSTOSCOPY WITH LITHOLAPAXY;  Surgeon: Sondra Come, MD;  Location: [facility] ORS;  Service: Urology;  Laterality: N/A;   CYSTOSCOPY/URETEROSCOPY/HOLMIUM LASER/STENT PLACEMENT Right 01/08/2020   Procedure: CYSTOSCOPY/URETEROSCOPY/HOLMIUM LASER/STENT PLACEMENT;  Surgeon: Sondra Come, MD;  Location: [facility] ORS;  Service: Urology;  Laterality: Right;   ESOPHAGOGASTRODUODENOSCOPY (EGD) WITH PROPOFOL N/A 12/23/2015   Procedure: ESOPHAGOGASTRODUODENOSCOPY (EGD) WITH PROPOFOL;  Surgeon: Scot Jun, MD;  Location: [facility] ENDOSCOPY;  Service: Endoscopy;  Laterality: N/A;   EYE SURGERY Bilateral    cataracts   HOLEP-LASER ENUCLEATION OF THE PROSTATE WITH MORCELLATION N/A 01/08/2020   Procedure: HOLEP-LASER ENUCLEATION OF THE PROSTATE WITH MORCELLATION;  Surgeon: Sondra Come, MD;  Location: [facility] ORS;  Service: Urology;  Laterality: N/A;    Home Medications:  Allergies as of 07/26/2021   No Known Allergies      Medication List        Accurate as of Jul 25, 2021  3:45 PM. If you have any questions, ask your nurse or doctor.          aspirin EC 325 MG tablet Take 325 mg by mouth daily.   atorvastatin 20 MG tablet Commonly known as: LIPITOR Take 20 mg by mouth daily at 6 PM.   metoprolol succinate 25 MG  24 hr tablet Commonly known as: TOPROL-XL Take 25 mg by mouth daily.   mirabegron ER 25 MG Tb24 tablet Commonly known as: MYRBETRIQ Take 1 tablet (25 mg total) by mouth daily.   selegiline 5 MG tablet Commonly known as: ELDEPRYL Take 5 mg by mouth 2 (two) times daily.   tadalafil 10 MG tablet Commonly known as: Cialis Take 1 tablet (10 mg total) by mouth daily as needed for erectile dysfunction.   traZODone 50 MG tablet Commonly known as: DESYREL Take 50 mg by mouth at bedtime.        Allergies: No Known Allergies  Family History: Family History  Problem Relation Age of  Onset   Cancer Mother    Stroke Mother    Stroke Father    Parkinsonism Father    Cancer Brother    Alzheimer's disease Sister     Social History:  reports that he quit smoking about 33 years ago. His smoking use included cigarettes. He has been exposed to tobacco smoke. He has never used smokeless tobacco. He reports that he does not drink alcohol and does not use drugs.  ROS: Pertinent ROS in HPI  Physical Exam: There were no vitals taken for this visit.  Constitutional:  Well nourished. Alert and oriented, No acute distress. HEENT: [place] AT, moist mucus membranes.  Trachea midline, no masses. Cardiovascular: No clubbing, cyanosis, or edema. Respiratory: Normal respiratory effort, no increased work of breathing. GI: Abdomen is soft, non tender, non distended, no abdominal masses. Liver and spleen not palpable.  No hernias appreciated.  Stool sample for occult testing is not indicated.   GU: No CVA tenderness.  No bladder fullness or masses.  Patient with circumcised/uncircumcised phallus. ***Foreskin easily retracted***  Urethral meatus is patent.  No penile discharge. No penile lesions or rashes. Scrotum without lesions, cysts, rashes and/or edema.  Testicles are located scrotally bilaterally. No masses are appreciated in the testicles. Left and right epididymis are normal. Rectal: Patient with  normal sphincter tone. Anus and perineum without scarring or rashes. No rectal masses are appreciated. Prostate is approximately *** grams, *** nodules are appreciated. Seminal vesicles are normal. Skin: No rashes, bruises or suspicious lesions. Lymph: No cervical or inguinal adenopathy. Neurologic: Grossly intact, no focal deficits, moving all 4 extremities. Psychiatric: Normal mood and affect.  Laboratory Data: Lab Results  Component Value Date   TESTOSTERONE 930 (H) 06/07/2021  I have reviewed the labs.   Pertinent Imaging: N/A    Assessment & Plan:    1. ED ***  No follow-ups on  file.  These notes generated with voice recognition software. I apologize for typographical errors.  Michiel Cowboy, PA-C  [facility] [address]  [address] [place], [place] [address] [phone]

## 2021-07-26 ENCOUNTER — Ambulatory Visit (INDEPENDENT_AMBULATORY_CARE_PROVIDER_SITE_OTHER): Payer: Medicare Other | Admitting: Urology

## 2021-07-26 ENCOUNTER — Encounter: Payer: Self-pay | Admitting: Urology

## 2021-07-26 VITALS — BP 182/87 | HR 64 | Ht 70.0 in | Wt 198.0 lb

## 2021-07-26 DIAGNOSIS — N529 Male erectile dysfunction, unspecified: Secondary | ICD-10-CM | POA: Diagnosis not present

## 2021-07-26 DIAGNOSIS — R319 Hematuria, unspecified: Secondary | ICD-10-CM

## 2021-07-27 LAB — MICROSCOPIC EXAMINATION: Bacteria, UA: NONE SEEN

## 2021-07-27 LAB — URINALYSIS, COMPLETE
Bilirubin, UA: NEGATIVE
Glucose, UA: NEGATIVE
Ketones, UA: NEGATIVE
Leukocytes,UA: NEGATIVE
Nitrite, UA: NEGATIVE
RBC, UA: NEGATIVE
Specific Gravity, UA: 1.03 (ref 1.005–1.030)
Urobilinogen, Ur: 0.2 mg/dL (ref 0.2–1.0)
pH, UA: 5.5 (ref 5.0–7.5)

## 2021-07-27 LAB — TESTOSTERONE: Testosterone: 741 ng/dL (ref 264–916)

## 2021-09-13 ENCOUNTER — Ambulatory Visit: Payer: 59 | Admitting: Urology

## 2021-11-30 ENCOUNTER — Telehealth: Payer: Self-pay | Admitting: Urology

## 2021-11-30 NOTE — Telephone Encounter (Signed)
Would you call Jeffery Strong and get him rescheduled for his missed appointment in July?  His testosterone level was high and we need to run more blood tests to make sure he does not have a tumor that is causing his testosterone to be elevated.

## 2021-11-30 NOTE — Telephone Encounter (Signed)
Pt scheduled for 12/07/21, pt confirmed.

## 2021-12-06 NOTE — Progress Notes (Signed)
12/07/2021 9:18 AM   Jeffery Strong 10-15-1940 [id number]  Referring provider: Rusty Aus, MD [facility] [place],  [place] [address]  Urological history: 1.  High risk hematuria -Former smoker -CTU, 11/2019 - 90 g prostate, 1 cm bladder stone, 1 cm right renal pelvis stone -cysto, 11/2019 - large prostate with obstructing lateral lobes, 1 cm bladder stone -no reports of gross heme -UA (07/2021) -negative for micro heme  2.  Nephrolithiasis - right URS, 2021  3. Bladder stone - cystolitholapaxy, 2021  4. BPH with LU TS -PSA (07/2021) 0.61 @ PCP's office  -HoLEP, 2021 - prostate chips benign   5. ED -Contributing factors of age, BPH, HTN, CAD, stroke, HLD, depression, anxiety, former smoker and Parkison's -testosterone level (06/2021) 741 -SHIM 31   Chief Complaint  Patient presents with   Erectile Dysfunction   Hematuria   Follow-up    HPI: Jeffery Strong is a 81 y.o. male who presents today after an overdue appointment for a 6 weeks trial of tadalafil 10 mg daily.  His testosterone was also was found to be 741 at his last visit and at 930 at the previous visit.  He denied any testosterone supplementation.    He is not sure what his medications are at this time.  I spoke with his wife Jeffery Strong and she stated he is only taking his nighttime medicine which is trazodone 50 mg tablet and atorvastatin which is a 20 mg tablet.  He is not taking the metoprolol 25 mg daily, the aspirin or the selegiline.    Patient still having spontaneous erections.  He denies any pain or curvature with erections.  He is not sure if he is taking the tadalafil.     SHIM     Row Name 12/07/21 0854         SHIM: Over the last 6 months:   How do you rate your confidence that you could get and keep an erection? Moderate     When you had erections with sexual stimulation, how often were your erections hard enough for penetration (entering  your partner)? Sometimes (about half the time)     During sexual intercourse, how often were you able to maintain your erection after you had penetrated (entered) your partner? Sometimes (about half the time)     During sexual intercourse, how difficult was it to maintain your erection to completion of intercourse? Not Difficult     When you attempted sexual intercourse, how often was it satisfactory for you? Most Times (much more than half the time)       SHIM Total Score   SHIM 18               Score: 1-7 Severe ED 8-11 Moderate ED 12-16 Mild-Moderate ED 17-21 Mild ED 22-25 No ED     PMH: Past Medical History:  Diagnosis Date   Anemia    B12 deficiency   BPH associated with nocturia    CAD (coronary artery disease)    GERD (gastroesophageal reflux disease)    occasionally.  not bad anymore. does not take meds   History of hiatal hernia    History of kidney stones 12/2019   history of stones and current stones   HLD (hyperlipidemia)    Hypertension    Parkinson disease ([name])    right side per pt report   Stroke ([place]) 06/2018   slight balance issues    Surgical  History: Past Surgical History:  Procedure Laterality Date   BACK SURGERY  2002   neck region, fusion. no metal   CARDIAC CATHETERIZATION     Cardiac Stents  2000   d/t chest pain and pressure   CHOLECYSTECTOMY     COLONOSCOPY WITH PROPOFOL N/A 12/23/2015   Procedure: COLONOSCOPY WITH PROPOFOL;  Surgeon: Manya Silvas, MD;  Location: [facility] ENDOSCOPY;  Service: Endoscopy;  Laterality: N/A;   CYSTOSCOPY WITH LITHOLAPAXY N/A 01/08/2020   Procedure: CYSTOSCOPY WITH LITHOLAPAXY;  Surgeon: Billey Co, MD;  Location: [facility] ORS;  Service: Urology;  Laterality: N/A;   CYSTOSCOPY/URETEROSCOPY/HOLMIUM LASER/STENT PLACEMENT Right 01/08/2020   Procedure: CYSTOSCOPY/URETEROSCOPY/HOLMIUM LASER/STENT PLACEMENT;  Surgeon: Billey Co, MD;  Location: [facility] ORS;  Service: Urology;  Laterality: Right;    ESOPHAGOGASTRODUODENOSCOPY (EGD) WITH PROPOFOL N/A 12/23/2015   Procedure: ESOPHAGOGASTRODUODENOSCOPY (EGD) WITH PROPOFOL;  Surgeon: Manya Silvas, MD;  Location[facility] ENDOSCOPY;  Service: Endoscopy;  Laterality: N/A;   EYE SURGERY Bilateral    cataracts   HOLEP-LASER ENUCLEATION OF THE PROSTATE WITH MORCELLATION N/A 01/08/2020   Procedure: HOLEP-LASER ENUCLEATION OF THE PROSTATE WITH MORCELLATION;  Surgeon: Billey Co, MD;  Location: [facility] ORS;  Service: Urology;  Laterality: N/A;    Home Medications:  Allergies as of 12/07/2021   No Known Allergies      Medication List        Accurate as of December 07, 2021  9:18 AM. If you have any questions, ask your nurse or doctor.          STOP taking these medications    mirabegron ER 25 MG Tb24 tablet Commonly known as: MYRBETRIQ       TAKE these medications    aspirin EC 325 MG tablet Take 325 mg by mouth daily.   atorvastatin 20 MG tablet Commonly known as: LIPITOR Take 20 mg by mouth daily at 6 PM.   metoprolol succinate 25 MG 24 hr tablet Commonly known as: TOPROL-XL Take 25 mg by mouth daily.   selegiline 5 MG tablet Commonly known as: ELDEPRYL Take 5 mg by mouth 2 (two) times daily.   tadalafil 10 MG tablet Commonly known as: Cialis Take 1 tablet (10 mg total) by mouth daily as needed for erectile dysfunction.   traZODone 50 MG tablet Commonly known as: DESYREL Take 50 mg by mouth at bedtime.        Allergies: No Known Allergies  Family History: Family History  Problem Relation Age of Onset   Cancer Mother    Stroke Mother    Stroke Father    Parkinsonism Father    Cancer Brother    Alzheimer's disease Sister     Social History:  reports that he quit smoking about 33 years ago. His smoking use included cigarettes. He has been exposed to tobacco smoke. He has never used smokeless tobacco. He reports that he does not drink alcohol and does not use drugs.  ROS: Pertinent ROS in  HPI  Physical Exam: BP (!) 205/98   Pulse 66   Ht '5\' 10"'  (1.778 m)   Wt 200 lb (90.7 kg)   BMI 28.70 kg/m   Constitutional:  Well nourished. Alert and oriented, No acute distress. HEENT: [place] AT, moist mucus membranes.  Trachea midline Cardiovascular: No clubbing, cyanosis, or edema. Respiratory: Normal respiratory effort, no increased work of breathing. Neurologic: Grossly intact, no focal deficits, moving all 4 extremities. Psychiatric: Normal mood and affect.   Laboratory Data: Lab Results  Component Value  Date   TESTOSTERONE 741 07/26/2021   WBC (White Blood Cell Count) 4.1 - 10.2 10^3/uL 5.1   RBC (Red Blood Cell Count) 4.69 - 6.13 10^6/uL 5.19   Hemoglobin 14.1 - 18.1 gm/dL 15.4   Hematocrit 40.0 - 52.0 % 47.7   MCV (Mean Corpuscular Volume) 80.0 - 100.0 fl 91.9   MCH (Mean Corpuscular Hemoglobin) 27.0 - 31.2 pg 29.7   MCHC (Mean Corpuscular Hemoglobin Concentration) 32.0 - 36.0 gm/dL 32.3   Platelet Count 150 - 450 10^3/uL 162   RDW-CV (Red Cell Distribution Width) 11.6 - 14.8 % 13.5   MPV (Mean Platelet Volume) 9.4 - 12.4 fl 11.7   Neutrophils 1.50 - 7.80 10^3/uL 3.22   Lymphocytes 1.00 - 3.60 10^3/uL 1.09   Monocytes 0.00 - 1.50 10^3/uL 0.70   Eosinophils 0.00 - 0.55 10^3/uL 0.09   Basophils 0.00 - 0.09 10^3/uL 0.02   Neutrophil % 32.0 - 70.0 % 62.6   Lymphocyte % 10.0 - 50.0 % 21.2   Monocyte % 4.0 - 13.0 % 13.6 High    Eosinophil % 1.0 - 5.0 % 1.8   Basophil% 0.0 - 2.0 % 0.4   Immature Granulocyte % <=0.7 % 0.4   Immature Granulocyte Count <=0.06 10^3/L 0.02   Resulting Agency  [facility] - LAB   Specimen Collected: 08/23/21 08:04   Performed by: [facility] - LAB Last Resulted: 08/23/21 14:01  Received From: [place]  Result Received: 11/30/21 10:49   Glucose 70 - 110 mg/dL 93   Sodium 136 - 145 mmol/L 138   Potassium 3.6 - 5.1 mmol/L 4.5   Chloride 97 - 109 mmol/L 103   Carbon Dioxide (CO2) 22.0 - 32.0 mmol/L  26.7   Urea Nitrogen (BUN) 7 - 25 mg/dL 21   Creatinine 0.7 - 1.3 mg/dL 1.3   Glomerular Filtration Rate (eGFR) >60 mL/min/1.73sq m 53 Low    Calcium 8.7 - 10.3 mg/dL 9.2   AST  8 - 39 U/L 19   ALT  6 - 57 U/L 15   Alk Phos (alkaline Phosphatase) 34 - 104 U/L 84   Albumin 3.5 - 4.8 g/dL 3.9   Bilirubin, Total 0.3 - 1.2 mg/dL 1.0   Protein, Total 6.1 - 7.9 g/dL 6.7   A/G Ratio 1.0 - 5.0 gm/dL 1.4   Resulting Agency  [facility] - LAB   Specimen Collected: 08/23/21 08:04   Performed by: [facility] - LAB Last Resulted: 08/23/21 16:17  Received From: [facility]  Result Received: 11/30/21 10:49   Hemoglobin A1C 4.2 - 5.6 % 5.9 High    Average Blood Glucose (Calc) mg/dL 123   Resulting Agency  [facility] - LAB  Narrative Performed by [facility] - LAB Normal Range:    4.2 - 5.6%  Increased Risk:  5.7 - 6.4%  Diabetes:        >= 6.5%  Glycemic Control for adults with diabetes:  <7%    Specimen Collected: 08/23/21 08:04   Performed by: [facility] - LAB Last Resulted: 08/23/21 15:49  Received From: [facility]  Result Received: 11/30/21 10:49   Color Yellow, Violet, Light Violet, Dark Violet Yellow   Clarity Clear, Other Clear   Specific Gravity 1.000 - 1.030 1.020   pH, Urine 5.0 - 8.0 7.0   Protein, Urinalysis Negative, Trace mg/dL 100 Abnormal    Glucose, Urinalysis Negative mg/dL Negative   Ketones, Urinalysis Negative  mg/dL Negative   Blood, Urinalysis Negative Negative   Nitrite, Urinalysis Negative Negative   Leukocyte Esterase, Urinalysis Negative Negative   White Blood Cells, Urinalysis None Seen, 0-3 /hpf None Seen   Red Blood Cells, Urinalysis None Seen, 0-3 /hpf None Seen   Bacteria, Urinalysis None Seen /hpf None Seen   Squamous Epithelial Cells, Urinalysis Rare, Few, None Seen /hpf None Seen   Resulting Agency  [facility] - LAB   Specimen Collected: 08/23/21 08:04    Performed by: [facility] - [facility] Last Resulted: 08/23/21 09:14  Received From: [facility]  Result Received: 11/30/21 10:49   Thyroid Stimulating Hormone (TSH) 0.450-5.330 uIU/ml uIU/mL 2.457   Resulting Agency  [facility] - LAB   Specimen Collected: 08/23/21 08:04   Performed by: [place]: 08/23/21 14:07  Received From: [place]  Result Received: 11/30/21 10:49   PSA (Prostate Specific Antigen), Total 0.10 - 4.00 ng/mL 0.61   [facility] - LAB  Narrative Performed by [facility] - LAB Test results were determined with Beckman Coulter Hybritech Assay. Values obtained with different assay methods cannot be used interchangeably in serial testing. Assay results should not be interpreted as absolute evidence of the presence or absence of malignant disease  Specimen Collected: 08/23/21 08:04   Performed by: [place]: 08/23/21 14:06  Received From: [place]  Result Received: 11/30/21 10:49  I have reviewed the labs.   Pertinent Imaging: N/A    Assessment & Plan:    1. ED -He is not taking the tadalafil and he states his erections are improving -We will continue to monitor -His LH and prolactin and follow-up testosterone are pending at this time  2.  High risk hematuria -former smoker -work up in 2021 - massive BPH, bladder stones and kidney stones -no reports of gross heme -UA (07/2021) negative for micro heme  3.  Hypertension -Asymptomatic at this time, he states he feels great -I spoke with his wife this morning and she confirmed he is not taking his blood pressure medication -I asked her to call Dr. Ammie Ferrier office for further instruction to let Dr. Sabra Heck know he has not been taking his blood pressure medication and that he is hypertensive today in our office -I explained to him that with his  history of strokes, having high blood pressure puts him back at high risk for having a repeat stroke and to follow Dr. Ammie Ferrier instructions -He states the reason he stopped that medication is because it made him feel poorly, so I encouraged him to discuss with Dr. Sabra Heck about other medications he would be able to tolerate better  Return for pending labs.  These notes generated with voice recognition software. I apologize for typographical errors.  [place], [place] [address]  [place], [place] [address] [phone]

## 2021-12-07 ENCOUNTER — Encounter: Payer: Self-pay | Admitting: Urology

## 2021-12-07 ENCOUNTER — Ambulatory Visit (INDEPENDENT_AMBULATORY_CARE_PROVIDER_SITE_OTHER): Payer: Medicare Other | Admitting: Urology

## 2021-12-07 VITALS — BP 205/98 | HR 66 | Ht 70.0 in | Wt 200.0 lb

## 2021-12-07 DIAGNOSIS — R319 Hematuria, unspecified: Secondary | ICD-10-CM

## 2021-12-07 DIAGNOSIS — I10 Essential (primary) hypertension: Secondary | ICD-10-CM

## 2021-12-07 DIAGNOSIS — N529 Male erectile dysfunction, unspecified: Secondary | ICD-10-CM

## 2021-12-08 LAB — PROLACTIN: Prolactin: 8.2 ng/mL (ref 4.0–15.2)

## 2021-12-08 LAB — LUTEINIZING HORMONE: LH: 10.8 m[IU]/mL — ABNORMAL HIGH (ref 1.7–8.6)

## 2021-12-08 LAB — TESTOSTERONE: Testosterone: 856 ng/dL (ref 264–916)

## 2021-12-11 ENCOUNTER — Telehealth: Payer: Self-pay

## 2021-12-11 NOTE — Telephone Encounter (Signed)
Patient returned phone call, and I gave him results, and also let him know results were sent to him in my chart.

## 2021-12-11 NOTE — Telephone Encounter (Signed)
Attempted to reach pt, he was unavailable via telephone. Mychart msg sent.

## 2021-12-11 NOTE — Telephone Encounter (Signed)
-----   Message from Nori Riis, PA-C sent at 12/09/2021  5:50 PM EDT ----- Please let [name] know that his blood work was normal.  His testosterone level is normal.

## 2022-05-05 IMAGING — CT CT ABD-PEL WO/W CM
2 of 6 series · 13 of 32 positions shown, 18 images · IV contrast (omnipaque)
Comparison: 08/31/2008

CLINICAL DATA: Microscopic hematuria.  Known bladder stone.

EXAM:
CT ABDOMEN AND PELVIS WITHOUT AND WITH CONTRAST
TECHNIQUE: Multidetector CT imaging of the abdomen and pelvis was performed
following the standard protocol before and following the bolus
administration of intravenous contrast.
CONTRAST:  125mL OMNIPAQUE IOHEXOL 300 MG/ML  SOLN

[Series 2: axial pre · axial · non-contrast · 0.78mm/px · z∈[-299,+86]mm · 7 of 103 slices shown, 12 images]
[im 13/103  soft-tissue]
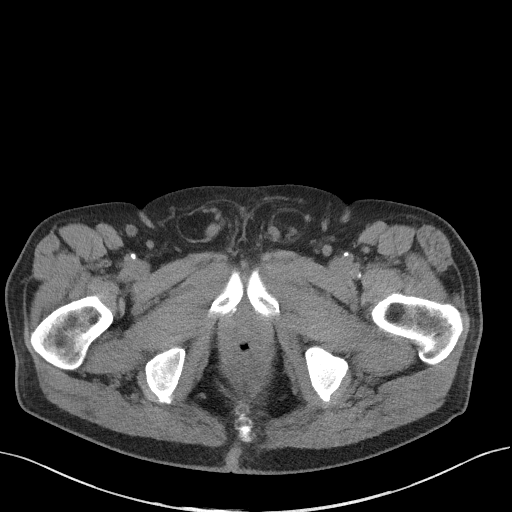
[im 13/103  bone]
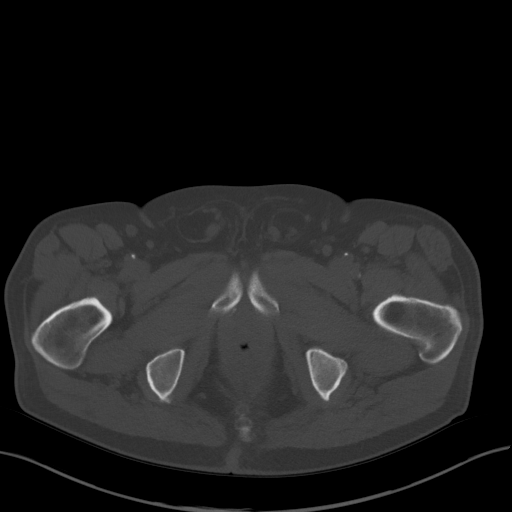
[im 26/103  soft-tissue]
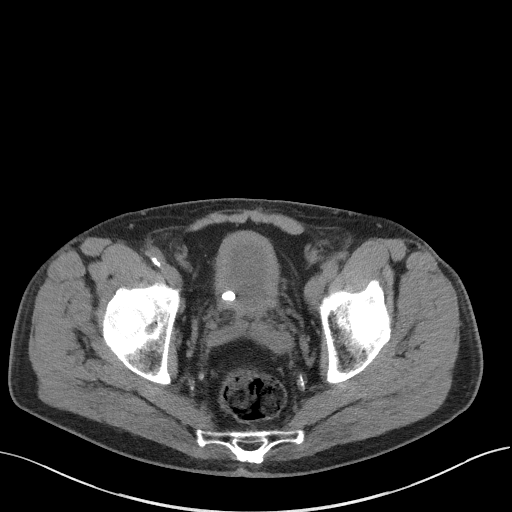
[im 39/103  soft-tissue]
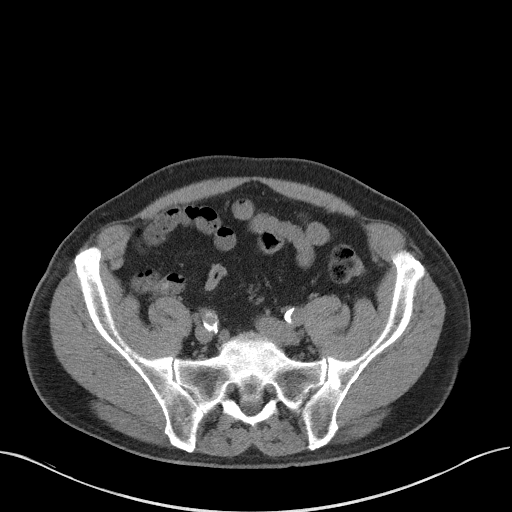
[im 52/103  soft-tissue]
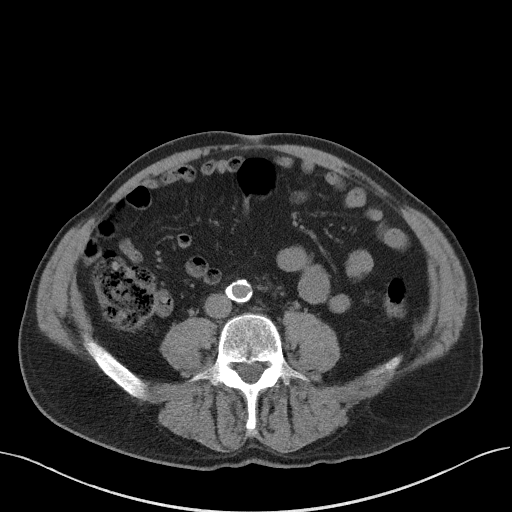
[im 52/103  lung]
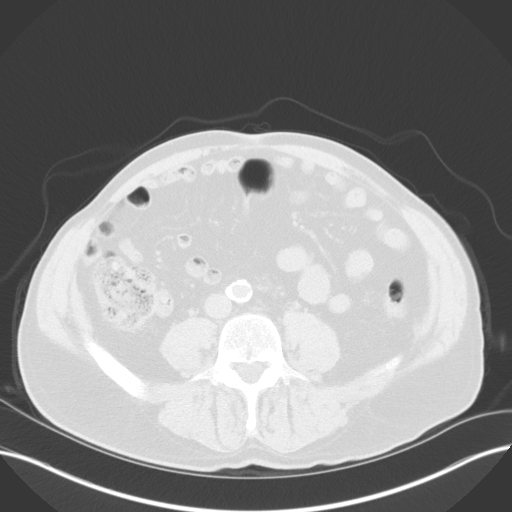
[im 64/103  soft-tissue]
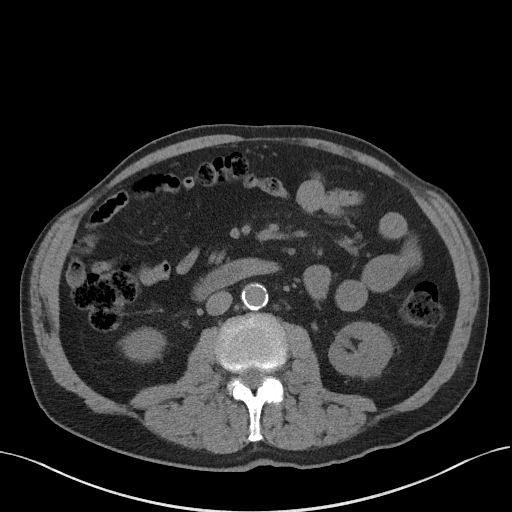
[im 64/103  lung]
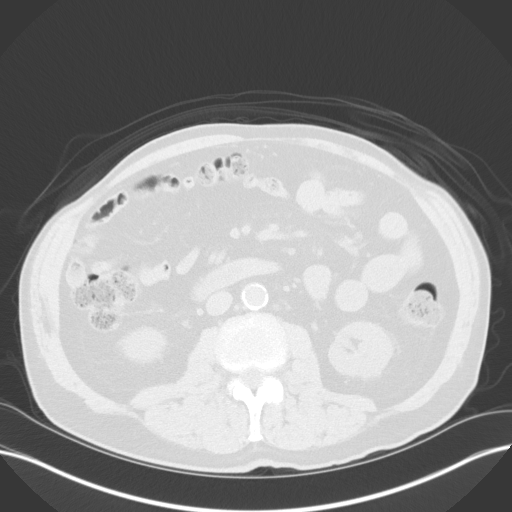
[im 77/103  soft-tissue]
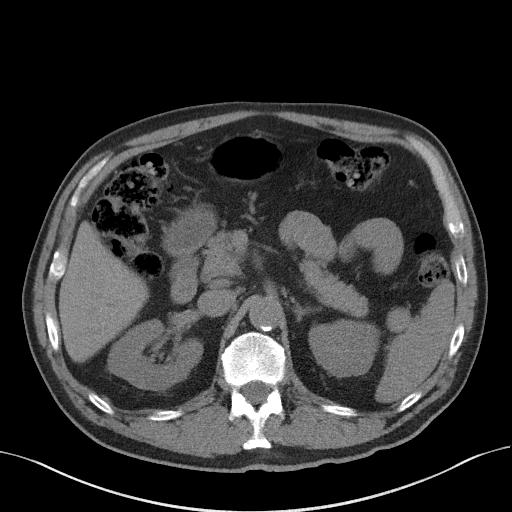
[im 77/103  lung]
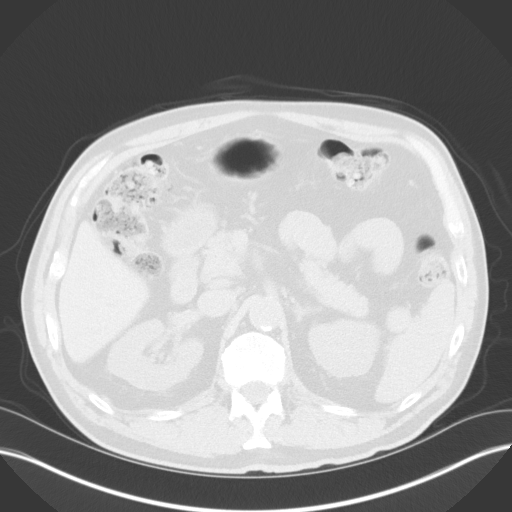
[im 90/103  soft-tissue]
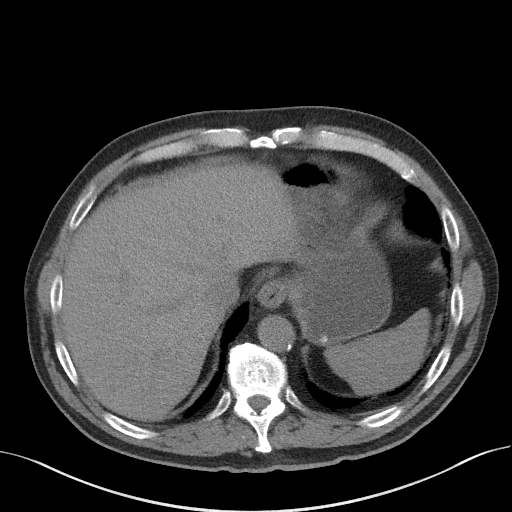
[im 90/103  lung]
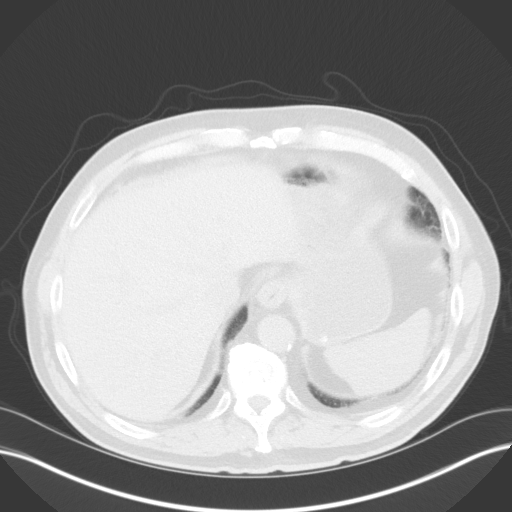

[Series 4: axial post · axial · 0.78mm/px · z∈[-304,+21]mm · 6 of 104 slices shown]
[im 13/104  soft-tissue]
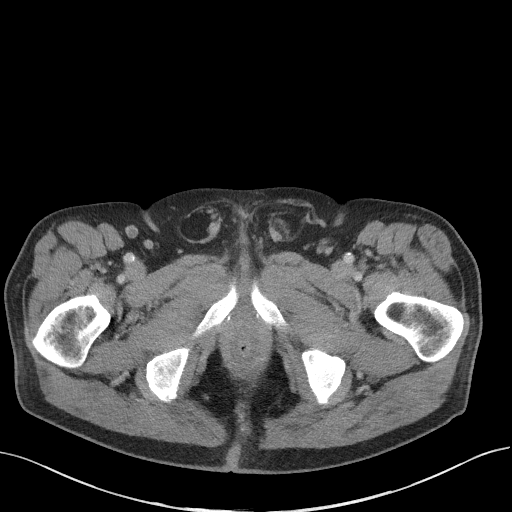
[im 26/104  soft-tissue]
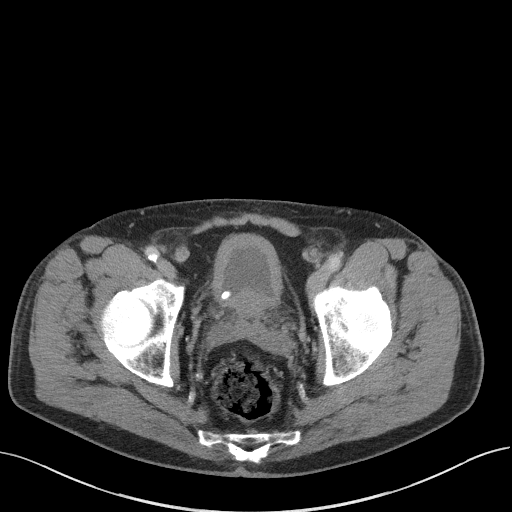
[im 39/104  soft-tissue]
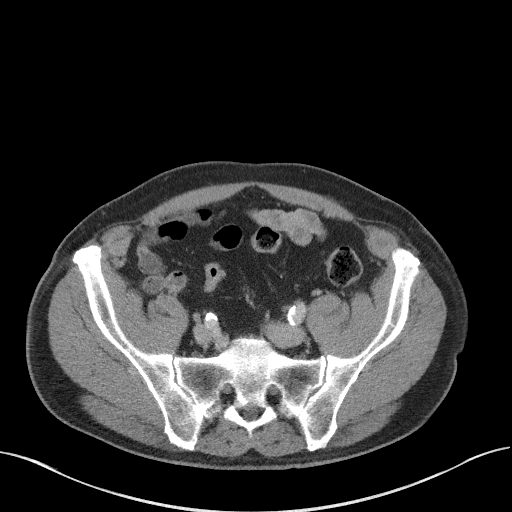
[im 52/104  soft-tissue]
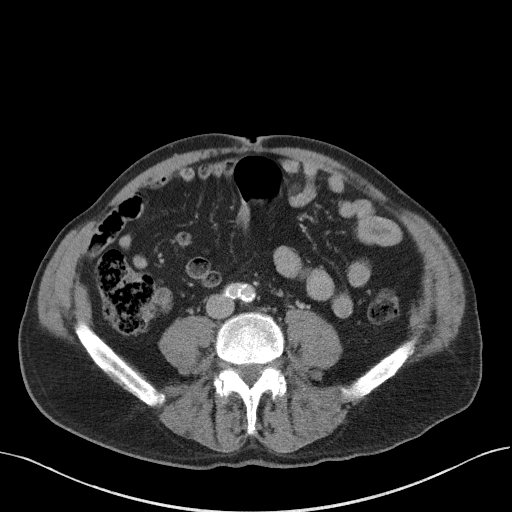
[im 65/104  soft-tissue]
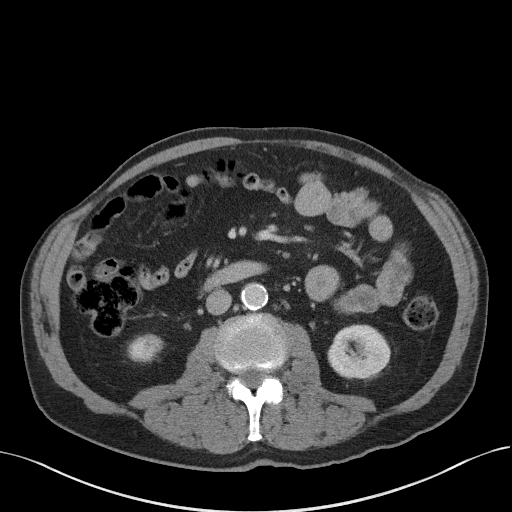
[im 78/104  soft-tissue]
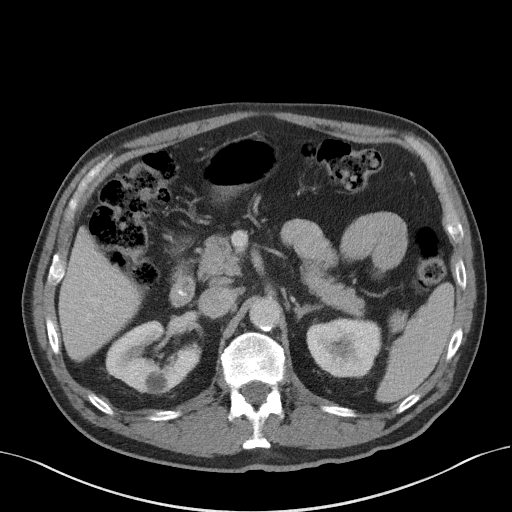

[13 of 32 positions shown; findings below may reference images not displayed]

FINDINGS: Lower chest: 3 mm right lower lobe pulmonary nodule is similar to
1484 and can be presumed benign. Normal heart size without
pericardial or pleural effusion. Multivessel coronary artery
atherosclerosis.

Hepatobiliary: Hepatic cysts. Cholecystectomy, without biliary
ductal dilatation.

Pancreas: Normal, without mass or ductal dilatation.

Spleen: Normal in size, without focal abnormality.

Adrenals/Urinary Tract: Normal adrenal glands. A stone within the
right renal pelvis measures 9 mm on [DATE]. No hydronephrosis. No
ureteric calculi. 9 mm right bladder calculus.

Interpolar 1.5 cm right renal cyst. A lower pole right renal 1.0 cm
lesion is too small to characterize but likely a cyst or minimally
complex cyst.

Anterior interpolar 9 mm left renal cyst.

Good renal collecting system opacification on delayed images.
Portions of the ureters are incompletely opacified on delayed
images.

No enhancing bladder mass or other bladder filling defect
identified. Apparent bladder wall thickening on early post-contrast
images is favored to be due to underdistention.

Stomach/Bowel: Normal stomach, without wall thickening. Large amount
of stool within the rectum. Normal terminal ileum and appendix.
Normal small bowel.

Vascular/Lymphatic: Aortic atherosclerosis. No abdominopelvic
adenopathy.

Reproductive: Moderate prostatomegaly with median lobe impression
into the urinary bladder.

Other: No significant free fluid. Small bilateral fat containing
inguinal hernias.

Musculoskeletal: No acute osseous abnormality.
IMPRESSION: 1. Right renal and bladder calculi, without obstructive uropathy. No
other explanation for hematuria.
2. Prostatomegaly with median lobe impression into the urinary
bladder.
3. Coronary artery atherosclerosis.
4. Possible constipation.
5. Aortic Atherosclerosis ([id number].W).

## 2023-09-18 ENCOUNTER — Ambulatory Visit

## 2023-09-18 ENCOUNTER — Ambulatory Visit: Attending: Internal Medicine

## 2023-09-18 ENCOUNTER — Ambulatory Visit: Admitting: Speech Pathology

## 2023-09-18 ENCOUNTER — Encounter: Payer: Self-pay | Admitting: Speech Pathology

## 2023-09-18 DIAGNOSIS — R41841 Cognitive communication deficit: Secondary | ICD-10-CM | POA: Insufficient documentation

## 2023-09-18 DIAGNOSIS — R262 Difficulty in walking, not elsewhere classified: Secondary | ICD-10-CM | POA: Diagnosis present

## 2023-09-18 DIAGNOSIS — R278 Other lack of coordination: Secondary | ICD-10-CM | POA: Insufficient documentation

## 2023-09-18 DIAGNOSIS — M6281 Muscle weakness (generalized): Secondary | ICD-10-CM

## 2023-09-18 DIAGNOSIS — R4701 Aphasia: Secondary | ICD-10-CM | POA: Diagnosis present

## 2023-09-18 DIAGNOSIS — R2681 Unsteadiness on feet: Secondary | ICD-10-CM | POA: Insufficient documentation

## 2023-09-18 NOTE — Therapy (Signed)
 OUTPATIENT OCCUPATIONAL THERAPY NEURO EVALUATION  Patient Name: Jeffery Strong MRN: [id number] DOB:[birth year], 83 y.o., male Today's Date: 09/18/2023  PCP: Dr. Oneil Pinal REFERRING PROVIDER: Dr. Evelia Parkin  END OF SESSION:  OT End of Session - 09/18/23 1754     Visit Number 1    Number of Visits 1    Date for OT Re-Evaluation 09/19/23    OT Start Time 1100    OT Stop Time 1145    OT Time Calculation (min) 45 min    Activity Tolerance Patient tolerated treatment well    Behavior During Therapy WFL for tasks assessed/performed         Past Medical History:  Diagnosis Date   Anemia    B12 deficiency   BPH associated with nocturia    CAD (coronary artery disease)    GERD (gastroesophageal reflux disease)    occasionally.  not bad anymore. does not take meds   History of hiatal hernia    History of kidney stones 12/2019   history of stones and current stones   HLD (hyperlipidemia)    Hypertension    Parkinson disease (HCC)    right side per pt report   Stroke (HCC) 06/2018   slight balance issues   Past Surgical History:  Procedure Laterality Date   BACK SURGERY  2002   neck region, fusion. no metal   CARDIAC CATHETERIZATION     Cardiac Stents  2000   d/t chest pain and pressure   CHOLECYSTECTOMY     COLONOSCOPY WITH PROPOFOL  N/A 12/23/2015   Procedure: COLONOSCOPY WITH PROPOFOL ;  Surgeon: Lamar ONEIDA Holmes, MD;  Location: [facility] ENDOSCOPY;  Service: Endoscopy;  Laterality: N/A;   CYSTOSCOPY WITH LITHOLAPAXY N/A 01/08/2020   Procedure: CYSTOSCOPY WITH LITHOLAPAXY;  Surgeon: Francisca Redell BROCKS, MD;  Location: [facility] ORS;  Service: Urology;  Laterality: N/A;   CYSTOSCOPY/URETEROSCOPY/HOLMIUM LASER/STENT PLACEMENT Right 01/08/2020   Procedure: CYSTOSCOPY/URETEROSCOPY/HOLMIUM LASER/STENT PLACEMENT;  Surgeon: Francisca Redell BROCKS, MD;  Location: [facility] ORS;  Service: Urology;  Laterality: Right;   ESOPHAGOGASTRODUODENOSCOPY (EGD) WITH PROPOFOL  N/A 12/23/2015   Procedure:  ESOPHAGOGASTRODUODENOSCOPY (EGD) WITH PROPOFOL ;  Surgeon: Lamar ONEIDA Holmes, MD;  Location: [facility] ENDOSCOPY;  Service: Endoscopy;  Laterality: N/A;   EYE SURGERY Bilateral    cataracts   HOLEP-LASER ENUCLEATION OF THE PROSTATE WITH MORCELLATION N/A 01/08/2020   Procedure: HOLEP-LASER ENUCLEATION OF THE PROSTATE WITH MORCELLATION;  Surgeon: Francisca Redell BROCKS, MD;  Location: [facility] ORS;  Service: Urology;  Laterality: N/A;   Patient Active Problem List   Diagnosis Date Noted   Benign prostatic hyperplasia with lower urinary tract symptoms 02/17/2020   Benign essential hypertension 11/24/2019   History of CVA (cerebrovascular accident) 07/08/2018   Lacunar infarct, 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 06/30/2018   Left leg weakness 06/29/2018   Parkinson disease (HCC) 06/29/2018   CAD (coronary artery disease), native coronary artery 06/29/2018   HTN (hypertension) 06/29/2018   Medicare annual wellness visit, initial 07/02/2017   B12 deficiency 06/25/2016   Hyperlipidemia, mixed 06/13/2015   Dyslipidemia 10/07/2012   GAD (generalized anxiety disorder) 10/07/2012   Insomnia 10/07/2012   Major depression 09/23/2012   Convulsions (HCC) 04/12/2012   ONSET DATE: 09/11/2023  REFERRING DIAG: S13.26 (ICD-10-CM) - Personal history of transient ischemic attack (TIA), and cerebral infarction without residual deficits   THERAPY DIAG:  Other lack of coordination  Muscle weakness (generalized)  Rationale for Evaluation and Treatment: Rehabilitation  SUBJECTIVE:  SUBJECTIVE STATEMENT: Family confirms pt is dressing self, bathing  self, etc, mainly struggling with communication/language skills. Pt accompanied by: self and family member (spouse, son, DIL present for evaluation)  PERTINENT HISTORY: Per chart: Pt was admitted at [facility] on 09/11/23 for stroke-like symptoms of aphasia and R sided weakness. Pt has prior history of CVA in 2020 as well as Parkinsons. Pt's family reports that prior to  most recent hospitalization pt was able to communicate fully as well as work in his yard independently. Pt's family states that pt's main concerns are with his speech as well as texting and writing. PMH includes L leg weakness, Parkinson disease, CAD, HTN, Hyperlipidemia, Acute lacunar infarct, B12 deficiency, convulsions, GAD, insomnia, major depression.   PRECAUTIONS: Fall  WEIGHT BEARING RESTRICTIONS: No  PAIN: Unable to verbalize consistent response, though pt does not show any physical signs of pain throughout eval  FALLS: Has patient fallen in last 6 months? Yes. 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  HAND DOMINANCE: Right  ADLs: Overall ADLs: family present 24 hrs for supv/assist as needed Transfers/ambulation related to ADLs: indep without AD with transfers; supv in community  Eating: eating with R dominant hand, cutting food independently Grooming: indep with denture care and use of electric razor  UB Dressing: indep LB Dressing: indep Toileting: standard height toilet; spouse verbalize pt to be indep with toileting tasks Bathing: able to perform standing up but does have a built in seat Tub Shower transfers: indep Equipment: none  IADLs: Family supv/assist for all tasks noted below d/t communication deficits post CVA Shopping: supv Light housekeeping: supv Meal Prep: supv Community mobility:  supv Medication management: spouse manages at baseline Financial management: family managing  Handwriting: pt able to maintain grasp of pen, handwriting limited by language deficits  MOBILITY STATUS: supv (family indicates some mild decreased attention to R side)  ACTIVITY TOLERANCE: Activity tolerance: WFL for tasks assessed  UPPER EXTREMITY ROM:  BUEs WFL  UPPER EXTREMITY MMT:  BUEs grossly 5/5  HAND FUNCTION: Grip strength: Right: 65 lbs; Left: 56 lbs, Lateral pinch: Right: 16 lbs, Left: 17 lbs, and 3 point pinch: Right: 15 lbs, Left: 11 lbs  COORDINATION: 9 Hole Peg test: Right: NT sec; Left: NT sec; not officially tested d/t communication deficits, but pt does demonstrate good ability to manipulate pegs in either hand.  Family denies pt with any difficulty manipulating eating or grooming utensils in R dominant hand.  SENSATION: Unable to accurately assess  EDEMA: No visible edema in BUEs  MUSCLE TONE: RUE: Within functional limits and LUE: Within functional limits  COGNITION: Overall cognitive status: Impaired; globally aphasic; see SLP eval for additional details  VISION: Wears reading glasses.  Unable to accurately assess oculomotor but appears WNL within functional contexts of evaluation activities.    PERCEPTION: Impaired: Inattention/neglect: Family describes some mild decreased attention to R side.  Education provided (see tx section below).  PRAXIS: WFL  OBSERVATIONS:  Pt pleasant, cooperative.  Family very supportive and able to assist with describing pt's prior and current function.                                                                                                     TREATMENT DATE: 09/18/23 Evaluation completed.  Self Care: -Education provided to family re: strategies to increase attention to R side -position family/friends to pt's R side when eating or visiting  -spouse to walk on pt's R side to reduce bumping into objects in unfamiliar  environments/community settings and to further encourage head turns to the R  -Recommended spouse supv bathing to confirm safety/thoroughness (specifically with potential decreased attention to R side); family confirms spouse can provide this supv until pts demonstrates indep  PATIENT EDUCATION: Education details: OT role, poc, strategies to increase attention to R side Person educated: Patient, Spouse, and son and DIL Education method: Explanation Education comprehension: verbalized understanding  HOME EXERCISE PROGRAM: N/A  GOALS: Goals reviewed with patient? Yes  SHORT TERM GOALS: Target date: 09/18/23  Family will verbalize understanding of strategies to improve pt's attention to R sided personal and extra-personal space. Baseline: Eval: Education provided; family verbalizes understanding. Goal status: INITIAL/achieved at eval  ASSESSMENT:  CLINICAL IMPRESSION: Patient is an 83 y.o. male who was seen today for occupational therapy evaluation for functional decline related to recent CVA.  Family present for eval d/t pt presenting with global aphasia.  Family verbalizes pt has been doing well with ADLs, and language/communication has been pt's biggest barrier post stroke.  Pt presents with good BUE strength/coordination skills.  Mild tremor noted in RUE, present prior to stroke and related to PD dx.  Family endorses pt to be performing basic ADLs with supv-indep at present, though family is present in the home to provide 24 hour supv as needed.  Continued encouragement for family to assist/supv with all IADLs d/t cognitive/communication deficits.  Son expressed some mild R sided inattention.  Family educated on strategies today to promote increased attention to R side during ADLs/IADLs/mobility in the community; all family members verbalized understanding.  No additional skilled OT indicated at this time.  Family in agreement with plan.    PERFORMANCE DEFICITS: in functional skills  including IADLs, mobility, balance, and decreased knowledge of precautions, cognitive skills including attention, memory, perception, safety awareness, and understand, and psychosocial skills including coping strategies, environmental adaptation, interpersonal interactions, and routines and behaviors.   IMPAIRMENTS: are limiting patient from IADLs, work, and social participation.   CO-MORBIDITIES: has co-morbidities such as PD, HTN, previous CVA that affects occupational performance. Patient will benefit from skilled OT to address above impairments and improve overall function.  MODIFICATION OR ASSISTANCE TO COMPLETE EVALUATION: Min-Moderate modification of tasks or assist with assess necessary to complete an evaluation.  OT OCCUPATIONAL PROFILE AND HISTORY: Detailed assessment: Review of records and additional review of physical, cognitive, psychosocial history related to current functional performance.  CLINICAL DECISION MAKING: Moderate - several treatment options, min-mod task modification necessary  REHAB POTENTIAL: Good for OT goal noted above  EVALUATION COMPLEXITY: Moderate    PLAN:  OT FREQUENCY: one time visit  OT DURATION: other: evaluation only   PLANNED INTERVENTIONS: [id number] OT Re-evaluation, [id number] self care/ADL training, 02249 Physical Performance Testing, coping strategies training, and patient/family education  RECOMMENDED OTHER SERVICES: Pt participating in PT and SLP evaluations this date  CONSULTED AND AGREED WITH PLAN OF CARE: Patient and family member/caregiver  PLAN FOR NEXT SESSION: N/A (eval only)  Inocente Blazing, MS, OTR/L   Inocente MARLA Blazing, OT 09/18/2023, 5:55 PM

## 2023-09-18 NOTE — Therapy (Signed)
 OUTPATIENT PHYSICAL THERAPY NEURO EVALUATION   Patient Name: Jeffery Strong MRN: [id number] DOB:Aug 12, 1940, 83 y.o., male Today's Date: 09/18/2023   PCP: Oneil Pinal, MD REFERRING PROVIDER: Evelia Parkin, MD  END OF SESSION:  PT End of Session - 09/18/23 1017     Visit Number 1    Number of Visits 24    Date for PT Re-Evaluation 12/11/23    PT Start Time 1017    PT Stop Time 1040    PT Time Calculation (min) 23 min    Equipment Utilized During Treatment Gait belt    Activity Tolerance Patient tolerated treatment well    Behavior During Therapy WFL for tasks assessed/performed          Past Medical History:  Diagnosis Date   Anemia    B12 deficiency   BPH associated with nocturia    CAD (coronary artery disease)    GERD (gastroesophageal reflux disease)    occasionally.  not bad anymore. does not take meds   History of hiatal hernia    History of kidney stones 12/2019   history of stones and current stones   HLD (hyperlipidemia)    Hypertension    Parkinson disease (HCC)    right side per pt report   Stroke (HCC) 06/2018   slight balance issues   Past Surgical History:  Procedure Laterality Date   BACK SURGERY  2002   neck region, fusion. no metal   CARDIAC CATHETERIZATION     Cardiac Stents  2000   d/t chest pain and pressure   CHOLECYSTECTOMY     COLONOSCOPY WITH PROPOFOL  N/A 12/23/2015   Procedure: COLONOSCOPY WITH PROPOFOL ;  Surgeon: Lamar ONEIDA Holmes, MD;  Location: [facility] ENDOSCOPY;  Service: Endoscopy;  Laterality: N/A;   CYSTOSCOPY WITH LITHOLAPAXY N/A 01/08/2020   Procedure: CYSTOSCOPY WITH LITHOLAPAXY;  Surgeon: Francisca Redell BROCKS, MD;  Location: [facility] ORS;  Service: Urology;  Laterality: N/A;   CYSTOSCOPY/URETEROSCOPY/HOLMIUM LASER/STENT PLACEMENT Right 01/08/2020   Procedure: CYSTOSCOPY/URETEROSCOPY/HOLMIUM LASER/STENT PLACEMENT;  Surgeon: Francisca Redell BROCKS, MD;  Location: [facility] ORS;  Service: Urology;  Laterality: Right;   ESOPHAGOGASTRODUODENOSCOPY  (EGD) WITH PROPOFOL  N/A 12/23/2015   Procedure: ESOPHAGOGASTRODUODENOSCOPY (EGD) WITH PROPOFOL ;  Surgeon: Lamar ONEIDA Holmes, MD;  Location: [facility] ENDOSCOPY;  Service: Endoscopy;  Laterality: N/A;   EYE SURGERY Bilateral    cataracts   HOLEP-LASER ENUCLEATION OF THE PROSTATE WITH MORCELLATION N/A 01/08/2020   Procedure: HOLEP-LASER ENUCLEATION OF THE PROSTATE WITH MORCELLATION;  Surgeon: Francisca Redell BROCKS, MD;  Location: [facility] ORS;  Service: Urology;  Laterality: N/A;   Patient Active Problem List   Diagnosis Date Noted   Benign prostatic hyperplasia with lower urinary tract symptoms 02/17/2020   Benign essential hypertension 11/24/2019   History of CVA (cerebrovascular accident) 07/08/2018   Lacunar infarct, 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 06/30/2018   Left leg weakness 06/29/2018   Parkinson disease (HCC) 06/29/2018   CAD (coronary artery disease), native coronary artery 06/29/2018   HTN (hypertension) 06/29/2018   Medicare annual wellness visit, initial 07/02/2017   B12 deficiency 06/25/2016   Hyperlipidemia, mixed 06/13/2015   Dyslipidemia 10/07/2012   GAD (generalized anxiety disorder) 10/07/2012   Insomnia 10/07/2012   Major depression 09/23/2012   Convulsions (HCC) 04/12/2012    ONSET DATE: 09/11/23  REFERRING DIAG: S13.26 (ICD-10-CM) - Personal history of transient ischemic attack (TIA), and cerebral infarction without residual deficits   THERAPY DIAG:  Muscle weakness (generalized) - Plan: PT plan of care cert/re-cert  Other lack of  coordination - Plan: PT plan of care cert/re-cert  Difficulty in walking, not elsewhere classified - Plan: PT plan of care cert/re-cert  Unsteadiness on feet - Plan: PT plan of care cert/re-cert  Rationale for Evaluation and Treatment: Rehabilitation  SUBJECTIVE:                                                                                                                                                                                              SUBJECTIVE STATEMENT: Pt reports that he is feeling okay today. Pt's son states that he is doing pretty well overall, but he has had some issues with his balance after the most recent stroke. Pt accompanied by: family member Wife, son, daughter-in law  PERTINENT HISTORY: Pt was admitted at [facility] on 09/11/23 for stroke-like symptoms of aphasia and R sided weakness. Pt has prior history of CVA in 2020 as well as Parkinsons. Pt's family reports that prior to most recent hospitalization pt was able to communicate fully as well as work in his yard independently. Pt's family states that pt's main concerns are with his speech as well as texting and writing. PMH includes L leg weakness, Parkinson disease, CAD, HTN, Hyperlipidemia, Acute lacunar infarct, B12 deficiency, convulsions, GAD, insomnia, major depression.  PAIN:  Are you having pain? No  PRECAUTIONS: None  RED FLAGS: None   WEIGHT BEARING RESTRICTIONS: No  FALLS: Has patient fallen in last 6 months? No  LIVING ENVIRONMENT: Lives with: lives with their family and lives with their spouse Lives in: House/apartment Stairs: Yes: External: 5 steps; can reach both Has following equipment at home: Single point cane and Walker - 2 wheeled  PLOF: Independent  PATIENT GOALS: Get back to doing yard work. Be able to text and write  OBJECTIVE:  Note: Objective measures were completed at Evaluation unless otherwise noted.  DIAGNOSTIC FINDINGS:  CT Brain w/o contrast (09/11/23):  Negative for acute large territory infarct or sizable intracranial hemorrhage.   CTA Head and Neck w/ & w/o contrast (09/11/23): Focal occlusion versus high-grade stenosis involving the inferior trunk of the left MCA artery (series 5, image 437) with suggestion of reconstitution of flow distally.  Suggestion of focal stenosis of the left P1 segment (series 5, image 450; series 6, image 65)  No hemodynamically significant arterial stenosis within the  neck.    CT Perfusion (09/11/23): No core infarct. Perfusion abnormality noted in the left posterior parietal lobe suggestive of ischemia potentially in the posterior cortical vascular watershed zone.   COGNITION: Overall cognitive status: Difficulty to assess due to: Communication impairment   SENSATION: WFL  COORDINATION: Difficult to assess  due to pt's cognition- noticeable more difficulty with finger-to-nose on L; heel-to-shin not attempted   POSTURE: rounded shoulders, forward head, and increased thoracic kyphosis   LOWER EXTREMITY MMT:  Unable to formally assess in gold standard positions due to pt's cognition; most LE muscles are minimum 3/5 due to pt's ability to perform movements against gravity  MMT Right Eval Left Eval  Hip flexion    Hip extension    Hip abduction    Hip adduction    Hip internal rotation    Hip external rotation    Knee flexion    Knee extension    Ankle dorsiflexion    Ankle plantarflexion    Ankle inversion    Ankle eversion    (Blank rows = not tested)  BED MOBILITY:  Pt's family reports that he is independent with bed mobility  TRANSFERS: Sit to stand: SBA  Assistive device utilized: None     Stand to sit: SBA  Assistive device utilized: None      GAIT: Findings: Gait Characteristics: step through pattern, decreased arm swing- Right, decreased step length- Right, decreased step length- Left, decreased stride length, narrow BOS, poor foot clearance- Right, and poor foot clearance- Left, Distance walked: 80ft, Assistive device utilized:None, Level of assistance: CGA, and Comments: Pt with overall slow cadence of walking and was easily distracted by surroundings today  FUNCTIONAL TESTS:  5 times sit to stand: 24.04 seconds with UE support on armrests of green chair 10 meter walk test: 21.75 seconds = 0.46 m/s  PATIENT SURVEYS:  Stroke Impact Scale: 55/80                                                                                                                               TREATMENT DATE: 09/18/23   Self-Care and Home Management: - Pt provided with HEP below, thorough demonstrations and instructions provided to pt and family with pt returning demonstration and performing HEP in session- pt requires multiple verbal, visual, and tactile cues to perform all exercises with tendency to perseverate on counting out loud and continuing previous exercise when attempting to introduce new exercise:  Access Code: [email or web address]  URL: [email or web address]  Date: 09/18/2023  Prepared by: Marina  Moser  Exercises -  Leg Extension  - 1 x daily - 7 x weekly - 2 sets - 10 reps - 5 hold  - Standing March with Counter Support  - 1 x daily - 7 x weekly - 2 sets - 10 reps - 5 hold  - Heel Raises with Counter Support  - 1 x daily - 7 x weekly - 2 sets - 10 reps - 5 hold  - Mini Squat with Counter Support  - 1 x daily - 7 x weekly - 2 sets - 10 reps - 5 hold    PATIENT EDUCATION: Education details: Pt educated on purpose of physical therapy for his recovery and was instructed on HEP provided below Person educated: Patient,  Spouse, and Child(ren) Education method: Explanation, Demonstration, Tactile cues, Verbal cues, and Handouts Education comprehension: returned demonstration, verbal cues required, tactile cues required, and needs further education  HOME EXERCISE PROGRAM: Access Code: [email or web address]  URL: [email or web address]  Date: 09/18/2023  Prepared by: Marina  Moser  Exercises -  Leg Extension  - 1 x daily - 7 x weekly - 2 sets - 10 reps - 5 hold  - Standing March with Counter Support  - 1 x daily - 7 x weekly - 2 sets - 10 reps - 5 hold  - Heel Raises with Counter Support  - 1 x daily - 7 x weekly - 2 sets - 10 reps - 5 hold  - Mini Squat with Counter Support  - 1 x daily - 7 x weekly - 2 sets - 10 reps - 5 hold   GOALS: Goals reviewed with patient? Yes  SHORT TERM GOALS: Target date:  10/09/2023   Patient will be independent in home exercise program to improve strength/mobility for better functional independence with ADLs. Baseline: HEP provided at eval- see above Goal status: INITIAL   LONG TERM GOALS: Target date: 12/11/2023   Patient (> 80 years old) will complete five times sit to stand test in < 15 seconds indicating an increased LE strength and improved balance. Baseline: 24.04 seconds with UE support on armrests Goal status: INITIAL  2.  Patient will increase 10 meter walk test to >0.62m/s as to improve gait speed for better community ambulation and to reduce fall risk. Baseline: 0.76m/s with no AD Goal status: INITIAL  3.  Patient will increase her Stroke Impact Scale score by >10 points for improved function and independence. Baseline: 55/80 Goal status: INITIAL  4.  Patient will demonstrate an improved Berg Balance Score of > as to demonstrate improved balance with ADLs such as sitting/standing and transfer balance and reduced fall risk. Baseline: assess at next session Goal status: INITIAL  5.  Patient will increase six minute walk test distance to >1000 for progression to community ambulator and improve gait ability Baseline: assess at next session Goal status: INITIAL  6.  Patient will reduce timed up and go to <11 seconds to reduce fall risk and demonstrate improved transfer/gait ability. Baseline: assess at next session Goal status: INITIAL  ASSESSMENT:  CLINICAL IMPRESSION: Patient is a 83 y.o. male who was seen today for physical therapy evaluation and treatment for balance and gait deficits s/p CVA on 09/11/23. Pt presented with significant aphasia, limiting aspects of PT examination today. However, pt was able to perform gait speed assessment and 5xSTS, which revealed slow gait speed that may limit pt's community ambulation and decreased overall LE strength. Pt required extensive verbal, visual, and tactile cueing throughout session to perform  tasks. Pt has fantastic family support who are very involved in his care. Pt is able to perform LE motions easily with extensive cueing and demonstrations, which is promising from a prognostic aspects and suggests that pt will need PT intervention to re-familiarize pt with motor planning and movement patterns. Pt would benefit from skilled PT intervention to improve his listed deficits and allow for pt to return to PLOF and perform ADLs independently.   OBJECTIVE IMPAIRMENTS: Abnormal gait, decreased activity tolerance, decreased balance, decreased cognition, decreased coordination, decreased endurance, decreased knowledge of condition, decreased knowledge of use of DME, decreased mobility, difficulty walking, decreased ROM, decreased strength, decreased safety awareness, hypomobility, impaired perceived functional ability, impaired flexibility, impaired vision/preception, improper body mechanics, and postural dysfunction.  ACTIVITY LIMITATIONS: carrying, lifting, bending, sitting, standing, squatting, stairs, transfers, self feeding, reach over head, locomotion level, and caring for others  PARTICIPATION LIMITATIONS: meal prep, cleaning, laundry, medication management, interpersonal relationship, driving, shopping, community activity, yard work, and church  PERSONAL FACTORS: Age, Past/current experiences, and 3+ comorbidities: L leg weakness, Parkinson disease, CAD, HTN, Hyperlipidemia, Acute lacunar infarct, B12 deficiency, convulsions, GAD, insomnia, major depression. are also affecting patient's functional outcome.   REHAB POTENTIAL: Good  CLINICAL DECISION MAKING: Evolving/moderate complexity  EVALUATION COMPLEXITY: Moderate  PLAN:  PT FREQUENCY: 2x/week  PT DURATION: 12 weeks  PLANNED INTERVENTIONS: 97164- PT Re-evaluation, 97750- Physical Performance Testing, 97110-Therapeutic exercises, 97530- Therapeutic activity, [id number]- Neuromuscular re-education, 97535- Self Care, [id number]- Manual  therapy, [id number]- Gait training, [id number]- Orthotic/Prosthetic subsequent, [phone]- Canalith repositioning, [id number]- Splinting, [id number]- Electrical stimulation (manual), [id number]- Ultrasound, [id number] (1-2 muscles), [id number] (3+ muscles)- Dry Needling, Patient/Family education, Balance training, Stair training, Taping, Joint mobilization, Spinal mobilization, Vestibular training, Visual/preceptual remediation/compensation, Cognitive remediation, DME instructions, Cryotherapy, and Moist heat  PLAN FOR NEXT SESSION: Assess , TUG, BERG. Review HEP compliance. Begin dynamic balance training  Anakin Varkey, SPT  This entire session was performed under direct supervision and direction of a licensed therapist/therapist assistant . I have personally read, edited and approve of the note as written.   Marina  Leopoldo PT, DPT Physical Therapist - [facility]  Outpatient Physical Therapy- Main [facility] [phone]    09/18/2023, 1:44 PM

## 2023-09-18 NOTE — Therapy (Signed)
 OUTPATIENT SPEECH LANGUAGE PATHOLOGY  EVALUATION   Patient Name: Jeffery Strong MRN: [id number] DOB:17-Mar-1940, 83 y.o., male Today's Date: 09/18/2023  PCP: Cleotilde Oneil FALCON, MD  REFERRING PROVIDER: Sheena Pod MD    End of Session - 09/18/23 1149     Visit Number 1    Number of Visits 25    Date for SLP Re-Evaluation 12/17/23    SLP Start Time 1145    SLP Stop Time  1230    SLP Time Calculation (min) 45 min    Activity Tolerance Patient tolerated treatment well           Patient Active Problem List   Diagnosis Date Noted   Benign prostatic hyperplasia with lower urinary tract symptoms 02/17/2020   Benign essential hypertension 11/24/2019   History of CVA (cerebrovascular accident) 07/08/2018   Lacunar infarct, 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 06/30/2018   Left leg weakness 06/29/2018   Parkinson disease (HCC) 06/29/2018   CAD (coronary artery disease), native coronary artery 06/29/2018   HTN (hypertension) 06/29/2018   Medicare annual wellness visit, initial 07/02/2017   B12 deficiency 06/25/2016   Hyperlipidemia, mixed 06/13/2015   Dyslipidemia 10/07/2012   GAD (generalized anxiety disorder) 10/07/2012   Insomnia 10/07/2012   Major depression 09/23/2012   Convulsions (HCC) 04/12/2012    ONSET DATE: 09/11/23   REFERRING DIAG: CVA  THERAPY DIAG:  Aphasia  Cognitive communication deficit  Rationale for Evaluation and Treatment Rehabilitation  SUBJECTIVE:   SUBJECTIVE STATEMENT: Are we going? Pt accompanied by: family member and Jeffery Strong (wife) and daughter-in-law Jeffery Strong)  PERTINENT HISTORY: Jeffery Strong is a 83 y.o. male with past medical history including Parkinson's disease, prior CVA (07/08/2018), depression, HTN who presented to [facility] on 09/11/23 with aphasia and confusion. MRI showed Multiple small infarcts in the left anterior temporal and parietal lobes and left MCA-PCA watershed distribution. Evaluated by SLP during acute  stay with diet recommendation dysphagia IDDSI L6 soft and bite sized L0 Thin Liquids. SLP noted aphasia with primarily expressive deficits, but difficulty with complex comprehension. Previously followed by Dr. Maree with baseline cognitive deficits secondary to PD (SLUMS 25/30 in 2018), difficulty remembering names, memory deficits reported since 2014.  DIAGNOSTIC FINDINGS: See above  PAIN:  Are you having pain? No   FALLS: Has patient fallen in last 6 months?  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   PATIENT GOALS   (patient states nothing, spouse states we want to be able to understand him)  OBJECTIVE:   COGNITIVE COMMUNICATION: Overall cognitive status: Impaired and History of cognitive impairments - at baseline Areas of impairment:  Attention: Impaired: Sustained Auditory comprehension: Impaired: severe Verbal expression: Impaired: severe Functional communication: Impaired: unreliable Y/N, minimal functional communication  AUDITORY COMPREHENSION: Overall auditory comprehension: Impaired: simple YES/NO questions: Impaired: simple 30% Following directions: Impaired: simple 0% Conversation: Simple Interfering components: attention, awareness, and processing speed Effective technique: extra processing time, pausing, repetition/stressing words, visual/gestural cues, and gain attention, break if stuck in set   READING COMPREHENSION: Impaired: not formally assessed   EXPRESSION: verbal  VERBAL EXPRESSION: Level of generative/spontaneous verbalization: sentence stereotypic/overlearned only Automatic speech: name: impaired, social response: impaired, and counting: able to count 1-17 with initial cues  Repetition: Impaired: Word Naming: Responsive:  assessment to be continued and  Confrontation: 0-25% 1/20 Pragmatics: Impaired: eye contact Comments: Attention interferes; highly distractible Interfering components: attention Effective technique: Visual/gestural cues were helpful for counting Non-verbal means of communication: N/A   WRITTEN EXPRESSION: Dominant hand: right   Written expression: Not tested  MOTOR SPEECH: Overall motor speech: appears functional however difficult to assess thoroughly due to severity of aphasia Respiration: WFL Phonation: normal Resonance: WFL Articulation: Appears intact Intelligibility: output is primarily jargon Motor planning: difficult to assess as comprehension limits ability to follow commands Motor speech errors: unaware   ORAL MOTOR EXAMINATION: Facial : WFL Lingual: WFL Velum: WFL Mandible: WFL Cough: WFL Voice: WFL   STANDARDIZED ASSESSMENTS:   Western Aphasia Battery- Revised  Spontaneous Speech                           Information content               2/10                                            Fluency                                 7/10                                          Comprehension     Yes/No questions                 24/60                                           Auditory Word Recognition  13/60                                Sequential Commands       DNT/80                              Repetition                             10/100                                        Naming    Object Naming                     3/60                                           Word Fluency                        DNT/20  Sentence Completion          DNT/10                                             Responsive Speech              DNT/10                                         Aphasia Quotient                  TBD/100         Pt's severity rating and Aphasia Quotient will be determined pending completion of assessment, but is  anticipated to be very severe. (0-25=very severe, 26-50=severe, 51-75=moderate, 76 and above is mild). Pt's presentation is most consistent with Wernicke's subtype, characterized by severely impaired but fluent verbal expression, severely impaired auditory comprehension, and severely impaired repetition. Reading and writing were not formally assessed but are likely to be severely impaired given extent of expressive and receptive deficits. Pt's verbal expression is characterized by fluent jargon with frequent neologisms, with occasional output of overlearned phrases and stereotypic utterances It'll be I don't need to . Patient has little awareness of errors.    PATIENT REPORTED OUTCOME MEASURES (PROM): to be administered in subsequent session; suggest caregiver assessment  TODAY'S TREATMENT:  Explained concept of aphasia and deficit areas, as well as factors impacting prognosis such as poor awareness, prior CVA, prior memory deficits. Suggested automatic speech activities to try with patient as well as communication approach to increase comprehension (reduce distractions, ensure pt's attention to face, simple speech (single words or very short phrases), visual/gestural cues. Educated on working on Y/N with patient nodding and shaking head while responding, which seemed to improve accuracy vs pointing to words.   PATIENT EDUCATION: Education details: See today's treatment for details Person educated: Patient's wife and daughter in law Education method: Explanation Education comprehension: verbalized understanding and needs further education  HOME EXERCISE PROGRAM:        Work on Y/N, counting   GOALS:  Goals reviewed with patient? Yes      SHORT TERM GOALS: Target date: 10 sessions  Patient will accurately respond to simple Y/N questions 80% accuracy with external supports as needed. Baseline: Goal status: INITIAL   2.  Patient will complete standardized assessment of language (and  cognition, as appropriate). Baseline:  Goal status: INITIAL  3.  Patient will demonstrate awareness of communication breakdowns in 3/5 opportunities with usual moderate cues. Baseline:  Goal status: INITIAL    LONG TERM GOALS: Target date: 12/17/23  With Moderate A, patient will verify messages when written keyword with yes/no question is provided in conversation with 80% accuracy to reduce communication breakdowns. Baseline:  Goal status: INITIAL  2.  With Mod I, patient/family will demonstrate understanding of the following concepts: aphasia, spontaneous recovery, communication vs conversation, strengths/strategies to promote success, local resources by answering multiple choice questions with 90% accuracy when provided supported conversation in order to increase patient's participation in medical care. Baseline:  Goal status: INITIAL  3.  Patient will effectively communicate wants/needs via multimodal means >80% accuracy with assistance from communication partners in order to improve expressive language. Baseline:  Goal status: INITIAL    ASSESSMENT:  CLINICAL IMPRESSION: Patient is an 83 y.o. male who was seen today for assessment of speech, language and cognition due to recent CVA. Patient presents with severe Wernicke's aphasia impacting both expressive and receptive language. Patient has minimal functional communication at this time (no reliable Y/N) and has poor awareness of both expressive and receptive deficits. While it is difficult to formally assess cognition given severity of his language impairment, he is also noted to have reduced sustained attention, as he was frequently distracted during testing and seemed to want to leave early (ST was also last evaluation of 3 this date). He has a supportive family who express desire to communicate with him and understand him. Patient would benefit from skilled ST to address language and cognitive impairments in order to increase  ability to communicate wants and needs, follow commands, for increased safety and improved social connection with family and friends. Caregiver involvement likely necessary to make functional gains given the severity of client's deficits and his reduced awareness.  OBJECTIVE IMPAIRMENTS include attention, memory, awareness, expressive language, receptive language, and aphasia. These impairments are limiting patient from return to work, managing medications, managing appointments, managing finances, household responsibilities, ADLs/IADLs, and effectively communicating at home and in community. Factors affecting potential to achieve goals and functional outcome are ability to learn/carryover information, co-morbidities, cooperation/participation level, severity of impairments, and family/community support (+). Patient will benefit from skilled SLP services to address above impairments and improve overall function.  REHAB POTENTIAL: Good *with caregiver support  PLAN: SLP FREQUENCY: 2x/week  SLP DURATION: 12 weeks  PLANNED INTERVENTIONS: Cognitive reorganization, Internal/external aids, Functional tasks, Multimodal communication approach, SLP instruction and feedback, Compensatory strategies, Patient/family education, and 07492 Treatment of speech (30 or 45 min)    Jeffery Landry [place], MS, Corporate investment banker Pathologist [phone]   [facility] at [facility] [address] [place], [place], [address] Phone: [phone]   Fax:  [phone]            [facility] Out[facility] Rehabilitation at [facility] [address] [place], [place], [address] Phone: [phone]   Fax:  [phone]  Patient Details  Name: Jeffery Strong MRN: [id number] Date of Birth: Dec 29, 1940 Referring Provider:  Cleotilde Oneil FALCON, MD  Encounter Date: 09/18/2023   Jeffery Strong, CCC-SLP 09/18/2023, 11:50 AM  [facility]  Outpatient Rehabilitation at [facility] [address] [place], [place], [address] Phone: [phone]   Fax:  [phone]

## 2023-09-24 ENCOUNTER — Ambulatory Visit: Admitting: Physical Therapy

## 2023-09-24 ENCOUNTER — Ambulatory Visit: Admitting: Occupational Therapy

## 2023-09-24 ENCOUNTER — Ambulatory Visit

## 2023-09-24 DIAGNOSIS — M6281 Muscle weakness (generalized): Secondary | ICD-10-CM

## 2023-09-24 DIAGNOSIS — R4701 Aphasia: Secondary | ICD-10-CM

## 2023-09-24 DIAGNOSIS — R262 Difficulty in walking, not elsewhere classified: Secondary | ICD-10-CM

## 2023-09-24 DIAGNOSIS — R278 Other lack of coordination: Secondary | ICD-10-CM

## 2023-09-24 DIAGNOSIS — R2681 Unsteadiness on feet: Secondary | ICD-10-CM

## 2023-09-24 DIAGNOSIS — R41841 Cognitive communication deficit: Secondary | ICD-10-CM

## 2023-09-24 NOTE — Therapy (Addendum)
 OUTPATIENT SPEECH LANGUAGE PATHOLOGY  TREATMENT   Patient Name: Jeffery Strong MRN: [id number] DOB:10-21-1940, 83 y.o., male Today's Date: 09/24/2023  PCP: Cleotilde Oneil FALCON, MD  REFERRING PROVIDER: Sheena Pod MD    End of Session - 09/24/23 1442     Visit Number 2    Number of Visits 25    Date for SLP Re-Evaluation 12/17/23    SLP Start Time 1530    SLP Stop Time  1615    SLP Time Calculation (min) 45 min    Activity Tolerance Patient tolerated treatment well           Patient Active Problem List   Diagnosis Date Noted   Benign prostatic hyperplasia with lower urinary tract symptoms 02/17/2020   Benign essential hypertension 11/24/2019   History of CVA (cerebrovascular accident) 07/08/2018   Lacunar infarct, 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 06/30/2018   Left leg weakness 06/29/2018   Parkinson disease (HCC) 06/29/2018   CAD (coronary artery disease), native coronary artery 06/29/2018   HTN (hypertension) 06/29/2018   Medicare annual wellness visit, initial 07/02/2017   B12 deficiency 06/25/2016   Hyperlipidemia, mixed 06/13/2015   Dyslipidemia 10/07/2012   GAD (generalized anxiety disorder) 10/07/2012   Insomnia 10/07/2012   Major depression 09/23/2012   Convulsions (HCC) 04/12/2012    ONSET DATE: 09/11/23   REFERRING DIAG: CVA  THERAPY DIAG:  Aphasia  Cognitive communication deficit  Rationale for Evaluation and Treatment Rehabilitation  SUBJECTIVE:   SUBJECTIVE STATEMENT: Pt alert and cooperative.  Pt accompanied by: family member and Jackolyn (wife) and daughter Rae)   PERTINENT HISTORY: Jeffery Strong is a 83 y.o. male with past medical history including Parkinson's disease, prior CVA (07/08/2018), depression, HTN who presented to [facility] on 09/11/23 with aphasia and confusion. MRI showed Multiple small infarcts in the left anterior temporal and parietal lobes and left MCA-PCA watershed distribution. Evaluated by SLP during acute  stay with diet recommendation dysphagia IDDSI L6 soft and bite sized L0 Thin Liquids. SLP noted aphasia with primarily expressive deficits, but difficulty with complex comprehension. Previously followed by Dr. Maree with baseline cognitive deficits secondary to PD (SLUMS 25/30 in 2018), difficulty remembering names, memory deficits reported since 2014.  DIAGNOSTIC FINDINGS: See above  PAIN:  Are you having pain? No   FALLS: Has patient fallen in last 6 months?  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 Aphasia quotient calculated based on results of initial visit and this week's assessment.   STANDARDIZED ASSESSMENTS:   Western Aphasia Battery- Revised  Spontaneous Speech                           Information content               2/10                                            Fluency  7/10                                          Comprehension     Yes/No questions                 24/60                                           Auditory Word Recognition  13/60                                Sequential Commands       2/80                              Repetition                             10/100                                        Naming    Object Naming                     3/60                                           Word Fluency                        0/20                                            Sentence Completion          0/10                                             Responsive Speech              1/10                                          Aphasia Quotient                  28.6/100         Pt's severity rating and Aphasia Quotient wis considered to be very severe based on score of 24.7/100 (0-25=very severe, 26-50=severe, 51-75=moderate, 76 and above is mild). Pt's presentation is most consistent with Wernicke's subtype, characterized by severely impaired but fluent verbal expression, severely impaired auditory comprehension, and severely impaired repetition. Pt's verbal expression is characterized by fluent jargon with frequent neologisms, with occasional output of overlearned phrases and stereotypic utterances  It'll be I don't need to . Perseveration noted. Patient has little awareness of errors.    PATIENT REPORTED OUTCOME MEASURES (PROM): Communicative Participation Item Bank (CPIB) Age Range: 18+   The Communicative Participation Item Bank (CPIB) is a patient-reported outcomes instrument that targets the construct of communicative participation [employer] et al., 2013). The CPIB was developed with the intent of being appropriate for community-dwelling adults in the variety of conversational situations they frequently engage in as part of life roles at home, at work, and in social and leisure situations. All items in the CPIB start with the stem, 'Does your condition interfere with..' followed by various conversational situations such as 'Making a phone call to get information,' or 'Having a conversation in a noisy place.' Respondents choose from four response categories to rate the level of interference they experience in that situation, ranging from 'Not at all,' to 'Very much.' The item bank consists of 46 items, and a paper-and-pencil 10-item disorder-generic short form is available. The CPIB scores are reported as T-scores where 50 = the mean of the calibration sample and standard deviation = 10.  Does your condition interfere with talking with people you know? VERY MUCH (value=0) Does your condition interfere  with communicating when you need to say something quickly? VERY MUCH (value=0) Does your condition interfere with talking with people you do NOT know? VERY MUCH (value=0) Does your condition interfere with communicating when you are out in the community (e.g., running errands, appointments)? VERY MUCH (value=0) Does your condition interfere with asking questions in a conversation? VERY MUCH (value=0) Does your condition interfere with communicating in a small group of people? VERY MUCH (value=0) Does your condition interfere with having a long conversation with someone you know about a book, movie, show or sports event? VERY MUCH (value=0) Does your condition interfere with giving someone DETAILED information? VERY MUCH (value=0) Does your condition interfere with getting your turn in a fast-moving conversation? VERY MUCH (value=0) Does your condition interfere with trying to persuade a friend or family member to see a different point of view? VERY MUCH (value=0)  Pt's T-Score is 24.2 which is considered 2.5 SD below mean of 50.   Explained concept of aphasia and deficit areas, as well as factors impacting prognosis such as poor awareness, prior CVA, prior memory deficits. Suggested automatic speech activities to try with patient as well as communication approach to increase comprehension (reduce distractions, ensure pt's attention to face, simple speech (single words or very short phrases), visual/gestural cues. Pt able to answer 5/6 biographical questions correctly from f=3 written choices and written key words. Rote counting completed (1-10) with max cues initially, improving to min cues. Pt with perseveration for DOW and MOY. Educated on working on Y/N with patient nodding and shaking head while responding, which seemed to improve accuracy vs pointing to words. Handout provided with basic communication strategies.    PATIENT EDUCATION: Education details: See today's treatment for details Person  educated: Patient's wife and daughter Education method: Explanation Education comprehension: verbalized understanding and needs further education  HOME EXERCISE PROGRAM:        Work on Y/N, counting   GOALS:  Goals reviewed with patient? Yes      SHORT TERM GOALS: Target date: 10 sessions  Patient will accurately respond to simple Y/N questions 80% accuracy with external supports as needed. Baseline: Goal status: INITIAL   2.  Patient will complete standardized assessment of language (and cognition, as appropriate). Baseline:  Goal status: INITIAL  3.  Patient will demonstrate awareness of communication breakdowns in 3/5 opportunities with usual moderate cues. Baseline:  Goal status: INITIAL    LONG TERM GOALS: Target date: 12/17/23  With Moderate A, patient will verify messages when written keyword with yes/no question is provided in conversation with 80% accuracy to reduce communication breakdowns. Baseline:  Goal status: INITIAL  2.  With Mod I, patient/family will demonstrate understanding of the following concepts: aphasia, spontaneous recovery, communication vs conversation, strengths/strategies to promote success, local resources by answering multiple choice questions with 90% accuracy when provided supported conversation in order to increase patient's participation in medical care. Baseline:  Goal status: INITIAL  3.  Patient will effectively communicate wants/needs via multimodal means >80% accuracy with assistance from communication partners in order to improve expressive language. Baseline:  Goal status: INITIAL    ASSESSMENT:  CLINICAL IMPRESSION: Patient is an 83 y.o. male who was seen today for aphasia treatment in setting of recent stroke.   Patient presents with severe Wernicke's aphasia impacting both expressive and receptive language. Patient has minimal functional communication at this time (no reliable Y/N) and has poor awareness of both expressive  and receptive deficits. While it is difficult to formally assess cognition given severity of his language impairment, he is also noted to have reduced sustained attention, as he was frequently distracted during testing and seemed to want to leave early (ST was also last evaluation of 3 this date). He has a supportive family who express desire to communicate with him and understand him. See details of tx session above. Patient would benefit from skilled ST to address language and cognitive impairments in order to increase ability to communicate wants and needs, follow commands, for increased safety and improved social connection with family and friends. Caregiver involvement likely necessary to make functional gains given the severity of client's deficits and his reduced awareness.  OBJECTIVE IMPAIRMENTS include attention, memory, awareness, expressive language, receptive language, and aphasia. These impairments are limiting patient from return to work, managing medications, managing appointments, managing finances, household responsibilities, ADLs/IADLs, and effectively communicating at home and in community. Factors affecting potential to achieve goals and functional outcome are ability to learn/carryover information, co-morbidities, cooperation/participation level, severity of impairments, and family/community support (+). Patient will benefit from skilled SLP services to address above impairments and improve overall function.  REHAB POTENTIAL: Good *with caregiver support  PLAN: SLP FREQUENCY: 2x/week  SLP DURATION: 12 weeks  PLANNED INTERVENTIONS: Cognitive reorganization, Internal/external aids, Functional tasks, Multimodal communication approach, SLP instruction and feedback, Compensatory strategies, Patient/family education, and 07492 Treatment of speech (30 or 45 min)    Delon Bangs, M.S., CCC-SLP Speech-Language Pathologist [facility] [phone]  (ASCOM)

## 2023-09-24 NOTE — Therapy (Addendum)
 OUTPATIENT PHYSICAL THERAPY NEURO EVALUATION   Patient Name: Jeffery Strong MRN: [id number] DOB:May 23, 1940, 83 y.o., male Today's Date: 09/24/2023   PCP: Oneil Pinal, MD REFERRING PROVIDER: Evelia Parkin, MD  END OF SESSION:  PT End of Session - 09/24/23 1443     Visit Number 2    Number of Visits 24    Date for PT Re-Evaluation 12/11/23    PT Start Time 1450    PT Stop Time 1530    PT Time Calculation (min) 40 min    Equipment Utilized During Treatment Gait belt    Activity Tolerance Patient tolerated treatment well    Behavior During Therapy WFL for tasks assessed/performed          Past Medical History:  Diagnosis Date   Anemia    B12 deficiency   BPH associated with nocturia    CAD (coronary artery disease)    GERD (gastroesophageal reflux disease)    occasionally.  not bad anymore. does not take meds   History of hiatal hernia    History of kidney stones 12/2019   history of stones and current stones   HLD (hyperlipidemia)    Hypertension    Parkinson disease (HCC)    right side per pt report   Stroke (HCC) 06/2018   slight balance issues   Past Surgical History:  Procedure Laterality Date   BACK SURGERY  2002   neck region, fusion. no metal   CARDIAC CATHETERIZATION     Cardiac Stents  2000   d/t chest pain and pressure   CHOLECYSTECTOMY     COLONOSCOPY WITH PROPOFOL  N/A 12/23/2015   Procedure: COLONOSCOPY WITH PROPOFOL ;  Surgeon: Lamar ONEIDA Holmes, MD;  Location: [facility] ENDOSCOPY;  Service: Endoscopy;  Laterality: N/A;   CYSTOSCOPY WITH LITHOLAPAXY N/A 01/08/2020   Procedure: CYSTOSCOPY WITH LITHOLAPAXY;  Surgeon: Francisca Redell BROCKS, MD;  Location: [facility] ORS;  Service: Urology;  Laterality: N/A;   CYSTOSCOPY/URETEROSCOPY/HOLMIUM LASER/STENT PLACEMENT Right 01/08/2020   Procedure: CYSTOSCOPY/URETEROSCOPY/HOLMIUM LASER/STENT PLACEMENT;  Surgeon: Francisca Redell BROCKS, MD;  Location: [facility] ORS;  Service: Urology;  Laterality: Right;   ESOPHAGOGASTRODUODENOSCOPY  (EGD) WITH PROPOFOL  N/A 12/23/2015   Procedure: ESOPHAGOGASTRODUODENOSCOPY (EGD) WITH PROPOFOL ;  Surgeon: Lamar ONEIDA Holmes, MD;  Location: [facility] ENDOSCOPY;  Service: Endoscopy;  Laterality: N/A;   EYE SURGERY Bilateral    cataracts   HOLEP-LASER ENUCLEATION OF THE PROSTATE WITH MORCELLATION N/A 01/08/2020   Procedure: HOLEP-LASER ENUCLEATION OF THE PROSTATE WITH MORCELLATION;  Surgeon: Francisca Redell BROCKS, MD;  Location: [facility] ORS;  Service: Urology;  Laterality: N/A;   Patient Active Problem List   Diagnosis Date Noted   Benign prostatic hyperplasia with lower urinary tract symptoms 02/17/2020   Benign essential hypertension 11/24/2019   History of CVA (cerebrovascular accident) 07/08/2018   Lacunar infarct, 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 06/30/2018   Left leg weakness 06/29/2018   Parkinson disease (HCC) 06/29/2018   CAD (coronary artery disease), native coronary artery 06/29/2018   HTN (hypertension) 06/29/2018   Medicare annual wellness visit, initial 07/02/2017   B12 deficiency 06/25/2016   Hyperlipidemia, mixed 06/13/2015   Dyslipidemia 10/07/2012   GAD (generalized anxiety disorder) 10/07/2012   Insomnia 10/07/2012   Major depression 09/23/2012   Convulsions (HCC) 04/12/2012    ONSET DATE: 09/11/23  REFERRING DIAG: S13.26 (ICD-10-CM) - Personal history of transient ischemic attack (TIA), and cerebral infarction without residual deficits   THERAPY DIAG:  Muscle weakness (generalized)  Other lack of coordination  Difficulty in walking, not elsewhere  classified  Unsteadiness on feet  Rationale for Evaluation and Treatment: Rehabilitation  SUBJECTIVE:                                                                                                                                                                                             SUBJECTIVE STATEMENT: Pt reports that he is feeling okay today.  States that it was a good weekend no falls. No medical updates.  No pain on this day.  Pt accompanied by: family member Wife,  daughter  PERTINENT HISTORY: Pt was admitted at [facility] on 09/11/23 for stroke-like symptoms of aphasia and R sided weakness. Pt has prior history of CVA in 2020 as well as Parkinsons. Pt's family reports that prior to most recent hospitalization pt was able to communicate fully as well as work in his yard independently. Pt's family states that pt's main concerns are with his speech as well as texting and writing. PMH includes L leg weakness, Parkinson disease, CAD, HTN, Hyperlipidemia, Acute lacunar infarct, B12 deficiency, convulsions, GAD, insomnia, major depression.  PAIN:  Are you having pain? No  PRECAUTIONS: None  RED FLAGS: None   WEIGHT BEARING RESTRICTIONS: No  FALLS: Has patient fallen in last 6 months? No  LIVING ENVIRONMENT: Lives with: lives with their family and lives with their spouse Lives in: House/apartment Stairs: Yes: External: 5 steps; can reach both Has following equipment at home: Single point cane and Walker - 2 wheeled  PLOF: Independent  PATIENT GOALS: Get back to doing yard work. Be able to text and write  OBJECTIVE:  Note: Objective measures were completed at Evaluation unless otherwise noted.  DIAGNOSTIC FINDINGS:  CT Brain w/o contrast (09/11/23):  Negative for acute large territory infarct or sizable intracranial hemorrhage.   CTA Head and Neck w/ & w/o contrast (09/11/23): Focal occlusion versus high-grade stenosis involving the inferior trunk of the left MCA artery (series 5, image 437) with suggestion of reconstitution of flow distally.  Suggestion of focal stenosis of the left P1 segment (series 5, image 450; series 6, image 65)  No hemodynamically significant arterial stenosis within the neck.    CT Perfusion (09/11/23): No core infarct. Perfusion abnormality noted in the left posterior parietal lobe suggestive of ischemia potentially in the posterior cortical vascular watershed zone.    COGNITION: Overall cognitive status: Difficulty to assess due to: Communication impairment   SENSATION: WFL  COORDINATION: Difficult to assess due to pt's cognition- noticeable more difficulty with finger-to-nose on L; heel-to-shin not attempted   POSTURE: rounded shoulders, forward head, and increased thoracic kyphosis   LOWER EXTREMITY MMT:  Unable to formally  assess in gold standard positions due to pt's cognition; most LE muscles are minimum 3/5 due to pt's ability to perform movements against gravity  MMT Right Eval Left Eval  Hip flexion    Hip extension    Hip abduction    Hip adduction    Hip internal rotation    Hip external rotation    Knee flexion    Knee extension    Ankle dorsiflexion    Ankle plantarflexion    Ankle inversion    Ankle eversion    (Blank rows = not tested)  BED MOBILITY:  Pt's family reports that he is independent with bed mobility  TRANSFERS: Sit to stand: SBA  Assistive device utilized: None     Stand to sit: SBA  Assistive device utilized: None      GAIT: Findings: Gait Characteristics: step through pattern, decreased arm swing- Right, decreased step length- Right, decreased step length- Left, decreased stride length, narrow BOS, poor foot clearance- Right, and poor foot clearance- Left, Distance walked: 40ft, Assistive device utilized:None, Level of assistance: CGA, and Comments: Pt with overall slow cadence of walking and was easily distracted by surroundings today  FUNCTIONAL TESTS:  5 times sit to stand: 24.04 seconds with UE support on armrests of green chair 10 meter walk test: 21.75 seconds = 0.46 m/s   [facility] PT Assessment - 09/24/23 0001       Berg Balance Test   Sit to Stand Able to stand  independently using hands    Standing Unsupported Able to stand safely 2 minutes    Sitting with Back Unsupported but Feet Supported on Floor or Stool Able to sit safely and securely 2 minutes    Stand to Sit Controls descent by using  hands    Transfers Able to transfer safely, definite need of hands    Standing Unsupported with Eyes Closed Able to stand 10 seconds with supervision   pt used hand ot cover eyes   Standing Unsupported with Feet Together Needs help to attain position and unable to hold for 15 seconds   unable to motor plan   From Standing, Reach Forward with Outstretched Arm Can reach forward >5 cm safely (2)    From Standing Position, Pick up Object from Floor Unable to pick up shoe, but reaches 2-5 cm (1-2) from shoe and balances independently   would not grasp object on floor   From Standing Position, Turn to Look Behind Over each Shoulder Needs supervision when turning    Turn 360 Degrees Needs close supervision or verbal cueing    Standing Unsupported, Alternately Place Feet on Step/Stool Able to complete >2 steps/needs minimal assist    Standing Unsupported, One Foot in Front Able to take small step independently and hold 30 seconds    Standing on One Leg Unable to try or needs assist to prevent fall   unable to motor plan   Total Score 29           PATIENT SURVEYS:  Stroke Impact Scale: 55/80  TREATMENT DATE: 09/24/23   6 Min Walk Test:  Instructed patient to ambulate as quickly and as safely as possible for 6 minutes using LRAD. Patient was allowed to take standing rest breaks without stopping the test, but if the patient required a sitting rest break the clock would be stopped and the test would be over.  Results: 455 feet in 5:24min using HHA to no AD with GCA for safety. Instruction for direction to navigate rehab department. Results indicate that the patient has reduced endurance with ambulation compared to age matched norms.  Age Matched Norms: 40-69 yo M: 17 F: 56, 96-79 yo M: 26 F: 471, 68-89 yo M: 417 F: 392 MDC: 58.21 meters (190.98 feet) or 50 meters (ANPTA  Core Set of Outcome Measures for Adults with Neurologic Conditions, 2018)  PT instructed pt in TUG: 30.60 sec ( >13.5 sec indicates increased fall risk)  Patient demonstrates increased fall risk as noted by score of  29/56 on Berg Balance Scale.  (<36= high risk for falls, close to 100%; 37-45 significant >80%; 46-51 moderate >50%; 52-55 lower >25%)  HEP review: Standing at bed with UE support  Squat x 12  Heel raise x 12  March x 10  Side stepping at EOB x 3 bil  Cues for proper ROM and speed of movement intermittently.   PATIENT EDUCATION: Education details: Pt educated on purpose of physical therapy for his recovery and was instructed on HEP provided below Person educated: Patient, Spouse, and Child(ren) Education method: Explanation, Demonstration, Tactile cues, Verbal cues, and Handouts Education comprehension: returned demonstration, verbal cues required, tactile cues required, and needs further education  HOME EXERCISE PROGRAM: Access Code: [email or web address]  URL: [email or web address]  Date: 09/18/2023  Prepared by: Marina  Moser  Exercises -  Leg Extension  - 1 x daily - 7 x weekly - 2 sets - 10 reps - 5 hold  - Standing March with Counter Support  - 1 x daily - 7 x weekly - 2 sets - 10 reps - 5 hold  - Heel Raises with Counter Support  - 1 x daily - 7 x weekly - 2 sets - 10 reps - 5 hold  - Mini Squat with Counter Support  - 1 x daily - 7 x weekly - 2 sets - 10 reps - 5 hold   GOALS: Goals reviewed with patient? Yes  SHORT TERM GOALS: Target date: 10/09/2023   Patient will be independent in home exercise program to improve strength/mobility for better functional independence with ADLs. Baseline: HEP provided at eval- see above Goal status: INITIAL   LONG TERM GOALS: Target date: 12/11/2023   Patient (> 25 years old) will complete five times sit to stand test in < 15 seconds indicating an increased LE strength and improved balance. Baseline: 24.04 seconds  with UE support on armrests Goal status: INITIAL  2.  Patient will increase 10 meter walk test to >0.19m/s as to improve gait speed for better community ambulation and to reduce fall risk. Baseline: 0.39m/s with no AD Goal status: INITIAL  3.  Patient will increase her Stroke Impact Scale score by >10 points for improved function and independence. Baseline: 55/80 Goal status: INITIAL  4.  Patient will demonstrate an improved Berg Balance Score of > as to demonstrate improved balance with ADLs such as sitting/standing and transfer balance and reduced fall risk. Baseline: assess at next session Goal status: INITIAL  5.  Patient will increase six minute walk test distance to >1000 for progression  to community ambulator and improve gait ability Baseline: 7/29 455 feet in 5:70min Goal status: INITIAL  6.  Patient will reduce timed up and go to <11 seconds to reduce fall risk and demonstrate improved transfer/gait ability. Baseline:7/29: 30.60sec Goal status: INITIAL  ASSESSMENT:  CLINICAL IMPRESSION: Patient is a 83 y.o. male who was seen today for physical therapy evaluation and treatment for balance and gait deficits s/p CVA on 09/11/23. PT completed balance assessment with completion of Berg 29/56. Limited slightly by aphasia and motor planning deficits, having a hard time comprehending narrow stance, SLS, or picking up object from floor. Was able to complete 5:36 min of 6 min walk test for 453ft indicating reduced access to community.  HEP review with significantly reduced perseveration's with transition to new movements.  Pt would benefit from skilled PT intervention to improve his listed deficits and allow for pt to return to PLOF and perform ADLs independently.   OBJECTIVE IMPAIRMENTS: Abnormal gait, decreased activity tolerance, decreased balance, decreased cognition, decreased coordination, decreased endurance, decreased knowledge of condition, decreased knowledge of use of DME,  decreased mobility, difficulty walking, decreased ROM, decreased strength, decreased safety awareness, hypomobility, impaired perceived functional ability, impaired flexibility, impaired vision/preception, improper body mechanics, and postural dysfunction.   ACTIVITY LIMITATIONS: carrying, lifting, bending, sitting, standing, squatting, stairs, transfers, self feeding, reach over head, locomotion level, and caring for others  PARTICIPATION LIMITATIONS: meal prep, cleaning, laundry, medication management, interpersonal relationship, driving, shopping, community activity, yard work, and church  PERSONAL FACTORS: Age, Past/current experiences, and 3+ comorbidities: L leg weakness, Parkinson disease, CAD, HTN, Hyperlipidemia, Acute lacunar infarct, B12 deficiency, convulsions, GAD, insomnia, major depression. are also affecting patient's functional outcome.   REHAB POTENTIAL: Good  CLINICAL DECISION MAKING: Evolving/moderate complexity  EVALUATION COMPLEXITY: Moderate  PLAN:  PT FREQUENCY: 2x/week  PT DURATION: 12 weeks  PLANNED INTERVENTIONS: [id number]- PT Re-evaluation, 97750- Physical Performance Testing, 97110-Therapeutic exercises, 97530- Therapeutic activity, [id number]- Neuromuscular re-education, 97535- Self Care, [id number]- Manual therapy, [id number]- Gait training, [id number]- Orthotic/Prosthetic subsequent, [phone]- Canalith repositioning, [id number]- Splinting, [id number]- Electrical stimulation (manual), [id number]- Ultrasound, [id number] (1-2 muscles), [id number] (3+ muscles)- Dry Needling, Patient/Family education, Balance training, Stair training, Taping, Joint mobilization, Spinal mobilization, Vestibular training, Visual/preceptual remediation/compensation, Cognitive remediation, DME instructions, Cryotherapy, and Moist heat  PLAN FOR NEXT SESSION:    Begin dynamic balance training Variable gait training.   Massie Dollar PT, DPT  Physical Therapist - [place]  [facility]  3:01 PM 09/24/23

## 2023-09-25 NOTE — Therapy (Signed)
 OUTPATIENT PHYSICAL THERAPY NEURO TREATMENT   Patient Name: Jeffery Strong MRN: [id number] DOB:1940/04/13, 83 y.o., male Today's Date: 09/26/2023   PCP: Oneil Pinal, MD REFERRING PROVIDER: Evelia Parkin, MD  END OF SESSION:  PT End of Session - 09/26/23 1141     Visit Number 3    Number of Visits 24    Date for PT Re-Evaluation 12/11/23    PT Start Time 1142    PT Stop Time 1223    PT Time Calculation (min) 41 min    Equipment Utilized During Treatment Gait belt    Activity Tolerance Patient tolerated treatment well    Behavior During Therapy WFL for tasks assessed/performed           Past Medical History:  Diagnosis Date   Anemia    B12 deficiency   BPH associated with nocturia    CAD (coronary artery disease)    GERD (gastroesophageal reflux disease)    occasionally.  not bad anymore. does not take meds   History of hiatal hernia    History of kidney stones 12/2019   history of stones and current stones   HLD (hyperlipidemia)    Hypertension    Parkinson disease (HCC)    right side per pt report   Stroke (HCC) 06/2018   slight balance issues   Past Surgical History:  Procedure Laterality Date   BACK SURGERY  2002   neck region, fusion. no metal   CARDIAC CATHETERIZATION     Cardiac Stents  2000   d/t chest pain and pressure   CHOLECYSTECTOMY     COLONOSCOPY WITH PROPOFOL  N/A 12/23/2015   Procedure: COLONOSCOPY WITH PROPOFOL ;  Surgeon: Lamar ONEIDA Holmes, MD;  Location: [facility] ENDOSCOPY;  Service: Endoscopy;  Laterality: N/A;   CYSTOSCOPY WITH LITHOLAPAXY N/A 01/08/2020   Procedure: CYSTOSCOPY WITH LITHOLAPAXY;  Surgeon: Francisca Redell BROCKS, MD;  Location: [facility] ORS;  Service: Urology;  Laterality: N/A;   CYSTOSCOPY/URETEROSCOPY/HOLMIUM LASER/STENT PLACEMENT Right 01/08/2020   Procedure: CYSTOSCOPY/URETEROSCOPY/HOLMIUM LASER/STENT PLACEMENT;  Surgeon: Francisca Redell BROCKS, MD;  Location: [facility] ORS;  Service: Urology;  Laterality: Right;   ESOPHAGOGASTRODUODENOSCOPY  (EGD) WITH PROPOFOL  N/A 12/23/2015   Procedure: ESOPHAGOGASTRODUODENOSCOPY (EGD) WITH PROPOFOL ;  Surgeon: Lamar ONEIDA Holmes, MD;  Location: [facility] ENDOSCOPY;  Service: Endoscopy;  Laterality: N/A;   EYE SURGERY Bilateral    cataracts   HOLEP-LASER ENUCLEATION OF THE PROSTATE WITH MORCELLATION N/A 01/08/2020   Procedure: HOLEP-LASER ENUCLEATION OF THE PROSTATE WITH MORCELLATION;  Surgeon: Francisca Redell BROCKS, MD;  Location: [facility] ORS;  Service: Urology;  Laterality: N/A;   Patient Active Problem List   Diagnosis Date Noted   Benign prostatic hyperplasia with lower urinary tract symptoms 02/17/2020   Benign essential hypertension 11/24/2019   History of CVA (cerebrovascular accident) 07/08/2018   Lacunar infarct, 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 06/30/2018   Left leg weakness 06/29/2018   Parkinson disease (HCC) 06/29/2018   CAD (coronary artery disease), native coronary artery 06/29/2018   HTN (hypertension) 06/29/2018   Medicare annual wellness visit, initial 07/02/2017   B12 deficiency 06/25/2016   Hyperlipidemia, mixed 06/13/2015   Dyslipidemia 10/07/2012   GAD (generalized anxiety disorder) 10/07/2012   Insomnia 10/07/2012   Major depression 09/23/2012   Convulsions (HCC) 04/12/2012    ONSET DATE: 09/11/23  REFERRING DIAG: S13.26 (ICD-10-CM) - Personal history of transient ischemic attack (TIA), and cerebral infarction without residual deficits   THERAPY DIAG:  Muscle weakness (generalized)  Other lack of coordination  Difficulty in walking, not  elsewhere classified  Unsteadiness on feet  Rationale for Evaluation and Treatment: Rehabilitation  SUBJECTIVE:                                                                                                                                                                                             SUBJECTIVE STATEMENT:  No new complaints this session.   Pt accompanied by: family member Wife,  daughter  PERTINENT  HISTORY: Pt was admitted at [facility] on 09/11/23 for stroke-like symptoms of aphasia and R sided weakness. Pt has prior history of CVA in 2020 as well as Parkinsons. Pt's family reports that prior to most recent hospitalization pt was able to communicate fully as well as work in his yard independently. Pt's family states that pt's main concerns are with his speech as well as texting and writing. PMH includes L leg weakness, Parkinson disease, CAD, HTN, Hyperlipidemia, Acute lacunar infarct, B12 deficiency, convulsions, GAD, insomnia, major depression.  PAIN:  Are you having pain? No  PRECAUTIONS: None  RED FLAGS: None   WEIGHT BEARING RESTRICTIONS: No  FALLS: Has patient fallen in last 6 months? No  LIVING ENVIRONMENT: Lives with: lives with their family and lives with their spouse Lives in: House/apartment Stairs: Yes: External: 5 steps; can reach both Has following equipment at home: Single point cane and Zabdi Mis - 2 wheeled  PLOF: Independent  PATIENT GOALS: Get back to doing yard work. Be able to text and write  OBJECTIVE:  Note: Objective measures were completed at Evaluation unless otherwise noted.  DIAGNOSTIC FINDINGS:  CT Brain w/o contrast (09/11/23):  Negative for acute large territory infarct or sizable intracranial hemorrhage.   CTA Head and Neck w/ & w/o contrast (09/11/23): Focal occlusion versus high-grade stenosis involving the inferior trunk of the left MCA artery (series 5, image 437) with suggestion of reconstitution of flow distally.  Suggestion of focal stenosis of the left P1 segment (series 5, image 450; series 6, image 65)  No hemodynamically significant arterial stenosis within the neck.    CT Perfusion (09/11/23): No core infarct. Perfusion abnormality noted in the left posterior parietal lobe suggestive of ischemia potentially in the posterior cortical vascular watershed zone.   COGNITION: Overall cognitive status: Difficulty to assess due to: Communication  impairment   SENSATION: WFL  COORDINATION: Difficult to assess due to pt's cognition- noticeable more difficulty with finger-to-nose on L; heel-to-shin not attempted   POSTURE: rounded shoulders, forward head, and increased thoracic kyphosis   LOWER EXTREMITY MMT:  Unable to formally assess in gold standard positions due to pt's cognition; most LE muscles are minimum 3/5 due to pt's  ability to perform movements against gravity  MMT Right Eval Left Eval  Hip flexion    Hip extension    Hip abduction    Hip adduction    Hip internal rotation    Hip external rotation    Knee flexion    Knee extension    Ankle dorsiflexion    Ankle plantarflexion    Ankle inversion    Ankle eversion    (Blank rows = not tested)  BED MOBILITY:  Pt's family reports that he is independent with bed mobility  TRANSFERS: Sit to stand: SBA  Assistive device utilized: None     Stand to sit: SBA  Assistive device utilized: None      GAIT: Findings: Gait Characteristics: step through pattern, decreased arm swing- Right, decreased step length- Right, decreased step length- Left, decreased stride length, narrow BOS, poor foot clearance- Right, and poor foot clearance- Left, Distance walked: 66ft, Assistive device utilized:None, Level of assistance: CGA, and Comments: Pt with overall slow cadence of walking and was easily distracted by surroundings today  FUNCTIONAL TESTS:  5 times sit to stand: 24.04 seconds with UE support on armrests of green chair 10 meter walk test: 21.75 seconds = 0.46 m/s   [facility] PT Assessment - 09/24/23 0001       Berg Balance Test   Sit to Stand Able to stand  independently using hands    Standing Unsupported Able to stand safely 2 minutes    Sitting with Back Unsupported but Feet Supported on Floor or Stool Able to sit safely and securely 2 minutes    Stand to Sit Controls descent by using hands    Transfers Able to transfer safely, definite need of hands    Standing  Unsupported with Eyes Closed Able to stand 10 seconds with supervision   pt used hand ot cover eyes   Standing Unsupported with Feet Together Needs help to attain position and unable to hold for 15 seconds   unable to motor plan   From Standing, Reach Forward with Outstretched Arm Can reach forward >5 cm safely (2)    From Standing Position, Pick up Object from Floor Unable to pick up shoe, but reaches 2-5 cm (1-2) from shoe and balances independently   would not grasp object on floor   From Standing Position, Turn to Look Behind Over each Shoulder Needs supervision when turning    Turn 360 Degrees Needs close supervision or verbal cueing    Standing Unsupported, Alternately Place Feet on Step/Stool Able to complete >2 steps/needs minimal assist    Standing Unsupported, One Foot in Front Able to take small step independently and hold 30 seconds    Standing on One Leg Unable to try or needs assist to prevent fall   unable to motor plan   Total Score 29           PATIENT SURVEYS:  Stroke Impact Scale: 55/80  TREATMENT DATE: 09/26/23   Sit to stand 2 x 10 - holding onto lightweight ball to keep hands from pushing on chair  Ambulation with 3# AW bilaterally 150' x 2 laps - fatigued after 2nd lap Seated LAQ 3# AW 2 x 10  Sidestepping in // bars x 4 laps D/B  Dynamic marching in // bars x 3 laps D/B Standing heel raises with 3# AW and UE support x 20  Standing hip abduction with 3# AW and UE support x 10 each LE  Standing 6 step tap with 3# AW x 10 each LE - holding onto lightweight ball to keep hands from holding onto // bars  Ambulation 3# AW bilaterally 150' x 2 laps  Lateral step overs half bolster x 8 each direction     PATIENT EDUCATION: Education details: Pt educated on purpose of physical therapy for his recovery and was instructed on HEP provided  below Person educated: Patient, Spouse, and Child(ren) Education method: Explanation, Demonstration, Tactile cues, Verbal cues, and Handouts Education comprehension: returned demonstration, verbal cues required, tactile cues required, and needs further education  HOME EXERCISE PROGRAM: Access Code: [email or web address]  URL: [email or web address]  Date: 09/18/2023  Prepared by: Marina  Moser  Exercises -  Leg Extension  - 1 x daily - 7 x weekly - 2 sets - 10 reps - 5 hold  - Standing March with Counter Support  - 1 x daily - 7 x weekly - 2 sets - 10 reps - 5 hold  - Heel Raises with Counter Support  - 1 x daily - 7 x weekly - 2 sets - 10 reps - 5 hold  - Mini Squat with Counter Support  - 1 x daily - 7 x weekly - 2 sets - 10 reps - 5 hold   GOALS: Goals reviewed with patient? Yes  SHORT TERM GOALS: Target date: 10/09/2023   Patient will be independent in home exercise program to improve strength/mobility for better functional independence with ADLs. Baseline: HEP provided at eval- see above Goal status: INITIAL   LONG TERM GOALS: Target date: 12/11/2023   Patient (> 86 years old) will complete five times sit to stand test in < 15 seconds indicating an increased LE strength and improved balance. Baseline: 24.04 seconds with UE support on armrests Goal status: INITIAL  2.  Patient will increase 10 meter walk test to >0.54m/s as to improve gait speed for better community ambulation and to reduce fall risk. Baseline: 0.52m/s with no AD Goal status: INITIAL  3.  Patient will increase her Stroke Impact Scale score by >10 points for improved function and independence. Baseline: 55/80 Goal status: INITIAL  4.  Patient will demonstrate an improved Berg Balance Score of > as to demonstrate improved balance with ADLs such as sitting/standing and transfer balance and reduced fall risk. Baseline: 7/29: 29/56 Goal status: INITIAL  5.  Patient will increase six minute walk test distance  to >1000 for progression to community ambulator and improve gait ability Baseline: 7/29: 455 feet in 5:73min Goal status: INITIAL  6.  Patient will reduce timed up and go to <11 seconds to reduce fall risk and demonstrate improved transfer/gait ability. Baseline:7/29: 30.60sec Goal status: INITIAL  ASSESSMENT:  CLINICAL IMPRESSION:   Patient arrives to treatment session with wife and daughter. Session focused on functional strengthening with resistance. Requires frequent step by step cueing and benefits from visual demonstration and cueing. Tolerated session well with no excessive signs of fatigue. Pt would benefit from skilled PT  intervention to improve his listed deficits and allow for pt to return to PLOF and perform ADLs independently.   OBJECTIVE IMPAIRMENTS: Abnormal gait, decreased activity tolerance, decreased balance, decreased cognition, decreased coordination, decreased endurance, decreased knowledge of condition, decreased knowledge of use of DME, decreased mobility, difficulty walking, decreased ROM, decreased strength, decreased safety awareness, hypomobility, impaired perceived functional ability, impaired flexibility, impaired vision/preception, improper body mechanics, and postural dysfunction.   ACTIVITY LIMITATIONS: carrying, lifting, bending, sitting, standing, squatting, stairs, transfers, self feeding, reach over head, locomotion level, and caring for others  PARTICIPATION LIMITATIONS: meal prep, cleaning, laundry, medication management, interpersonal relationship, driving, shopping, community activity, yard work, and church  PERSONAL FACTORS: Age, Past/current experiences, and 3+ comorbidities: L leg weakness, Parkinson disease, CAD, HTN, Hyperlipidemia, Acute lacunar infarct, B12 deficiency, convulsions, GAD, insomnia, major depression. are also affecting patient's functional outcome.   REHAB POTENTIAL: Good  CLINICAL DECISION MAKING: Evolving/moderate  complexity  EVALUATION COMPLEXITY: Moderate  PLAN:  PT FREQUENCY: 2x/week  PT DURATION: 12 weeks  PLANNED INTERVENTIONS: [id number]- PT Re-evaluation, 97750- Physical Performance Testing, 97110-Therapeutic exercises, 97530- Therapeutic activity, [id number]- Neuromuscular re-education, 97535- Self Care, [id number]- Manual therapy, [id number]- Gait training, [id number]- Orthotic/Prosthetic subsequent, [phone]- Canalith repositioning, [id number]- Splinting, [id number]- Electrical stimulation (manual), [id number]- Ultrasound, [id number] (1-2 muscles), [id number] (3+ muscles)- Dry Needling, Patient/Family education, Balance training, Stair training, Taping, Joint mobilization, Spinal mobilization, Vestibular training, Visual/preceptual remediation/compensation, Cognitive remediation, DME instructions, Cryotherapy, and Moist heat  PLAN FOR NEXT SESSION:    Begin dynamic balance training Variable gait training.    Maryanne Finder, PT, DPT Physical Therapist - [facility] 11:42 AM 09/26/23

## 2023-09-26 ENCOUNTER — Ambulatory Visit

## 2023-09-26 DIAGNOSIS — M6281 Muscle weakness (generalized): Secondary | ICD-10-CM

## 2023-09-26 DIAGNOSIS — R278 Other lack of coordination: Secondary | ICD-10-CM

## 2023-09-26 DIAGNOSIS — R2681 Unsteadiness on feet: Secondary | ICD-10-CM

## 2023-09-26 DIAGNOSIS — R262 Difficulty in walking, not elsewhere classified: Secondary | ICD-10-CM

## 2023-10-01 ENCOUNTER — Ambulatory Visit

## 2023-10-01 ENCOUNTER — Encounter

## 2023-10-01 ENCOUNTER — Ambulatory Visit: Attending: Internal Medicine

## 2023-10-01 DIAGNOSIS — R4701 Aphasia: Secondary | ICD-10-CM | POA: Diagnosis present

## 2023-10-01 DIAGNOSIS — R41841 Cognitive communication deficit: Secondary | ICD-10-CM | POA: Insufficient documentation

## 2023-10-01 DIAGNOSIS — M6281 Muscle weakness (generalized): Secondary | ICD-10-CM | POA: Insufficient documentation

## 2023-10-01 DIAGNOSIS — R278 Other lack of coordination: Secondary | ICD-10-CM

## 2023-10-01 DIAGNOSIS — R262 Difficulty in walking, not elsewhere classified: Secondary | ICD-10-CM | POA: Insufficient documentation

## 2023-10-01 DIAGNOSIS — R2681 Unsteadiness on feet: Secondary | ICD-10-CM | POA: Insufficient documentation

## 2023-10-01 NOTE — Therapy (Signed)
 OUTPATIENT PHYSICAL THERAPY TREATMENT   Patient Name: Jeffery Strong MRN: [id number] DOB:Sep 07, 1940, 83 y.o., male Today's Date: 10/01/2023   PCP: Oneil Pinal, MD REFERRING PROVIDER: Evelia Parkin, MD  END OF SESSION:  PT End of Session - 10/01/23 1627     Visit Number 4    Number of Visits 24    Date for PT Re-Evaluation 12/11/23    Authorization Type [employer] Medicare    Progress Note Due on Visit 10    PT Start Time 1615    PT Stop Time 1655    PT Time Calculation (min) 40 min    Equipment Utilized During Treatment Gait belt    Activity Tolerance Patient tolerated treatment well;No increased pain    Behavior During Therapy WFL for tasks assessed/performed           Past Medical History:  Diagnosis Date   Anemia    B12 deficiency   BPH associated with nocturia    CAD (coronary artery disease)    GERD (gastroesophageal reflux disease)    occasionally.  not bad anymore. does not take meds   History of hiatal hernia    History of kidney stones 12/2019   history of stones and current stones   HLD (hyperlipidemia)    Hypertension    Parkinson disease (HCC)    right side per pt report   Stroke (HCC) 06/2018   slight balance issues   Past Surgical History:  Procedure Laterality Date   BACK SURGERY  2002   neck region, fusion. no metal   CARDIAC CATHETERIZATION     Cardiac Stents  2000   d/t chest pain and pressure   CHOLECYSTECTOMY     COLONOSCOPY WITH PROPOFOL  N/A 12/23/2015   Procedure: COLONOSCOPY WITH PROPOFOL ;  Surgeon: Lamar ONEIDA Holmes, MD;  Location: [facility] ENDOSCOPY;  Service: Endoscopy;  Laterality: N/A;   CYSTOSCOPY WITH LITHOLAPAXY N/A 01/08/2020   Procedure: CYSTOSCOPY WITH LITHOLAPAXY;  Surgeon: Francisca Redell BROCKS, MD;  Location: [facility] ORS;  Service: Urology;  Laterality: N/A;   CYSTOSCOPY/URETEROSCOPY/HOLMIUM LASER/STENT PLACEMENT Right 01/08/2020   Procedure: CYSTOSCOPY/URETEROSCOPY/HOLMIUM LASER/STENT PLACEMENT;  Surgeon: Francisca Redell BROCKS, MD;   Location: [facility] ORS;  Service: Urology;  Laterality: Right;   ESOPHAGOGASTRODUODENOSCOPY (EGD) WITH PROPOFOL  N/A 12/23/2015   Procedure: ESOPHAGOGASTRODUODENOSCOPY (EGD) WITH PROPOFOL ;  Surgeon: Lamar ONEIDA Holmes, MD;  Location: [facility] ENDOSCOPY;  Service: Endoscopy;  Laterality: N/A;   EYE SURGERY Bilateral    cataracts   HOLEP-LASER ENUCLEATION OF THE PROSTATE WITH MORCELLATION N/A 01/08/2020   Procedure: HOLEP-LASER ENUCLEATION OF THE PROSTATE WITH MORCELLATION;  Surgeon: Francisca Redell BROCKS, MD;  Location: [facility] ORS;  Service: Urology;  Laterality: N/A;   Patient Active Problem List   Diagnosis Date Noted   Benign prostatic hyperplasia with lower urinary tract symptoms 02/17/2020   Benign essential hypertension 11/24/2019   History of CVA (cerebrovascular accident) 07/08/2018   Lacunar infarct, 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 06/30/2018   Left leg weakness 06/29/2018   Parkinson disease (HCC) 06/29/2018   CAD (coronary artery disease), native coronary artery 06/29/2018   HTN (hypertension) 06/29/2018   Medicare annual wellness visit, initial 07/02/2017   B12 deficiency 06/25/2016   Hyperlipidemia, mixed 06/13/2015   Dyslipidemia 10/07/2012   GAD (generalized anxiety disorder) 10/07/2012   Insomnia 10/07/2012   Major depression 09/23/2012   Convulsions (HCC) 04/12/2012    ONSET DATE: 09/11/23  REFERRING DIAG: S13.26 (ICD-10-CM) - Personal history of transient ischemic attack (TIA), and cerebral infarction without residual deficits  THERAPY DIAG:  Muscle weakness (generalized)  Other lack of coordination  Difficulty in walking, not elsewhere classified  Unsteadiness on feet  Rationale for Evaluation and Treatment: Rehabilitation  SUBJECTIVE:                                                                                                                                                                                             SUBJECTIVE STATEMENT:  No new  complaints this session. Barnie Ester DTR and wife both remport no new falls, has been mobile in the house with fair independence.   Pt accompanied by: family member Wife,  daughter  PERTINENT HISTORY: Pt was admitted at [facility] on 09/11/23 for stroke-like symptoms of aphasia and R sided weakness. Pt has prior history of CVA in 2020 as well as Parkinsons. Pt's family reports that prior to most recent hospitalization pt was able to communicate fully as well as work in his yard independently. Pt's family states that pt's main concerns are with his speech as well as texting and writing. PMH includes L leg weakness, Parkinson disease, CAD, HTN, Hyperlipidemia, Acute lacunar infarct, B12 deficiency, convulsions, GAD, insomnia, major depression.  PAIN:  Are you having pain? No  PRECAUTIONS: None  RED FLAGS: None   WEIGHT BEARING RESTRICTIONS: No  FALLS: Has patient fallen in last 6 months? No  LIVING ENVIRONMENT: Lives with: lives with their family and lives with their spouse Lives in: House/apartment Stairs: Yes: External: 5 steps; can reach both Has following equipment at home: Single point cane and Walker - 2 wheeled  PLOF: Independent  PATIENT GOALS: Get back to doing yard work. Be able to text and write  OBJECTIVE:  Note: Objective measures were completed at Evaluation unless otherwise noted.  DIAGNOSTIC FINDINGS:  CT Brain w/o contrast (09/11/23):  Negative for acute large territory infarct or sizable intracranial hemorrhage.   CTA Head and Neck w/ & w/o contrast (09/11/23): Focal occlusion versus high-grade stenosis involving the inferior trunk of the left MCA artery (series 5, image 437) with suggestion of reconstitution of flow distally.  Suggestion of focal stenosis of the left P1 segment (series 5, image 450; series 6, image 65)  No hemodynamically significant arterial stenosis within the neck.    CT Perfusion (09/11/23): No core infarct. Perfusion abnormality noted in the left  posterior parietal lobe suggestive of ischemia potentially in the posterior cortical vascular watershed zone.  TREATMENT DATE: 10/01/23  -AMB overground 929ft, no device, no LOB, visual cues for navigation 2 laps on LL (71m6sec)  -STS x10 with orange 5kg ball + overhead press BP assessment: 143/85 68 bpm  -STS x10 with orange 5kg ball + overhead press  -AMB agility course over 30ft c 3 plank walk, red soft mat walk, and 8 airex step forward up and down: pt carries 5kg ball, blue kg ball, yellow kg ball, 9lb AW, 3lb free weight, 15lb KB; 1 LOB/misstep on airex pad, which her corrects with a step-righting   -airex pad stance basketball toss/catch to [employer] (12' distance), excellent reaction timing, no frank LOB - 261ft load carry with 2 hands, then alternate forward/backward AMB with load x52ft    PATIENT EDUCATION: Education details: Pt educated on purpose of physical therapy for his recovery and was instructed on HEP provided below Person educated: Patient, Spouse, and Child(ren) Education method: Explanation, Demonstration, Tactile cues, Verbal cues, and Handouts Education comprehension: returned demonstration, verbal cues required, tactile cues required, and needs further education  HOME EXERCISE PROGRAM: Access Code: [email or web address]  URL: [email or web address]  Date: 09/18/2023  Prepared by: Marina  Moser  Exercises -  Leg Extension  - 1 x daily - 7 x weekly - 2 sets - 10 reps - 5 hold  - Standing March with Counter Support  - 1 x daily - 7 x weekly - 2 sets - 10 reps - 5 hold  - Heel Raises with Counter Support  - 1 x daily - 7 x weekly - 2 sets - 10 reps - 5 hold  - Mini Squat with Counter Support  - 1 x daily - 7 x weekly - 2 sets - 10 reps - 5 hold   GOALS: Goals reviewed with patient? Yes  SHORT TERM GOALS: Target date:  10/09/2023  Patient will be independent in home exercise program to improve strength/mobility for better functional independence with ADLs. Baseline: HEP provided at eval- see above Goal status: INITIAL  LONG TERM GOALS: Target date: 12/11/2023  Patient (> 53 years old) will complete five times sit to stand test in < 15 seconds indicating an increased LE strength and improved balance. Baseline: 24.04 seconds with UE support on armrests Goal status: INITIAL  2.  Patient will increase 10 meter walk test to >0.10m/s as to improve gait speed for better community ambulation and to reduce fall risk. Baseline: 0.61m/s with no AD Goal status: INITIAL  3.  Patient will increase her Stroke Impact Scale score by >10 points for improved function and independence. Baseline: 55/80 Goal status: INITIAL  4.  Patient will demonstrate an improved Berg Balance Score of > as to demonstrate improved balance with ADLs such as sitting/standing and transfer balance and reduced fall risk. Baseline: 7/29: 29/56 Goal status: INITIAL  5.  Patient will increase six minute walk test distance to >1000 for progression to community ambulator and improve gait ability Baseline: 7/29: 455 feet in 5:61min Goal status: INITIAL  6.  Patient will reduce timed up and go to <11 seconds to reduce fall risk and demonstrate improved transfer/gait ability. Baseline:7/29: 30.60sec Goal status: INITIAL  ASSESSMENT:  CLINICAL IMPRESSION:   Began integrating some longer distance walking, as well as variable task walking to simulate garden/yard activities and balance. Pt has few LOB in session, all of which he is able to correct without any physical assist from author, however close CGA remains in place given his language deficits and limited ability to communicate his symptoms  or response to activity. Pt would benefit from skilled PT intervention to improve his listed deficits and allow for pt to return to PLOF and perform ADLs  independently.   OBJECTIVE IMPAIRMENTS: Abnormal gait, decreased activity tolerance, decreased balance, decreased cognition, decreased coordination, decreased endurance, decreased knowledge of condition, decreased knowledge of use of DME, decreased mobility, difficulty walking, decreased ROM, decreased strength, decreased safety awareness, hypomobility, impaired perceived functional ability, impaired flexibility, impaired vision/preception, improper body mechanics, and postural dysfunction.   ACTIVITY LIMITATIONS: carrying, lifting, bending, sitting, standing, squatting, stairs, transfers, self feeding, reach over head, locomotion level, and caring for others  PARTICIPATION LIMITATIONS: meal prep, cleaning, laundry, medication management, interpersonal relationship, driving, shopping, community activity, yard work, and church  PERSONAL FACTORS: Age, Past/current experiences, and 3+ comorbidities: L leg weakness, Parkinson disease, CAD, HTN, Hyperlipidemia, Acute lacunar infarct, B12 deficiency, convulsions, GAD, insomnia, major depression. are also affecting patient's functional outcome.   REHAB POTENTIAL: Good  CLINICAL DECISION MAKING: Evolving/moderate complexity  EVALUATION COMPLEXITY: Moderate  PLAN:  PT FREQUENCY: 2x/week  PT DURATION: 12 weeks  PLANNED INTERVENTIONS: [id number]- PT Re-evaluation, 97750- Physical Performance Testing, 97110-Therapeutic exercises, 97530- Therapeutic activity, [id number]- Neuromuscular re-education, 97535- Self Care, [id number]- Manual therapy, [id number]- Gait training, [phone]- Orthotic/Prosthetic subsequent, [id number]- Canalith repositioning, [id number]- Splinting, [id number]- Electrical stimulation (manual), [id number]- Ultrasound, 79439 (1-2 muscles), 20561 (3+ muscles)- Dry Needling, Patient/Family education, Balance training, Stair training, Taping, Joint mobilization, Spinal mobilization, Vestibular training, Visual/preceptual remediation/compensation, Cognitive remediation, DME  instructions, Cryotherapy, and Moist heat  PLAN FOR NEXT SESSION:  dynamic balance training, variable gait training.    5:06 PM, 10/01/23 Peggye JAYSON Linear, PT, DPT Physical Therapist - [facility]  Outpatient Physical Therapy- Main Campus [phone]

## 2023-10-01 NOTE — Therapy (Signed)
 OUTPATIENT SPEECH LANGUAGE PATHOLOGY  TREATMENT   Patient Name: Jeffery Strong MRN: [id number] DOB:[birth year], 83 y.o., male Today's Date: 10/01/2023  PCP: Jeffery Oneil FALCON, MD  REFERRING PROVIDER: Sheena Pod MD    End of Session - 10/01/23 1529     Visit Number 3    Number of Visits 25    Date for SLP Re-Evaluation 12/17/23    SLP Start Time 1530    SLP Stop Time  1615    SLP Time Calculation (min) 45 min    Activity Tolerance Patient tolerated treatment well           Patient Active Problem List   Diagnosis Date Noted   Benign prostatic hyperplasia with lower urinary tract symptoms 02/17/2020   Benign essential hypertension 11/24/2019   History of CVA (cerebrovascular accident) 07/08/2018   Lacunar infarct, 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 06/30/2018   Left leg weakness 06/29/2018   Parkinson disease (HCC) 06/29/2018   CAD (coronary artery disease), native coronary artery 06/29/2018   HTN (hypertension) 06/29/2018   Medicare annual wellness visit, initial 07/02/2017   B12 deficiency 06/25/2016   Hyperlipidemia, mixed 06/13/2015   Dyslipidemia 10/07/2012   GAD (generalized anxiety disorder) 10/07/2012   Insomnia 10/07/2012   Major depression 09/23/2012   Convulsions (HCC) 04/12/2012    ONSET DATE: 09/11/23   REFERRING DIAG: CVA  THERAPY DIAG:  Aphasia  Cognitive communication deficit  Rationale for Evaluation and Treatment Rehabilitation  SUBJECTIVE:   SUBJECTIVE STATEMENT: Pt alert and cooperative.  Pt accompanied by: family member and Jeffery Strong (wife) and daughter Jeffery Strong)   PERTINENT HISTORY: Jeffery Strong is a 83 y.o. male with past medical history including Parkinson's disease, prior CVA (07/08/2018), depression, HTN who presented to [facility] on 09/11/23 with aphasia and confusion. MRI showed Multiple small infarcts in the left anterior temporal and parietal lobes and left MCA-PCA watershed distribution. Evaluated by SLP during acute  stay with diet recommendation dysphagia IDDSI L6 soft and bite sized L0 Thin Liquids. SLP noted aphasia with primarily expressive deficits, but difficulty with complex comprehension. Previously followed by Dr. Maree with baseline cognitive deficits secondary to PD (SLUMS 25/30 in 2018), difficulty remembering names, memory deficits reported since 2014.  DIAGNOSTIC FINDINGS: See above  PAIN:  Are you having pain? No   FALLS: Has patient fallen in last 6 months?  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 Reviewed automatic speech activities to try with patient as well as communication approach to increase comprehension (reduce distractions, ensure pt's attention to face, simple speech (single words or very short phrases), visual/gestural cues. Pt able to select pictured object from f=3 choices with written cue 4/10 and with written cue paired with gestural cue 6/10. When shown pictured object, pt able to select correct written object from f=3 woth 50% accuracy, improving to 80% with paired gestural cueing.  Rote counting completed (1-10) with max cues initially, improving to min cues. Pt with perseveration for DOW and MOY. Educated on working on Y/N  with patient nodding and shaking head while responding, which seemed to improve accuracy vs pointing to words.    PATIENT EDUCATION: Education details: See today's treatment for details Person educated: Patient's wife and daughter Education method:  Explanation Education comprehension: verbalized understanding and needs further education  HOME EXERCISE PROGRAM:        Work on Y/N, counting, written cues/total communication   GOALS:  Goals reviewed with patient? Yes      SHORT TERM GOALS: Target date: 10 sessions  Patient will accurately respond to simple Y/N questions 80% accuracy with external supports as needed. Baseline: Goal status: INITIAL   2.  Patient will complete standardized assessment of language (and cognition, as appropriate). Baseline:  Goal status: INITIAL  3.  Patient will demonstrate awareness of communication breakdowns in 3/5 opportunities with usual moderate cues. Baseline:  Goal status: INITIAL    LONG TERM GOALS: Target date: 12/17/23  With Moderate A, patient will verify messages when written keyword with yes/no question is provided in conversation with 80% accuracy to reduce communication breakdowns. Baseline:  Goal status: INITIAL  2.  With Mod I, patient/family will demonstrate understanding of the following concepts: aphasia, spontaneous recovery, communication vs conversation, strengths/strategies to promote success, local resources by answering multiple choice questions with 90% accuracy when provided supported conversation in order to increase patient's participation in medical care. Baseline:  Goal status: INITIAL  3.  Patient will effectively communicate wants/needs via multimodal means >80% accuracy with assistance from communication partners in order to improve expressive language. Baseline:  Goal status: INITIAL    ASSESSMENT:  CLINICAL IMPRESSION: Patient is an 83 y.o. male who was seen today for aphasia treatment in setting of recent stroke.   Patient presents with severe Wernicke's aphasia impacting both expressive and receptive language. Patient has minimal functional communication at this time (no reliable Y/N) and has poor awareness of both expressive and receptive  deficits. While it is difficult to formally assess cognition given severity of his language impairment, he is also noted to have reduced sustained attention, as he was frequently distracted during testing and seemed to want to leave early (ST was also last evaluation of 3 this date). He has a supportive family who express desire to communicate with him and understand him. See details of tx session above. Patient would benefit from skilled ST to address language and cognitive impairments in order to increase ability to communicate wants and needs, follow commands, for increased safety and improved social connection with family and friends. Caregiver involvement likely necessary to make functional gains given the severity of client's deficits and his reduced awareness.  OBJECTIVE IMPAIRMENTS include attention, memory, awareness, expressive language, receptive language, and aphasia. These impairments are limiting patient from return to work, managing medications, managing appointments, managing finances, household responsibilities, ADLs/IADLs, and effectively communicating at home and in community. Factors affecting potential to achieve goals and functional outcome are ability to learn/carryover information, co-morbidities, cooperation/participation level, severity of impairments, and family/community support (+). Patient will benefit from skilled SLP services to address above impairments and improve overall function.  REHAB POTENTIAL: Good *with caregiver support  PLAN: SLP FREQUENCY: 2x/week  SLP DURATION: 12 weeks  PLANNED INTERVENTIONS: Cognitive reorganization, Internal/external aids, Functional tasks, Multimodal communication approach, SLP instruction and feedback, Compensatory strategies, Patient/family education, and [id number] Treatment of speech (30 or 45 min)    Delon Bangs, M.S., CCC-SLP Speech-Language Pathologist [place] [facility] ([facility]) [phone]  (ASCOM)

## 2023-10-03 ENCOUNTER — Ambulatory Visit

## 2023-10-03 ENCOUNTER — Encounter

## 2023-10-03 DIAGNOSIS — R278 Other lack of coordination: Secondary | ICD-10-CM

## 2023-10-03 DIAGNOSIS — R2681 Unsteadiness on feet: Secondary | ICD-10-CM

## 2023-10-03 DIAGNOSIS — R4701 Aphasia: Secondary | ICD-10-CM

## 2023-10-03 DIAGNOSIS — R41841 Cognitive communication deficit: Secondary | ICD-10-CM

## 2023-10-03 DIAGNOSIS — M6281 Muscle weakness (generalized): Secondary | ICD-10-CM

## 2023-10-03 DIAGNOSIS — R262 Difficulty in walking, not elsewhere classified: Secondary | ICD-10-CM

## 2023-10-03 NOTE — Therapy (Signed)
 OUTPATIENT PHYSICAL THERAPY TREATMENT   Patient Name: Jeffery Strong MRN: [id number] DOB:09-20-1940, 83 y.o., male Today's Date: 10/04/2023   PCP: Oneil Pinal, MD REFERRING PROVIDER: Evelia Parkin, MD  END OF SESSION:  PT End of Session - 10/03/23 1619     Visit Number 5    Number of Visits 24    Date for PT Re-Evaluation 12/11/23    Authorization Type [employer] Medicare    Progress Note Due on Visit 10    PT Start Time 1615    PT Stop Time 1653    PT Time Calculation (min) 38 min    Equipment Utilized During Treatment Gait belt    Activity Tolerance Patient tolerated treatment well;No increased pain    Behavior During Therapy WFL for tasks assessed/performed           Past Medical History:  Diagnosis Date   Anemia    B12 deficiency   BPH associated with nocturia    CAD (coronary artery disease)    GERD (gastroesophageal reflux disease)    occasionally.  not bad anymore. does not take meds   History of hiatal hernia    History of kidney stones 12/2019   history of stones and current stones   HLD (hyperlipidemia)    Hypertension    Parkinson disease (HCC)    right side per pt report   Stroke (HCC) 06/2018   slight balance issues   Past Surgical History:  Procedure Laterality Date   BACK SURGERY  2002   neck region, fusion. no metal   CARDIAC CATHETERIZATION     Cardiac Stents  2000   d/t chest pain and pressure   CHOLECYSTECTOMY     COLONOSCOPY WITH PROPOFOL  N/A 12/23/2015   Procedure: COLONOSCOPY WITH PROPOFOL ;  Surgeon: Lamar ONEIDA Holmes, MD;  Location: [facility] ENDOSCOPY;  Service: Endoscopy;  Laterality: N/A;   CYSTOSCOPY WITH LITHOLAPAXY N/A 01/08/2020   Procedure: CYSTOSCOPY WITH LITHOLAPAXY;  Surgeon: Francisca Redell BROCKS, MD;  Location: [facility] ORS;  Service: Urology;  Laterality: N/A;   CYSTOSCOPY/URETEROSCOPY/HOLMIUM LASER/STENT PLACEMENT Right 01/08/2020   Procedure: CYSTOSCOPY/URETEROSCOPY/HOLMIUM LASER/STENT PLACEMENT;  Surgeon: Francisca Redell BROCKS, MD;   Location: [facility] ORS;  Service: Urology;  Laterality: Right;   ESOPHAGOGASTRODUODENOSCOPY (EGD) WITH PROPOFOL  N/A 12/23/2015   Procedure: ESOPHAGOGASTRODUODENOSCOPY (EGD) WITH PROPOFOL ;  Surgeon: Lamar ONEIDA Holmes, MD;  Location: [facility] ENDOSCOPY;  Service: Endoscopy;  Laterality: N/A;   EYE SURGERY Bilateral    cataracts   HOLEP-LASER ENUCLEATION OF THE PROSTATE WITH MORCELLATION N/A 01/08/2020   Procedure: HOLEP-LASER ENUCLEATION OF THE PROSTATE WITH MORCELLATION;  Surgeon: Francisca Redell BROCKS, MD;  Location: [facility] ORS;  Service: Urology;  Laterality: N/A;   Patient Active Problem List   Diagnosis Date Noted   Benign prostatic hyperplasia with lower urinary tract symptoms 02/17/2020   Benign essential hypertension 11/24/2019   History of CVA (cerebrovascular accident) 07/08/2018   Lacunar infarct, 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 06/30/2018   Left leg weakness 06/29/2018   Parkinson disease (HCC) 06/29/2018   CAD (coronary artery disease), native coronary artery 06/29/2018   HTN (hypertension) 06/29/2018   Medicare annual wellness visit, initial 07/02/2017   B12 deficiency 06/25/2016   Hyperlipidemia, mixed 06/13/2015   Dyslipidemia 10/07/2012   GAD (generalized anxiety disorder) 10/07/2012   Insomnia 10/07/2012   Major depression 09/23/2012   Convulsions (HCC) 04/12/2012    ONSET DATE: 09/11/23  REFERRING DIAG: S13.26 (ICD-10-CM) - Personal history of transient ischemic attack (TIA), and cerebral infarction without residual deficits  THERAPY DIAG:  Muscle weakness (generalized)  Other lack of coordination  Difficulty in walking, not elsewhere classified  Unsteadiness on feet  Rationale for Evaluation and Treatment: Rehabilitation  SUBJECTIVE:                                                                                                                                                                                             SUBJECTIVE STATEMENT:  Family report he  is doing well overall from a mobility standpoint- going out daily to front porch and moving around in the home well without any falls. Patient was speaking upon arrival yet speaking unintelligibly.   Pt accompanied by: family member Wife,  daughter  PERTINENT HISTORY: Pt was admitted at [facility] on 09/11/23 for stroke-like symptoms of aphasia and R sided weakness. Pt has prior history of CVA in 2020 as well as Parkinsons. Pt's family reports that prior to most recent hospitalization pt was able to communicate fully as well as work in his yard independently. Pt's family states that pt's main concerns are with his speech as well as texting and writing. PMH includes L leg weakness, Parkinson disease, CAD, HTN, Hyperlipidemia, Acute lacunar infarct, B12 deficiency, convulsions, GAD, insomnia, major depression.  PAIN:  Are you having pain? No  PRECAUTIONS: None  RED FLAGS: None   WEIGHT BEARING RESTRICTIONS: No  FALLS: Has patient fallen in last 6 months? No  LIVING ENVIRONMENT: Lives with: lives with their family and lives with their spouse Lives in: House/apartment Stairs: Yes: External: 5 steps; can reach both Has following equipment at home: Single point cane and Walker - 2 wheeled  PLOF: Independent  PATIENT GOALS: Get back to doing yard work. Be able to text and write  OBJECTIVE:  Note: Objective measures were completed at Evaluation unless otherwise noted.  DIAGNOSTIC FINDINGS:  CT Brain w/o contrast (09/11/23):  Negative for acute large territory infarct or sizable intracranial hemorrhage.   CTA Head and Neck w/ & w/o contrast (09/11/23): Focal occlusion versus high-grade stenosis involving the inferior trunk of the left MCA artery (series 5, image 437) with suggestion of reconstitution of flow distally.  Suggestion of focal stenosis of the left P1 segment (series 5, image 450; series 6, image 65)  No hemodynamically significant arterial stenosis within the neck.    CT Perfusion  (09/11/23): No core infarct. Perfusion abnormality noted in the left posterior parietal lobe suggestive of ischemia potentially in the posterior cortical vascular watershed zone.  TREATMENT DATE: 10/04/23   -Step tap  initially with BUE support as patient demonstrated some difficulty following commands (both verbal and visual to not use his hands) x20 reps then progressed to no UE support x 20 reps alt LE  -Step up without UE support x 20 reps ea LE  Side stepping in // bars - down and back x 5 (VC and visual demo to widen steps and no UE Support)    - 80 ft farmers carry with 15# KB each hand x 4   - alternate forward/backward AMB  in // bars - down and back x 5 Focusing on increasing step length and no LOB.   - Fwd step over 1/2 foam roll x 4 in // bars - down and back x8 without UE support.  -Sit to stand without UE support x 10    PATIENT EDUCATION: Education details: Pt educated on purpose of physical therapy for his recovery and was instructed on HEP provided below Person educated: Patient, Spouse, and Child(ren) Education method: Explanation, Demonstration, Tactile cues, Verbal cues, and Handouts Education comprehension: returned demonstration, verbal cues required, tactile cues required, and needs further education  HOME EXERCISE PROGRAM: Access Code: [email or web address]  URL: [email or web address]  Date: 09/18/2023  Prepared by: Marina  Moser  Exercises -  Leg Extension  - 1 x daily - 7 x weekly - 2 sets - 10 reps - 5 hold  - Standing March with Counter Support  - 1 x daily - 7 x weekly - 2 sets - 10 reps - 5 hold  - Heel Raises with Counter Support  - 1 x daily - 7 x weekly - 2 sets - 10 reps - 5 hold  - Mini Squat with Counter Support  - 1 x daily - 7 x weekly - 2 sets - 10 reps - 5 hold   GOALS: Goals reviewed with patient? Yes  SHORT TERM  GOALS: Target date: 10/09/2023  Patient will be independent in home exercise program to improve strength/mobility for better functional independence with ADLs. Baseline: HEP provided at eval- see above Goal status: INITIAL  LONG TERM GOALS: Target date: 12/11/2023  Patient (> 30 years old) will complete five times sit to stand test in < 15 seconds indicating an increased LE strength and improved balance. Baseline: 24.04 seconds with UE support on armrests Goal status: INITIAL  2.  Patient will increase 10 meter walk test to >0.12m/s as to improve gait speed for better community ambulation and to reduce fall risk. Baseline: 0.69m/s with no AD Goal status: INITIAL  3.  Patient will increase her Stroke Impact Scale score by >10 points for improved function and independence. Baseline: 55/80 Goal status: INITIAL  4.  Patient will demonstrate an improved Berg Balance Score of > as to demonstrate improved balance with ADLs such as sitting/standing and transfer balance and reduced fall risk. Baseline: 7/29: 29/56 Goal status: INITIAL  5.  Patient will increase six minute walk test distance to >1000 for progression to community ambulator and improve gait ability Baseline: 7/29: 455 feet in 5:25min Goal status: INITIAL  6.  Patient will reduce timed up and go to <11 seconds to reduce fall risk and demonstrate improved transfer/gait ability. Baseline:7/29: 30.60sec Goal status: INITIAL  ASSESSMENT:  CLINICAL IMPRESSION:  Pt continues to require extensive verbal, visual, and tactile cueing throughout session to perform tasks  CGA remains in place given his language deficits and limited ability to communicate his symptoms or response to activity. He is able  to move well overall- just difficulty comprehending instructions- but once he practices- shows promise and able to work more on his overall balance.  Pt would benefit from skilled PT intervention to improve his listed deficits and allow for pt  to return to PLOF and perform ADLs independently.   OBJECTIVE IMPAIRMENTS: Abnormal gait, decreased activity tolerance, decreased balance, decreased cognition, decreased coordination, decreased endurance, decreased knowledge of condition, decreased knowledge of use of DME, decreased mobility, difficulty walking, decreased ROM, decreased strength, decreased safety awareness, hypomobility, impaired perceived functional ability, impaired flexibility, impaired vision/preception, improper body mechanics, and postural dysfunction.   ACTIVITY LIMITATIONS: carrying, lifting, bending, sitting, standing, squatting, stairs, transfers, self feeding, reach over head, locomotion level, and caring for others  PARTICIPATION LIMITATIONS: meal prep, cleaning, laundry, medication management, interpersonal relationship, driving, shopping, community activity, yard work, and church  PERSONAL FACTORS: Age, Past/current experiences, and 3+ comorbidities: L leg weakness, Parkinson disease, CAD, HTN, Hyperlipidemia, Acute lacunar infarct, B12 deficiency, convulsions, GAD, insomnia, major depression. are also affecting patient's functional outcome.   REHAB POTENTIAL: Good  CLINICAL DECISION MAKING: Evolving/moderate complexity  EVALUATION COMPLEXITY: Moderate  PLAN:  PT FREQUENCY: 2x/week  PT DURATION: 12 weeks  PLANNED INTERVENTIONS: [id number]- PT Re-evaluation, 97750- Physical Performance Testing, 97110-Therapeutic exercises, 97530- Therapeutic activity, [id number]- Neuromuscular re-education, 97535- Self Care, [id number]- Manual therapy, [id number]- Gait training, [id number]- Orthotic/Prosthetic subsequent, [phone]- Canalith repositioning, [id number]- Splinting, [id number]- Electrical stimulation (manual), [id number]- Ultrasound, 79439 (1-2 muscles), 20561 (3+ muscles)- Dry Needling, Patient/Family education, Balance training, Stair training, Taping, Joint mobilization, Spinal mobilization, Vestibular training, Visual/preceptual remediation/compensation,  Cognitive remediation, DME instructions, Cryotherapy, and Moist heat  PLAN FOR NEXT SESSION:  dynamic balance training, variable gait training.    8:45 AM, 10/04/23 Chyrl London, PT Physical Therapist - [facility]  Outpatient Physical Therapy- Main Campus [phone]

## 2023-10-03 NOTE — Therapy (Signed)
 OUTPATIENT SPEECH LANGUAGE PATHOLOGY  TREATMENT   Patient Name: Jeffery Strong MRN: [id number] DOB:December 31, 1940, 83 y.o., male Today's Date: 10/03/2023  PCP: Jeffery Oneil FALCON, MD  REFERRING PROVIDER: Sheena Pod MD    End of Session - 10/03/23 1532     Visit Number 4    Number of Visits 25    Date for SLP Re-Evaluation 12/17/23    SLP Start Time 1530    SLP Stop Time  1615    SLP Time Calculation (min) 45 min    Activity Tolerance Patient tolerated treatment well           Patient Active Problem List   Diagnosis Date Noted   Benign prostatic hyperplasia with lower urinary tract symptoms 02/17/2020   Benign essential hypertension 11/24/2019   History of CVA (cerebrovascular accident) 07/08/2018   Lacunar infarct, 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 06/30/2018   Left leg weakness 06/29/2018   Parkinson disease (HCC) 06/29/2018   CAD (coronary artery disease), native coronary artery 06/29/2018   HTN (hypertension) 06/29/2018   Medicare annual wellness visit, initial 07/02/2017   B12 deficiency 06/25/2016   Hyperlipidemia, mixed 06/13/2015   Dyslipidemia 10/07/2012   GAD (generalized anxiety disorder) 10/07/2012   Insomnia 10/07/2012   Major depression 09/23/2012   Convulsions (HCC) 04/12/2012    ONSET DATE: 09/11/23   REFERRING DIAG: CVA  THERAPY DIAG:  Aphasia  Cognitive communication deficit  Rationale for Evaluation and Treatment Rehabilitation  SUBJECTIVE:   SUBJECTIVE STATEMENT: Pt alert and cooperative.  Pt accompanied by: family member and Jeffery Strong (wife) and daughter Jeffery Strong)   PERTINENT HISTORY: Jeffery Strong is a 83 y.o. male with past medical history including Parkinson's disease, prior CVA (07/08/2018), depression, HTN who presented to [facility] on 09/11/23 with aphasia and confusion. MRI showed Multiple small infarcts in the left anterior temporal and parietal lobes and left MCA-PCA watershed distribution. Evaluated by SLP during acute  stay with diet recommendation dysphagia IDDSI L6 soft and bite sized L0 Thin Liquids. SLP noted aphasia with primarily expressive deficits, but difficulty with complex comprehension. Previously followed by Dr. Maree with baseline cognitive deficits secondary to PD (SLUMS 25/30 in 2018), difficulty remembering names, memory deficits reported since 2014.  DIAGNOSTIC FINDINGS: See above  PAIN:  Are you having pain? No   FALLS: Has patient fallen in last 6 months?  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 Pt able to select pictured object from f=2 choices with written cue 8/10, same accuracy for real objects. Pt able to copy his name, wife's name, daughter's name, and town he lives in with min-max cues, pt able to copy with delay with fair accuracy max cues. Pt with few islands of error free speech today and good attention to task for copying activity. Attempted yes/no questions; however, pt with difficulty getting into set.   PATIENT EDUCATION: Education details: See today's treatment for details Person educated: Patient's wife and daughter Education method:  Explanation Education comprehension: verbalized understanding and needs further education  HOME EXERCISE PROGRAM:        Work on Y/N, counting, written cues/total communication, writing   GOALS:  Goals reviewed with patient? Yes       SHORT TERM GOALS: Target date: 10 sessions  Patient will accurately respond to simple Y/N questions 80% accuracy with external supports as needed. Baseline: Goal status: INITIAL   2.  Patient will complete standardized assessment of language (and cognition, as appropriate). Baseline:  Goal status: INITIAL  3.  Patient will demonstrate awareness of communication breakdowns in 3/5 opportunities with usual moderate cues. Baseline:  Goal status: INITIAL    LONG TERM GOALS: Target date: 12/17/23  With Moderate A, patient will verify messages when written keyword with yes/no question is provided in conversation with 80% accuracy to reduce communication breakdowns. Baseline:  Goal status: INITIAL  2.  With Mod I, patient/family will demonstrate understanding of the following concepts: aphasia, spontaneous recovery, communication vs conversation, strengths/strategies to promote success, local resources by answering multiple choice questions with 90% accuracy when provided supported conversation in order to increase patient's participation in medical care. Baseline:  Goal status: INITIAL  3.  Patient will effectively communicate wants/needs via multimodal means >80% accuracy with assistance from communication partners in order to improve expressive language. Baseline:  Goal status: INITIAL    ASSESSMENT:  CLINICAL IMPRESSION: Patient is an 83 y.o. male who was seen today for aphasia treatment in setting of recent stroke.   Patient presents with severe Wernicke's aphasia impacting both expressive and receptive language. Patient has minimal functional communication at this time (no reliable Y/N) and has poor awareness of both expressive and receptive deficits. While it is difficult to formally assess cognition given severity of his language impairment, he is also noted to have reduced sustained attention, as he was frequently distracted during testing and seemed to want to leave early (ST  was also last evaluation of 3 this date). He has a supportive family who express desire to communicate with him and understand him. See details of tx session above. Patient would benefit from skilled ST to address language and cognitive impairments in order to increase ability to communicate wants and needs, follow commands, for increased safety and improved social connection with family and friends. Caregiver involvement likely necessary to make functional gains given the severity of client's deficits and his reduced awareness.  OBJECTIVE IMPAIRMENTS include attention, memory, awareness, expressive language, receptive language, and aphasia. These impairments are limiting patient from return to work, managing medications, managing appointments, managing finances, household responsibilities, ADLs/IADLs, and effectively communicating at home and in community. Factors affecting potential to achieve goals and functional outcome are ability to learn/carryover information, co-morbidities, cooperation/participation level, severity of impairments, and family/community support (+). Patient will benefit from skilled SLP services to address above impairments and improve overall function.  REHAB POTENTIAL: Good *with caregiver support  PLAN: SLP FREQUENCY: 2x/week  SLP DURATION: 12 weeks  PLANNED INTERVENTIONS: Cognitive reorganization, Internal/external aids, Functional tasks, Multimodal communication approach, SLP instruction and feedback, Compensatory strategies, Patient/family education, and [id number] Treatment of speech (30 or 45 min)    Delon Bangs, M.S., CCC-SLP Speech-Language Pathologist [place] - [facility] [phone] (ASCOM)

## 2023-10-08 ENCOUNTER — Ambulatory Visit

## 2023-10-08 DIAGNOSIS — R4701 Aphasia: Secondary | ICD-10-CM

## 2023-10-08 DIAGNOSIS — R2681 Unsteadiness on feet: Secondary | ICD-10-CM

## 2023-10-08 DIAGNOSIS — M6281 Muscle weakness (generalized): Secondary | ICD-10-CM

## 2023-10-08 DIAGNOSIS — R262 Difficulty in walking, not elsewhere classified: Secondary | ICD-10-CM

## 2023-10-08 DIAGNOSIS — R41841 Cognitive communication deficit: Secondary | ICD-10-CM

## 2023-10-08 NOTE — Therapy (Signed)
 OUTPATIENT PHYSICAL THERAPY TREATMENT   Patient Name: Jeffery Strong MRN: [id number] DOB:[birth year], 83 y.o., male Today's Date: 10/08/2023  PCP: Oneil Pinal, MD REFERRING PROVIDER: Evelia Parkin, MD  END OF SESSION:  PT End of Session - 10/08/23 1741     Visit Number 6    Number of Visits 24    Date for PT Re-Evaluation 12/11/23    Authorization Type [employer] Medicare    Progress Note Due on Visit 10    PT Start Time 1530    PT Stop Time 1610    PT Time Calculation (min) 40 min    Equipment Utilized During Treatment Gait belt    Activity Tolerance Patient tolerated treatment well;No increased pain    Behavior During Therapy WFL for tasks assessed/performed           Past Medical History:  Diagnosis Date   Anemia    B12 deficiency   BPH associated with nocturia    CAD (coronary artery disease)    GERD (gastroesophageal reflux disease)    occasionally.  not bad anymore. does not take meds   History of hiatal hernia    History of kidney stones 12/2019   history of stones and current stones   HLD (hyperlipidemia)    Hypertension    Parkinson disease (HCC)    right side per pt report   Stroke (HCC) 06/2018   slight balance issues   Past Surgical History:  Procedure Laterality Date   BACK SURGERY  2002   neck region, fusion. no metal   CARDIAC CATHETERIZATION     Cardiac Stents  2000   d/t chest pain and pressure   CHOLECYSTECTOMY     COLONOSCOPY WITH PROPOFOL  N/A 12/23/2015   Procedure: COLONOSCOPY WITH PROPOFOL ;  Surgeon: Lamar ONEIDA Holmes, MD;  Location: [facility] ENDOSCOPY;  Service: Endoscopy;  Laterality: N/A;   CYSTOSCOPY WITH LITHOLAPAXY N/A 01/08/2020   Procedure: CYSTOSCOPY WITH LITHOLAPAXY;  Surgeon: Francisca Redell BROCKS, MD;  Location: [facility] ORS;  Service: Urology;  Laterality: N/A;   CYSTOSCOPY/URETEROSCOPY/HOLMIUM LASER/STENT PLACEMENT Right 01/08/2020   Procedure: CYSTOSCOPY/URETEROSCOPY/HOLMIUM LASER/STENT PLACEMENT;  Surgeon: Francisca Redell BROCKS, MD;   Location: [facility] ORS;  Service: Urology;  Laterality: Right;   ESOPHAGOGASTRODUODENOSCOPY (EGD) WITH PROPOFOL  N/A 12/23/2015   Procedure: ESOPHAGOGASTRODUODENOSCOPY (EGD) WITH PROPOFOL ;  Surgeon: Lamar ONEIDA Holmes, MD;  Location: [facility] ENDOSCOPY;  Service: Endoscopy;  Laterality: N/A;   EYE SURGERY Bilateral    cataracts   HOLEP-LASER ENUCLEATION OF THE PROSTATE WITH MORCELLATION N/A 01/08/2020   Procedure: HOLEP-LASER ENUCLEATION OF THE PROSTATE WITH MORCELLATION;  Surgeon: Francisca Redell BROCKS, MD;  Location: [facility] ORS;  Service: Urology;  Laterality: N/A;   Patient Active Problem List   Diagnosis Date Noted   Benign prostatic hyperplasia with lower urinary tract symptoms 02/17/2020   Benign essential hypertension 11/24/2019   History of CVA (cerebrovascular accident) 07/08/2018   Lacunar infarct, 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 06/30/2018   Left leg weakness 06/29/2018   Parkinson disease (HCC) 06/29/2018   CAD (coronary artery disease), native coronary artery 06/29/2018   HTN (hypertension) 06/29/2018   Medicare annual wellness visit, initial 07/02/2017   B12 deficiency 06/25/2016   Hyperlipidemia, mixed 06/13/2015   Dyslipidemia 10/07/2012   GAD (generalized anxiety disorder) 10/07/2012   Insomnia 10/07/2012   Major depression 09/23/2012   Convulsions (HCC) 04/12/2012    ONSET DATE: 09/11/23  REFERRING DIAG: S13.26 (ICD-10-CM) - Personal history of transient ischemic attack (TIA), and cerebral infarction without residual deficits  THERAPY DIAG:  Difficulty in walking, not elsewhere classified  Unsteadiness on feet  Muscle weakness (generalized)  Rationale for Evaluation and Treatment: Rehabilitation  SUBJECTIVE:                                                                                                                                                                                             SUBJECTIVE STATEMENT:  Family reports things still going well for pt  from a mobility standpoint. Unable to gather subjective from patient due to severe expressive difficulty, however he seems receptive, energetic, and social.   Pt accompanied by: family member Wife,  daughter  PERTINENT HISTORY: Pt was admitted at [facility] on 09/11/23 for stroke-like symptoms of aphasia and R sided weakness. Pt has prior history of CVA in 2020 as well as Parkinsons. Pt's family reports that prior to most recent hospitalization pt was able to communicate fully as well as work in his yard independently. Pt's family states that pt's main concerns are with his speech as well as texting and writing. PMH includes L leg weakness, Parkinson disease, CAD, HTN, Hyperlipidemia, Acute lacunar infarct, B12 deficiency, convulsions, GAD, insomnia, major depression.  PAIN:  Are you having pain? No  PRECAUTIONS: None  WEIGHT BEARING RESTRICTIONS: No  FALLS: Has patient fallen in last 6 months? No  PATIENT GOALS: Get back to doing yard work. Be able to text and write  OBJECTIVE:  Note: Objective measures were completed at Evaluation unless otherwise noted.  DIAGNOSTIC FINDINGS:  CT Brain w/o contrast (09/11/23):  Negative for acute large territory infarct or sizable intracranial hemorrhage.   CTA Head and Neck w/ & w/o contrast (09/11/23): Focal occlusion versus high-grade stenosis involving the inferior trunk of the left MCA artery (series 5, image 437) with suggestion of reconstitution of flow distally.  Suggestion of focal stenosis of the left P1 segment (series 5, image 450; series 6, image 65)  No hemodynamically significant arterial stenosis within the neck.    CT Perfusion (09/11/23): No core infarct. Perfusion abnormality noted in the left posterior parietal lobe suggestive of ischemia potentially in the posterior cortical vascular watershed zone.  TREATMENT  DATE: 10/08/23  -245ft AMB overground continuously without break on lower level with a variet of load carry/lift activities disperse during this as follows:  -25lb bulgarian bag carry: 172ft over Right shoulder, 175ft over Left shoulder, 183ft over neck, double front rack carry x146ft  -15lb kettlebell single arm carry 146ft each arm   -pushing DTR in hospital transport chair x542ft  -22 minutes total, no signs of exertion or slowing of pace, no LOB, follows visual gestural cues 100% of the time, follows verbal cues <50% of the time. A few instances of Rt hemineglect and brushing arm or shoulder against a cart/door frame, etc.  -tug of war battle rope pull: 3x9ft at 28lbs (unable to follow cues for eccentric rop return); appropriate postural lean without LOB.  -10 forward step up from firm surface -> 10 forward step down to soft compliant surface-> 180 degree turn around ->10 forward step up from soft compliant surface->-10 forward step down to firm surface ->180 degree turnaround; (performed thrice only due to fatigue and time to end session).   PATIENT EDUCATION: Education details: Pt educated on purpose of physical therapy for his recovery and was instructed on HEP provided below Person educated: Patient, Spouse, and Child(ren) Education method: Explanation, Demonstration, Tactile cues, Verbal cues, and Handouts Education comprehension: returned demonstration, verbal cues required, tactile cues required, and needs further education  HOME EXERCISE PROGRAM: Access Code: [email or web address]  URL: [email or web address]  Date: 09/18/2023  Prepared by: Marina  Moser  Exercises -  Leg Extension  - 1 x daily - 7 x weekly - 2 sets - 10 reps - 5 hold  - Standing March with Counter Support  - 1 x daily - 7 x weekly - 2 sets - 10 reps - 5 hold  - Heel Raises with Counter Support  - 1 x daily - 7 x weekly - 2 sets - 10 reps - 5 hold  - Mini Squat with Counter Support  - 1 x daily - 7 x weekly -  2 sets - 10 reps - 5 hold   GOALS: Goals reviewed with patient? Yes  SHORT TERM GOALS: Target date: 10/09/2023  Patient will be independent in home exercise program to improve strength/mobility for better functional independence with ADLs. Baseline: HEP provided at eval- see above Goal status: INITIAL  LONG TERM GOALS: Target date: 12/11/2023  Patient (> 62 years old) will complete five times sit to stand test in < 15 seconds indicating an increased LE strength and improved balance. Baseline: 24.04 seconds with UE support on armrests Goal status: INITIAL  2.  Patient will increase 10 meter walk test to >0.25m/s as to improve gait speed for better community ambulation and to reduce fall risk. Baseline: 0.48m/s with no AD Goal status: INITIAL  3.  Patient will increase her Stroke Impact Scale score by >10 points for improved function and independence. Baseline: 55/80 Goal status: INITIAL  4.  Patient will demonstrate an improved Berg Balance Score of > as to demonstrate improved balance with ADLs such as sitting/standing and transfer balance and reduced fall risk. Baseline: 7/29: 29/56 Goal status: INITIAL  5.  Patient will increase six minute walk test distance to >1000 for progression to community ambulator and improve gait ability Baseline: 7/29: 455 feet in 5:53min Goal status: INITIAL  6.  Patient will reduce timed up and go to <11 seconds to reduce fall risk and demonstrate improved transfer/gait ability. Baseline:7/29: 30.60sec Goal status: INITIAL  ASSESSMENT:  CLINICAL IMPRESSION: Fervent attempt today  to simulate yard/farm based activity as pt previously has enjoyed prior to CVA. Pt tolerates entire session with considerable variation in load carry/lift, continuous, all without any notable fatigue, safety issue, or LOB. Pt doing incredibly well from a gross motor standpoint, no addiitonal skilled PT services needed at this time. Will aim for final reassessment next  session and plan for DC from PT.   OBJECTIVE IMPAIRMENTS: Abnormal gait, decreased activity tolerance, decreased balance, decreased cognition, decreased coordination, decreased endurance, decreased knowledge of condition, decreased knowledge of use of DME, decreased mobility, difficulty walking, decreased ROM, decreased strength, decreased safety awareness, hypomobility, impaired perceived functional ability, impaired flexibility, impaired vision/preception, improper body mechanics, and postural dysfunction.   ACTIVITY LIMITATIONS: carrying, lifting, bending, sitting, standing, squatting, stairs, transfers, self feeding, reach over head, locomotion level, and caring for others  PARTICIPATION LIMITATIONS: meal prep, cleaning, laundry, medication management, interpersonal relationship, driving, shopping, community activity, yard work, and church  PERSONAL FACTORS: Age, Past/current experiences, and 3+ comorbidities: L leg weakness, Parkinson disease, CAD, HTN, Hyperlipidemia, Acute lacunar infarct, B12 deficiency, convulsions, GAD, insomnia, major depression. are also affecting patient's functional outcome.   REHAB POTENTIAL: Good  CLINICAL DECISION MAKING: Evolving/moderate complexity  EVALUATION COMPLEXITY: Moderate  PLAN:  PT FREQUENCY: 2x/week  PT DURATION: 12 weeks  PLANNED INTERVENTIONS: [id number]- PT Re-evaluation, 97750- Physical Performance Testing, 97110-Therapeutic exercises, 97530- Therapeutic activity, [id number]- Neuromuscular re-education, 97535- Self Care, [id number]- Manual therapy, [id number]- Gait training, [phone]- Orthotic/Prosthetic subsequent, [phone]- Canalith repositioning, [id number]- Splinting, [id number]- Electrical stimulation (manual), [id number]- Ultrasound, 79439 (1-2 muscles), 20561 (3+ muscles)- Dry Needling, Patient/Family education, Balance training, Stair training, Taping, Joint mobilization, Spinal mobilization, Vestibular training, Visual/preceptual remediation/compensation, Cognitive  remediation, DME instructions, Cryotherapy, and Moist heat  PLAN FOR NEXT SESSION:  Reassessment and DC    6:15 PM, 10/08/23 Peggye JAYSON Linear, PT, DPT Physical Therapist - [facility]  Outpatient Physical Therapy- Main [facility] [phone]

## 2023-10-08 NOTE — Therapy (Signed)
 OUTPATIENT SPEECH LANGUAGE PATHOLOGY  TREATMENT   Patient Name: Jeffery Strong MRN: [id number] DOB:11/11/1940, 83 y.o., male Today's Date: 10/08/2023  PCP: Jeffery Strong FALCON, MD  REFERRING PROVIDER: Sheena Pod MD    End of Session - 10/08/23 1535     Visit Number 5    Number of Visits 25    Date for SLP Re-Evaluation 12/17/23    SLP Start Time 1445    SLP Stop Time  1530    SLP Time Calculation (min) 45 min    Activity Tolerance Patient tolerated treatment well           Patient Active Problem List   Diagnosis Date Noted   Benign prostatic hyperplasia with lower urinary tract symptoms 02/17/2020   Benign essential hypertension 11/24/2019   History of CVA (cerebrovascular accident) 07/08/2018   Lacunar infarct, 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 06/30/2018   Left leg weakness 06/29/2018   Parkinson disease (HCC) 06/29/2018   CAD (coronary artery disease), native coronary artery 06/29/2018   HTN (hypertension) 06/29/2018   Medicare annual wellness visit, initial 07/02/2017   B12 deficiency 06/25/2016   Hyperlipidemia, mixed 06/13/2015   Dyslipidemia 10/07/2012   GAD (generalized anxiety disorder) 10/07/2012   Insomnia 10/07/2012   Major depression 09/23/2012   Convulsions (HCC) 04/12/2012    ONSET DATE: 09/11/23   REFERRING DIAG: CVA  THERAPY DIAG:  Aphasia  Cognitive communication deficit  Rationale for Evaluation and Treatment Rehabilitation  SUBJECTIVE:   SUBJECTIVE STATEMENT: Pt alert and cooperative.  Pt accompanied by: family member and Jeffery Strong (wife) and daughter Jeffery Strong)   PERTINENT HISTORY: Jeffery Strong is a 83 y.o. male with past medical history including Parkinson's disease, prior CVA (07/08/2018), depression, HTN who presented to [facility] on 09/11/23 with aphasia and confusion. MRI showed Multiple small infarcts in the left anterior temporal and parietal lobes and left MCA-PCA watershed distribution. Evaluated by SLP during acute  stay with diet recommendation dysphagia IDDSI L6 soft and bite sized L0 Thin Liquids. SLP noted aphasia with primarily expressive deficits, but difficulty with complex comprehension. Previously followed by Jeffery Strong with baseline cognitive deficits secondary to PD (SLUMS 25/30 in 2018), difficulty remembering names, memory deficits reported since 2014.  DIAGNOSTIC FINDINGS: See above  PAIN:  Are you having pain? No   FALLS: Has patient fallen in last 6 months?  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 Daughter endorsed pt wrote name without help the other day.  Pt able to select pictured object from f=4 with written key word with 90% accuracy. Pt matched spoken words to written words from f=4 choices with 60% accuracy with repetition of stimuli. Given improving auditory comprehension, will consider use of AAC/SGD in upcoming sessions.  Pt able to copy biographical targets with min-max cueing, increased cueing needed with delay.  Pt with few islands of error free speech today and good attention to task for copying activity and with fair  attention auditory attention tasks.    PATIENT EDUCATION: Education details: See today's treatment for details Person educated: Patient's wife and daughter Education method: Explanation Education comprehension: verbalized understanding and needs  further education  HOME EXERCISE PROGRAM:        Work on Y/N, counting, written cues/total communication, writing   GOALS:  Goals reviewed with patient? Yes      SHORT TERM GOALS: Target date: 10 sessions  Patient will accurately respond to simple Y/N questions 80% accuracy with external supports as needed. Baseline: Goal status: INITIAL   2.  Patient will complete standardized assessment of language (and cognition, as appropriate). Baseline:  Goal status: INITIAL  3.  Patient will demonstrate awareness of communication breakdowns in 3/5 opportunities with usual moderate cues. Baseline:  Goal status: INITIAL    LONG TERM GOALS: Target date: 12/17/23  With Moderate A, patient will verify messages when written keyword with yes/no question is provided in conversation with 80% accuracy to reduce communication breakdowns. Baseline:  Goal status: INITIAL  2.  With Mod I, patient/family will demonstrate understanding of the following concepts: aphasia, spontaneous recovery, communication vs conversation, strengths/strategies to promote success, local resources by answering multiple choice questions with 90% accuracy when provided supported conversation in order to increase patient's participation in medical care. Baseline:  Goal status: INITIAL  3.  Patient will effectively communicate wants/needs via multimodal means >80% accuracy with assistance from communication partners in order to improve expressive language. Baseline:  Goal status: INITIAL    ASSESSMENT:  CLINICAL IMPRESSION: Patient is an 83 y.o. male who was seen today for aphasia treatment in setting of recent stroke.   Patient presents with severe Wernicke's aphasia impacting both expressive and receptive language. Patient has minimal functional communication at this time (no reliable Y/N) and has poor awareness of both expressive and receptive deficits. While it is difficult to formally assess cognition given  severity of his language impairment, he is also noted to have reduced sustained attention, as he was frequently distracted during testing and seemed to want to leave early (ST was also last evaluation of 3 this date). He has a supportive family who express desire to communicate with him and understand him. See details of tx session above. Patient would benefit from skilled ST to address language and cognitive impairments in order to increase ability to communicate wants and needs, follow commands, for increased safety and improved social connection with family and friends. Caregiver involvement likely necessary to make functional gains given the severity of client's deficits and his reduced awareness.  OBJECTIVE IMPAIRMENTS include attention, memory, awareness, expressive language, receptive language, and aphasia. These impairments are limiting patient from return to work, managing medications, managing appointments, managing finances, household responsibilities, ADLs/IADLs, and effectively communicating at home and in community. Factors affecting potential to achieve goals and functional outcome are ability to learn/carryover information, co-morbidities, cooperation/participation level, severity of impairments, and family/community support (+). Patient will benefit from skilled SLP services to address above impairments and improve overall function.  REHAB POTENTIAL: Good *with caregiver support  PLAN: SLP FREQUENCY: 2x/week  SLP DURATION: 12 weeks  PLANNED INTERVENTIONS: Cognitive reorganization, Internal/external aids, Functional tasks, Multimodal communication approach, SLP instruction and feedback, Compensatory strategies, Patient/family education, and [id number] Treatment of speech (30 or 45 min)    Delon Bangs, M.S., CCC-SLP Speech-Language Pathologist [facility] [phone] (ASCOM)

## 2023-10-10 ENCOUNTER — Ambulatory Visit

## 2023-10-10 DIAGNOSIS — R41841 Cognitive communication deficit: Secondary | ICD-10-CM

## 2023-10-10 DIAGNOSIS — R4701 Aphasia: Secondary | ICD-10-CM

## 2023-10-10 NOTE — Therapy (Addendum)
 OUTPATIENT SPEECH LANGUAGE PATHOLOGY  TREATMENT   Patient Name: Jeffery Strong MRN: [id number] DOB:10/21/1940, 83 y.o., male Today's Date: 10/10/2023  PCP: Cleotilde Oneil FALCON, MD  REFERRING PROVIDER: Sheena Pod MD    End of Session - 10/10/23 1207     Visit Number 6    Number of Visits 25    Date for SLP Re-Evaluation 12/17/23    SLP Start Time 1015    SLP Stop Time  1100    SLP Time Calculation (min) 45 min    Activity Tolerance Patient tolerated treatment well           Patient Active Problem List   Diagnosis Date Noted   Benign prostatic hyperplasia with lower urinary tract symptoms 02/17/2020   Benign essential hypertension 11/24/2019   History of CVA (cerebrovascular accident) 07/08/2018   Lacunar infarct, 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 06/30/2018   Left leg weakness 06/29/2018   Parkinson disease (HCC) 06/29/2018   CAD (coronary artery disease), native coronary artery 06/29/2018   HTN (hypertension) 06/29/2018   Medicare annual wellness visit, initial 07/02/2017   B12 deficiency 06/25/2016   Hyperlipidemia, mixed 06/13/2015   Dyslipidemia 10/07/2012   GAD (generalized anxiety disorder) 10/07/2012   Insomnia 10/07/2012   Major depression 09/23/2012   Convulsions (HCC) 04/12/2012    ONSET DATE: 09/11/23   REFERRING DIAG: CVA  THERAPY DIAG:  Aphasia  Cognitive communication deficit  Rationale for Evaluation and Treatment Rehabilitation  SUBJECTIVE:   SUBJECTIVE STATEMENT: Pt alert and cooperative.  Pt accompanied by: family member and Jackolyn (wife) and daughter Rae)   PERTINENT HISTORY: Jeffery Strong is a 83 y.o. male with past medical history including Parkinson's disease, prior CVA (07/08/2018), depression, HTN who presented to [facility] on 09/11/23 with aphasia and confusion. MRI showed Multiple small infarcts in the left anterior temporal and parietal lobes and left MCA-PCA watershed distribution. Evaluated by SLP during acute  stay with diet recommendation dysphagia IDDSI L6 soft and bite sized L0 Thin Liquids. SLP noted aphasia with primarily expressive deficits, but difficulty with complex comprehension. Previously followed by Dr. Maree with baseline cognitive deficits secondary to PD (SLUMS 25/30 in 2018), difficulty remembering names, memory deficits reported since 2014.  DIAGNOSTIC FINDINGS: See above  PAIN:  Are you having pain? No   FALLS: Has patient fallen in last 6 months?  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   PATIENT GOALS   (patient states nothing, spouse states we want to be able to understand him)  OBJECTIVE:   Today's Treatment:  SGD evaluation: [employer] DEVICE ASSESSMENT TOOL  Physical Assessment:  - Touch Accuracy Largest Size - HIT     Large Size - HIT     Normal Size - HIT     Small Size - HIT     Smallest Size - HIT   - Vision [employer] Lower Right Corner - HIT     Upper Left Corner - HIT     Lower Left Corner- HIT     Upper Right Corner - HIT     Middle - HIT Language Assessment  Phrase Completion - HIGH  Categorization - MEDIUM Repetition - LOW Confrontational Naming - LOW Conversational Speech - LOW  Device Simulation  Simple Simulation - COMPLETED  Complex Simulation -  NOT YET COMPLETED  Speech/language tx:  Introduced Fish farm manager of AAC/SGD use with pt and family. Video shown with [employer] device. Discussed pros/cons of device, device trial process, rationale for device use, results of AAC assessment. Reviewed pt's progress to date as well as communication strategies previously discussed. Pt and family agreeable to pursuing trial. With pt's permission, will send son information about signing up for a trials.   Pt with emerging  repetition for clarification during yes/no questions. With written cues, pt answered basic yes/no questions with ~70% accuracy, improving with repetition of stimuli. Pt also with improving initiation of appropriate speech, pt asking questions about SLP's children and making comments (e.g. they're beautiful). Increasing islands of error free speech noted during informal, supported conversations. SLP demo'd supported communication throughout session for pt and daughter. Per wife and daughter, pt with improving functional communication with family during birthday gathering.   PATIENT EDUCATION: Education details: See today's treatment for details Person educated: Patient's wife and daughter Education method: Explanation Education comprehension: verbalized understanding and needs further education  HOME EXERCISE PROGRAM:        Work on Y/N, counting, written cues/total communication, writing   GOALS:  Goals reviewed with patient? Yes      SHORT TERM GOALS: Target date: 10 sessions  Patient will accurately respond to simple Y/N questions 80% accuracy with external supports as needed. Baseline: Goal status: INITIAL   2.  Patient will complete standardized assessment of language (and cognition, as appropriate). Baseline:  Goal status: INITIAL  3.  Patient will demonstrate awareness of communication breakdowns in 3/5 opportunities with usual moderate cues. Baseline:  Goal status: INITIAL    LONG TERM GOALS: Target date: 12/17/23  With Moderate A, patient will verify messages when written keyword with yes/no question is provided in conversation with 80% accuracy to reduce communication breakdowns. Baseline:  Goal status: INITIAL  2.  With Mod I, patient/family will demonstrate understanding of the following concepts: aphasia, spontaneous recovery, communication vs conversation, strengths/strategies to promote success, local resources by answering multiple choice questions with 90%  accuracy when provided supported conversation in order to increase patient's participation in medical care. Baseline:  Goal status: INITIAL  3.  Patient will effectively communicate wants/needs via multimodal means >80% accuracy with assistance from communication partners in order to improve expressive language. Baseline:  Goal status: INITIAL    ASSESSMENT:  CLINICAL IMPRESSION: Patient is an 83 y.o. male who was seen today for aphasia treatment in setting of recent stroke.   Patient presents with severe Wernicke's aphasia impacting both expressive and receptive language. Patient has minimal functional communication at this time (no reliable Y/N) and has poor awareness of both expressive and receptive deficits. While it is difficult to formally assess cognition given severity of his language impairment, he is also noted to have reduced sustained attention, as he was frequently distracted during testing and seemed to want to leave early (ST was also last evaluation of 3 this date). He has a supportive family who express desire to communicate with him and understand him. See details of tx session above. Patient would benefit from skilled ST to address language and cognitive impairments in order to increase ability to communicate wants and needs, follow commands, for increased safety and improved social connection with family and friends. Caregiver involvement likely necessary to make functional gains given the severity of client's deficits and his reduced awareness.  OBJECTIVE IMPAIRMENTS include attention, memory, awareness, expressive language, receptive language, and aphasia. These impairments are limiting patient  from return to work, managing medications, managing appointments, managing finances, household responsibilities, ADLs/IADLs, and effectively communicating at home and in community. Factors affecting potential to achieve goals and functional outcome are ability to learn/carryover  information, co-morbidities, cooperation/participation level, severity of impairments, and family/community support (+). Patient will benefit from skilled SLP services to address above impairments and improve overall function.  REHAB POTENTIAL: Good *with caregiver support  PLAN: SLP FREQUENCY: 2x/week  SLP DURATION: 12 weeks  PLANNED INTERVENTIONS: Cognitive reorganization, Internal/external aids, Functional tasks, Multimodal communication approach, SLP instruction and feedback, Compensatory strategies, Patient/family education, and 07492 Treatment of speech (30 or 45 min)    Delon Bangs, M.S., CCC-SLP Speech-Language Pathologist [facility] [phone] (ASCOM)

## 2023-10-15 ENCOUNTER — Ambulatory Visit

## 2023-10-15 ENCOUNTER — Encounter

## 2023-10-15 DIAGNOSIS — R4701 Aphasia: Secondary | ICD-10-CM

## 2023-10-15 DIAGNOSIS — R41841 Cognitive communication deficit: Secondary | ICD-10-CM

## 2023-10-15 DIAGNOSIS — R2681 Unsteadiness on feet: Secondary | ICD-10-CM

## 2023-10-15 DIAGNOSIS — R278 Other lack of coordination: Secondary | ICD-10-CM

## 2023-10-15 DIAGNOSIS — M6281 Muscle weakness (generalized): Secondary | ICD-10-CM

## 2023-10-15 DIAGNOSIS — R262 Difficulty in walking, not elsewhere classified: Secondary | ICD-10-CM

## 2023-10-15 NOTE — Therapy (Signed)
 OUTPATIENT SPEECH LANGUAGE PATHOLOGY  TREATMENT   Patient Name: Jeffery Strong MRN: [id number] DOB:1940-05-23, 83 y.o., male Today's Date: 10/15/2023  PCP: Cleotilde Oneil FALCON, MD  REFERRING PROVIDER: Sheena Pod MD    End of Session - 10/15/23 1157     Visit Number 7    Number of Visits 25    Date for SLP Re-Evaluation 12/17/23    SLP Start Time 0938    SLP Stop Time  1015    SLP Time Calculation (min) 37 min           Patient Active Problem List   Diagnosis Date Noted   Benign prostatic hyperplasia with lower urinary tract symptoms 02/17/2020   Benign essential hypertension 11/24/2019   History of CVA (cerebrovascular accident) 07/08/2018   Lacunar infarct, 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 06/30/2018   Left leg weakness 06/29/2018   Parkinson disease (HCC) 06/29/2018   CAD (coronary artery disease), native coronary artery 06/29/2018   HTN (hypertension) 06/29/2018   Medicare annual wellness visit, initial 07/02/2017   B12 deficiency 06/25/2016   Hyperlipidemia, mixed 06/13/2015   Dyslipidemia 10/07/2012   GAD (generalized anxiety disorder) 10/07/2012   Insomnia 10/07/2012   Major depression 09/23/2012   Convulsions (HCC) 04/12/2012    ONSET DATE: 09/11/23   REFERRING DIAG: CVA  THERAPY DIAG:  Aphasia  Cognitive communication deficit  Rationale for Evaluation and Treatment Rehabilitation  SUBJECTIVE:   SUBJECTIVE STATEMENT: Pt alert and cooperative.  Pt accompanied by: family member and Jackolyn (wife) and daughter Rae)   PERTINENT HISTORY: Jeffery Strong is a 83 y.o. male with past medical history including Parkinson's disease, prior CVA (07/08/2018), depression, HTN who presented to [facility] on 09/11/23 with aphasia and confusion. MRI showed Multiple small infarcts in the left anterior temporal and parietal lobes and left MCA-PCA watershed distribution. Evaluated by SLP during acute stay with diet recommendation dysphagia IDDSI L6 soft  and bite sized L0 Thin Liquids. SLP noted aphasia with primarily expressive deficits, but difficulty with complex comprehension. Previously followed by Dr. Maree with baseline cognitive deficits secondary to PD (SLUMS 25/30 in 2018), difficulty remembering names, memory deficits reported since 2014.  DIAGNOSTIC FINDINGS: See above  PAIN:  Are you having pain? No   FALLS: Has patient fallen in last 6 months?  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 Currently awaiting insurance benefit check.   Pt with emerging repetition for clarification during yes/no questions. With written cues, pt answered basic yes/no questions with ~60% accuracy, improving with repetition of stimuli. Pt identified pictured objects from f=2 with 75% accuracy, improving to 100% with written cues; f=3 with 80% accuracy, improving to 100% with written cues, and f=4 with 50% accuracy, improving to 90% with written cues. Increasing islands of error free speech noted during informal, supported conversations. SLP demo'd supported communication throughout session for pt and daughter. Per wife and daughter, pt with improving functional communication with family over the weekend; however, spontaneous communication continues to be challenging. Family reports pt not always open to using written cues to support conversation. Pt and  family re-educated re: importance on utilizing total communicating to help support pt's participation in conversations.   **Of note, FEES order received from MD given pt's difficulty  swallowing per family report; however, we do not complete FEES at this facility. SLP faxed over MBSS order to referring provided at [facility].   PATIENT EDUCATION: Education details: See today's treatment for details Person educated: Patient's wife and daughter Education method: Explanation Education comprehension: verbalized understanding and needs further education  HOME EXERCISE PROGRAM:        Work on Y/N, counting, written cues/total communication, writing   GOALS:  Goals reviewed with patient? Yes      SHORT TERM GOALS: Target date: 10 sessions  Patient will accurately respond to simple Y/N questions 80% accuracy with external supports as needed. Baseline: Goal status: INITIAL   2.  Patient will complete standardized assessment of language (and cognition, as appropriate). Baseline:  Goal status: INITIAL  3.  Patient will demonstrate awareness of communication breakdowns in 3/5 opportunities with usual moderate cues. Baseline:  Goal status: INITIAL    LONG TERM GOALS: Target date: 12/17/23  With Moderate A, patient will verify messages when written keyword with yes/no question is provided in conversation with 80% accuracy to reduce communication breakdowns. Baseline:  Goal status: INITIAL  2.  With Mod I, patient/family will demonstrate understanding of the following concepts: aphasia, spontaneous recovery, communication vs conversation, strengths/strategies to promote success, local resources by answering multiple choice questions with 90% accuracy when provided supported conversation in order to increase patient's participation in medical care. Baseline:  Goal status: INITIAL  3.  Patient will effectively communicate wants/needs via multimodal means >80% accuracy with assistance from communication partners in order to improve expressive language. Baseline:  Goal status: INITIAL    ASSESSMENT:  CLINICAL IMPRESSION: Patient is an 83 y.o. male who was seen today  for aphasia treatment in setting of recent stroke.   Patient presents with severe Wernicke's aphasia impacting both expressive and receptive language. Patient has minimal functional communication at this time (no reliable Y/N) and has poor awareness of both expressive and receptive deficits. While it is difficult to formally assess cognition given severity of his language impairment, he is also noted to have reduced sustained attention, as he was frequently distracted during testing and seemed to want to leave early (ST was also last evaluation of 3 this date). He has a supportive family who express desire to communicate with him and understand him. See details of tx session above. Patient would benefit from skilled ST to address language and cognitive impairments in order to increase ability to communicate wants and needs, follow commands, for increased safety and improved social connection with family and friends. Caregiver involvement likely necessary to make functional gains given the severity of client's deficits and his reduced awareness.  OBJECTIVE IMPAIRMENTS include attention, memory, awareness, expressive language, receptive language, and aphasia. These impairments are limiting patient from return to work, managing medications, managing appointments, managing finances, household responsibilities, ADLs/IADLs, and effectively communicating at home and in community. Factors affecting potential to achieve goals and functional outcome are ability to learn/carryover information, co-morbidities, cooperation/participation level, severity of impairments, and family/community support (+). Patient will benefit from skilled SLP services to address above impairments and improve overall function.  REHAB POTENTIAL: Good *with caregiver support  PLAN: SLP FREQUENCY: 2x/week  SLP DURATION: 12 weeks  PLANNED INTERVENTIONS: Cognitive reorganization, Internal/external aids, Functional tasks, Multimodal  communication approach, SLP instruction and feedback, Compensatory strategies, Patient/family education, and [id number]  Treatment of speech (30 or 45 min)    Delon Bangs, M.S., CCC-SLP Speech-Language Pathologist [facility] [phone] (ASCOM)

## 2023-10-15 NOTE — Therapy (Signed)
 OUTPATIENT PHYSICAL THERAPY TREATMENT   Patient Name: Jeffery Strong MRN: [id number] DOB:Aug 31, 1940, 83 y.o., male Today's Date: 10/15/2023  PCP: Oneil Pinal, MD REFERRING PROVIDER: Evelia Parkin, MD  END OF SESSION:  PT End of Session - 10/15/23 1016     Visit Number 7    Number of Visits 24    Date for PT Re-Evaluation 12/11/23    Authorization Type [employer] Medicare    Progress Note Due on Visit 10    PT Start Time 1016    PT Stop Time 1100    PT Time Calculation (min) 44 min    Equipment Utilized During Treatment Gait belt    Activity Tolerance Patient tolerated treatment well;No increased pain    Behavior During Therapy WFL for tasks assessed/performed           Past Medical History:  Diagnosis Date   Anemia    B12 deficiency   BPH associated with nocturia    CAD (coronary artery disease)    GERD (gastroesophageal reflux disease)    occasionally.  not bad anymore. does not take meds   History of hiatal hernia    History of kidney stones 12/2019   history of stones and current stones   HLD (hyperlipidemia)    Hypertension    Parkinson disease (HCC)    right side per pt report   Stroke (HCC) 06/2018   slight balance issues   Past Surgical History:  Procedure Laterality Date   BACK SURGERY  2002   neck region, fusion. no metal   CARDIAC CATHETERIZATION     Cardiac Stents  2000   d/t chest pain and pressure   CHOLECYSTECTOMY     COLONOSCOPY WITH PROPOFOL  N/A 12/23/2015   Procedure: COLONOSCOPY WITH PROPOFOL ;  Surgeon: Lamar ONEIDA Holmes, MD;  Location: [facility] ENDOSCOPY;  Service: Endoscopy;  Laterality: N/A;   CYSTOSCOPY WITH LITHOLAPAXY N/A 01/08/2020   Procedure: CYSTOSCOPY WITH LITHOLAPAXY;  Surgeon: Francisca Redell BROCKS, MD;  Location: [facility] ORS;  Service: Urology;  Laterality: N/A;   CYSTOSCOPY/URETEROSCOPY/HOLMIUM LASER/STENT PLACEMENT Right 01/08/2020   Procedure: CYSTOSCOPY/URETEROSCOPY/HOLMIUM LASER/STENT PLACEMENT;  Surgeon: Francisca Redell BROCKS, MD;   Location: [facility] ORS;  Service: Urology;  Laterality: Right;   ESOPHAGOGASTRODUODENOSCOPY (EGD) WITH PROPOFOL  N/A 12/23/2015   Procedure: ESOPHAGOGASTRODUODENOSCOPY (EGD) WITH PROPOFOL ;  Surgeon: Lamar ONEIDA Holmes, MD;  Location: [facility] ENDOSCOPY;  Service: Endoscopy;  Laterality: N/A;   EYE SURGERY Bilateral    cataracts   HOLEP-LASER ENUCLEATION OF THE PROSTATE WITH MORCELLATION N/A 01/08/2020   Procedure: HOLEP-LASER ENUCLEATION OF THE PROSTATE WITH MORCELLATION;  Surgeon: Francisca Redell BROCKS, MD;  Location: [facility] ORS;  Service: Urology;  Laterality: N/A;   Patient Active Problem List   Diagnosis Date Noted   Benign prostatic hyperplasia with lower urinary tract symptoms 02/17/2020   Benign essential hypertension 11/24/2019   History of CVA (cerebrovascular accident) 07/08/2018   Lacunar infarct, 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 06/30/2018   Left leg weakness 06/29/2018   Parkinson disease (HCC) 06/29/2018   CAD (coronary artery disease), native coronary artery 06/29/2018   HTN (hypertension) 06/29/2018   Medicare annual wellness visit, initial 07/02/2017   B12 deficiency 06/25/2016   Hyperlipidemia, mixed 06/13/2015   Dyslipidemia 10/07/2012   GAD (generalized anxiety disorder) 10/07/2012   Insomnia 10/07/2012   Major depression 09/23/2012   Convulsions (HCC) 04/12/2012    ONSET DATE: 09/11/23  REFERRING DIAG: S13.26 (ICD-10-CM) - Personal history of transient ischemic attack (TIA), and cerebral infarction without residual deficits  THERAPY DIAG:  Aphasia  Cognitive communication deficit  Difficulty in walking, not elsewhere classified  Unsteadiness on feet  Muscle weakness (generalized)  Other lack of coordination  Rationale for Evaluation and Treatment: Rehabilitation  SUBJECTIVE:                                                                                                                                                                                              SUBJECTIVE STATEMENT:  Family continues to report pt being doing well at home from a mobility standpoint.  They report he is excited and ready to be discharged from therapy.  Unable to gather subjective from patient due to severe expressive difficulty, however he seems receptive, energetic, and social.   Pt accompanied by: family member Wife,  daughter  PERTINENT HISTORY: Pt was admitted at [facility] on 09/11/23 for stroke-like symptoms of aphasia and R sided weakness. Pt has prior history of CVA in 2020 as well as Parkinsons. Pt's family reports that prior to most recent hospitalization pt was able to communicate fully as well as work in his yard independently. Pt's family states that pt's main concerns are with his speech as well as texting and writing. PMH includes L leg weakness, Parkinson disease, CAD, HTN, Hyperlipidemia, Acute lacunar infarct, B12 deficiency, convulsions, GAD, insomnia, major depression.  PAIN:  Are you having pain? No  PRECAUTIONS: None  WEIGHT BEARING RESTRICTIONS: No  FALLS: Has patient fallen in last 6 months? No  PATIENT GOALS: Get back to doing yard work. Be able to text and write  OBJECTIVE:  Note: Objective measures were completed at Evaluation unless otherwise noted.  DIAGNOSTIC FINDINGS:  CT Brain w/o contrast (09/11/23):  Negative for acute large territory infarct or sizable intracranial hemorrhage.   CTA Head and Neck w/ & w/o contrast (09/11/23): Focal occlusion versus high-grade stenosis involving the inferior trunk of the left MCA artery (series 5, image 437) with suggestion of reconstitution of flow distally.  Suggestion of focal stenosis of the left P1 segment (series 5, image 450; series 6, image 65)  No hemodynamically significant arterial stenosis within the neck.    CT Perfusion (09/11/23): No core infarct. Perfusion abnormality noted in the left posterior parietal lobe suggestive of ischemia potentially in the posterior cortical vascular  watershed zone.  TREATMENT DATE: 10/15/23   Physical Performance Testing:  Patient demonstrates increased fall risk as noted by score of  48/56 on Berg Balance Scale.  (<36= high risk for falls, close to 100%; 37-45 significant >80%; 46-51 moderate >50%; 52-55 lower >25%)   Five times Sit to Stand Test (FTSS)  TIME: 15.41 sec  Cut off scores indicative of increased fall risk: >12 sec CVA, >16 sec PD, >13 sec vestibular (ANPTA Core Set of Outcome Measures for Adults with Neurologic Conditions, 2018)  PT instructed pt in TUG: 12.39 sec ( >13.5 sec indicates increased fall risk)  6 Min Walk Test:  Instructed patient to ambulate as quickly and as safely as possible for 6 minutes using LRAD. Patient was allowed to take standing rest breaks without stopping the test, but if the patient required a sitting rest break the clock would be stopped and the test would be over.  Results: 1148 feet using no AD with supervision. Results indicate that the patient has reduced endurance with ambulation compared to age matched norms.  Age Matched Norms (in meters): 30-69 yo M: 41 F: 9, 36-79 yo M: 16 F: 471, 49-89 yo M: 417 F: 392 MDC: 58.21 meters (190.98 feet) or 50 meters (ANPTA Core Set of Outcome Measures for Adults with Neurologic Conditions, 2018)   PATIENT EDUCATION: Education details: Pt educated on purpose of physical therapy for his recovery and was instructed on HEP provided below Person educated: Patient, Spouse, and Child(ren) Education method: Explanation, Demonstration, Tactile cues, Verbal cues, and Handouts Education comprehension: returned demonstration, verbal cues required, tactile cues required, and needs further education  HOME EXERCISE PROGRAM: Access Code: [email or web address]  URL: [email or web address]  Date: 09/18/2023  Prepared by: Marina   Moser  Exercises -  Leg Extension  - 1 x daily - 7 x weekly - 2 sets - 10 reps - 5 hold  - Standing March with Counter Support  - 1 x daily - 7 x weekly - 2 sets - 10 reps - 5 hold  - Heel Raises with Counter Support  - 1 x daily - 7 x weekly - 2 sets - 10 reps - 5 hold  - Mini Squat with Counter Support  - 1 x daily - 7 x weekly - 2 sets - 10 reps - 5 hold   GOALS: Goals reviewed with patient? Yes  SHORT TERM GOALS: Target date: 10/09/2023  Patient will be independent in home exercise program to improve strength/mobility for better functional independence with ADLs. Baseline: HEP provided at eval- see above Goal status: INITIAL  LONG TERM GOALS: Target date: 12/11/2023  Patient (> 94 years old) will complete five times sit to stand test in < 15 seconds indicating an increased LE strength and improved balance. Baseline: 24.04 seconds with UE support on armrests 10/15/23: 15.41 on armrests Goal status: PARTIALLY MET  2.  Patient will increase 10 meter walk test to >0.30m/s as to improve gait speed for better community ambulation and to reduce fall risk. Baseline: 0.60m/s with no AD 10/15/23: 1.14 m/s Goal status: MET  3.  Patient will increase his Stroke Impact Scale score by >10 points for improved function and independence. Baseline: 55/80 10/15/23: 69/80 Goal status: MET  4.  Patient will demonstrate an improved Berg Balance Score of > as to demonstrate improved balance with ADLs such as sitting/standing and transfer balance and reduced fall risk. Baseline: 7/29: 29/56 10/15/23: 48/56 Goal status: MET  5.  Patient will increase six minute walk test distance to >1000  for progression to community ambulator and improve gait ability Baseline: 7/29: 455 feet in 5:26min 10/15/23: 1148'  Goal status: MET  6.  Patient will reduce timed up and go to <11 seconds to reduce fall risk and demonstrate improved transfer/gait ability. Baseline:7/29: 30.60sec 10/15/23: 12.39 sec Goal status:  MET  ASSESSMENT:  CLINICAL IMPRESSION:   Pt has made consistent progress towards goals, meeting 5/6 total and improving on the final missed goal.  Pt and family feel as though he has made significant progress and is ready for discharge.  Pt encouraged to continue to perform HEP and activities around the farm.  Pt and family members are in agreeance with the change in POC.  Pt encouraged to reach out to the clinic if any other needs arise in the meantime.  Pt is formally discharged at this time.    OBJECTIVE IMPAIRMENTS: Abnormal gait, decreased activity tolerance, decreased balance, decreased cognition, decreased coordination, decreased endurance, decreased knowledge of condition, decreased knowledge of use of DME, decreased mobility, difficulty walking, decreased ROM, decreased strength, decreased safety awareness, hypomobility, impaired perceived functional ability, impaired flexibility, impaired vision/preception, improper body mechanics, and postural dysfunction.   ACTIVITY LIMITATIONS: carrying, lifting, bending, sitting, standing, squatting, stairs, transfers, self feeding, reach over head, locomotion level, and caring for others  PARTICIPATION LIMITATIONS: meal prep, cleaning, laundry, medication management, interpersonal relationship, driving, shopping, community activity, yard work, and church  PERSONAL FACTORS: Age, Past/current experiences, and 3+ comorbidities: L leg weakness, Parkinson disease, CAD, HTN, Hyperlipidemia, Acute lacunar infarct, B12 deficiency, convulsions, GAD, insomnia, major depression. are also affecting patient's functional outcome.   REHAB POTENTIAL: Good  CLINICAL DECISION MAKING: Evolving/moderate complexity  EVALUATION COMPLEXITY: Moderate  PLAN:  PT FREQUENCY: 2x/week  PT DURATION: 12 weeks  PLANNED INTERVENTIONS: 97164- PT Re-evaluation, 97750- Physical Performance Testing, 97110-Therapeutic exercises, 97530- Therapeutic activity, [id number]- Neuromuscular  re-education, 97535- Self Care, [id number]- Manual therapy, [id number]- Gait training, [phone]- Orthotic/Prosthetic subsequent, [phone]- Canalith repositioning, [id number]- Splinting, [id number]- Electrical stimulation (manual), [id number]- Ultrasound, 79439 (1-2 muscles), 20561 (3+ muscles)- Dry Needling, Patient/Family education, Balance training, Stair training, Taping, Joint mobilization, Spinal mobilization, Vestibular training, Visual/preceptual remediation/compensation, Cognitive remediation, DME instructions, Cryotherapy, and Moist heat  PLAN FOR NEXT SESSION:  D/c'd   Fonda Simpers, PT, DPT Physical Therapist - [place]  [facility]  10/15/23, 1:36 PM

## 2023-10-17 ENCOUNTER — Ambulatory Visit: Admitting: Physical Therapy

## 2023-10-17 ENCOUNTER — Ambulatory Visit

## 2023-10-17 DIAGNOSIS — R41841 Cognitive communication deficit: Secondary | ICD-10-CM

## 2023-10-17 DIAGNOSIS — R4701 Aphasia: Secondary | ICD-10-CM | POA: Diagnosis not present

## 2023-10-17 NOTE — Therapy (Signed)
 OUTPATIENT SPEECH LANGUAGE PATHOLOGY  TREATMENT   Patient Name: Jeffery Strong MRN: [id number] DOB:15-Aug-1940, 83 y.o., male Today's Date: 10/17/2023  PCP: Cleotilde Oneil FALCON, MD  REFERRING PROVIDER: Sheena Pod MD    End of Session - 10/17/23 1008     Visit Number 8    Number of Visits 25    Date for SLP Re-Evaluation 12/17/23    SLP Start Time 1015    SLP Stop Time  1100    SLP Time Calculation (min) 45 min    Activity Tolerance Patient tolerated treatment well           Patient Active Problem List   Diagnosis Date Noted   Benign prostatic hyperplasia with lower urinary tract symptoms 02/17/2020   Benign essential hypertension 11/24/2019   History of CVA (cerebrovascular accident) 07/08/2018   Lacunar infarct, 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 06/30/2018   Left leg weakness 06/29/2018   Parkinson disease (HCC) 06/29/2018   CAD (coronary artery disease), native coronary artery 06/29/2018   HTN (hypertension) 06/29/2018   Medicare annual wellness visit, initial 07/02/2017   B12 deficiency 06/25/2016   Hyperlipidemia, mixed 06/13/2015   Dyslipidemia 10/07/2012   GAD (generalized anxiety disorder) 10/07/2012   Insomnia 10/07/2012   Major depression 09/23/2012   Convulsions (HCC) 04/12/2012    ONSET DATE: 09/11/23   REFERRING DIAG: CVA  THERAPY DIAG:  Aphasia  Cognitive communication deficit  Rationale for Evaluation and Treatment Rehabilitation  SUBJECTIVE:   SUBJECTIVE STATEMENT: Pt alert and cooperative.  Pt accompanied by: family member and Jackolyn (wife) and daughter Rae)   PERTINENT HISTORY: Jeffery Strong is a 83 y.o. male with past medical history including Parkinson's disease, prior CVA (07/08/2018), depression, HTN who presented to [facility] on 09/11/23 with aphasia and confusion. MRI showed Multiple small infarcts in the left anterior temporal and parietal lobes and left MCA-PCA watershed distribution. Evaluated by SLP during acute  stay with diet recommendation dysphagia IDDSI L6 soft and bite sized L0 Thin Liquids. SLP noted aphasia with primarily expressive deficits, but difficulty with complex comprehension. Previously followed by Dr. Maree with baseline cognitive deficits secondary to PD (SLUMS 25/30 in 2018), difficulty remembering names, memory deficits reported since 2014.  DIAGNOSTIC FINDINGS: See above  PAIN:  Are you having pain? No   FALLS: Has patient fallen in last 6 months?  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 Discussed timeline of trial and rational for SGD with pt and family. Hopefully, pt will receive SGD by next session.  Pt with emerging repetition for clarification during yes/no questions. With written cues, pt answered basic yes/no questions with ~60% accuracy, improving with repetition of stimuli. Pt identified objects from f=3 with 80% accuracy, improving to 100% with written cues; f=4 with 75% accuracy, improving to 100% with written cues. Increasing islands of error free speech noted during informal, supported conversations. SLP demo'd supported communication throughout session for pt and daughter. Per wife, pt allowed for her to write key words yesterday with some improvement in communication.    PATIENT  EDUCATION: Education details: See today's treatment for details Person educated: Patient's wife and daughter Education method: Explanation Education comprehension: verbalized understanding and needs further education  HOME EXERCISE  PROGRAM:        Work on Y/N, counting, written cues/total communication, writing   GOALS:  Goals reviewed with patient? Yes      SHORT TERM GOALS: Target date: 10 sessions  Patient will accurately respond to simple Y/N questions 80% accuracy with external supports as needed. Baseline: Goal status: INITIAL   2.  Patient will complete standardized assessment of language (and cognition, as appropriate). Baseline:  Goal status: INITIAL  3.  Patient will demonstrate awareness of communication breakdowns in 3/5 opportunities with usual moderate cues. Baseline:  Goal status: INITIAL    LONG TERM GOALS: Target date: 12/17/23  With Moderate A, patient will verify messages when written keyword with yes/no question is provided in conversation with 80% accuracy to reduce communication breakdowns. Baseline:  Goal status: INITIAL  2.  With Mod I, patient/family will demonstrate understanding of the following concepts: aphasia, spontaneous recovery, communication vs conversation, strengths/strategies to promote success, local resources by answering multiple choice questions with 90% accuracy when provided supported conversation in order to increase patient's participation in medical care. Baseline:  Goal status: INITIAL  3.  Patient will effectively communicate wants/needs via multimodal means >80% accuracy with assistance from communication partners in order to improve expressive language. Baseline:  Goal status: INITIAL    ASSESSMENT:  CLINICAL IMPRESSION: Patient is an 83 y.o. male who was seen today for aphasia treatment in setting of recent stroke.   Based on initial assessment, Patient presents with severe Wernicke's aphasia impacting both expressive and receptive language. See details of tx session above. Patient would benefit from skilled ST to address language and cognitive impairments in order to increase ability to communicate wants and needs, follow commands, for increased  safety and improved social connection with family and friends. Caregiver involvement likely necessary to make functional gains given the severity of client's deficits and his reduced awareness.  OBJECTIVE IMPAIRMENTS include attention, memory, awareness, expressive language, receptive language, and aphasia. These impairments are limiting patient from return to work, managing medications, managing appointments, managing finances, household responsibilities, ADLs/IADLs, and effectively communicating at home and in community. Factors affecting potential to achieve goals and functional outcome are ability to learn/carryover information, co-morbidities, cooperation/participation level, severity of impairments, and family/community support (+). Patient will benefit from skilled SLP services to address above impairments and improve overall function.  REHAB POTENTIAL: Good *with caregiver support  PLAN: SLP FREQUENCY: 2x/week  SLP DURATION: 12 weeks  PLANNED INTERVENTIONS: Cognitive reorganization, Internal/external aids, Functional tasks, Multimodal communication approach, SLP instruction and feedback, Compensatory strategies, Patient/family education, and [id number] Treatment of speech (30 or 45 min)    Delon Bangs, M.S., CCC-SLP Speech-Language Pathologist [facility] [phone] (ASCOM)

## 2023-10-22 ENCOUNTER — Ambulatory Visit

## 2023-10-22 DIAGNOSIS — R4701 Aphasia: Secondary | ICD-10-CM | POA: Diagnosis not present

## 2023-10-22 DIAGNOSIS — R41841 Cognitive communication deficit: Secondary | ICD-10-CM

## 2023-10-22 NOTE — Therapy (Signed)
 OUTPATIENT SPEECH LANGUAGE PATHOLOGY  TREATMENT   Patient Name: Jeffery Strong MRN: [id number] DOB:1940-12-30, 83 y.o., male Today's Date: 10/22/2023  PCP: Cleotilde Oneil FALCON, MD  REFERRING PROVIDER: Sheena Pod MD    End of Session - 10/22/23 1101     Visit Number 9    Number of Visits 25    Date for SLP Re-Evaluation 12/17/23    SLP Start Time 1100    SLP Stop Time  1145    SLP Time Calculation (min) 45 min    Activity Tolerance Patient tolerated treatment well           Patient Active Problem List   Diagnosis Date Noted   Benign prostatic hyperplasia with lower urinary tract symptoms 02/17/2020   Benign essential hypertension 11/24/2019   History of CVA (cerebrovascular accident) 07/08/2018   Lacunar infarct, 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 06/30/2018   Left leg weakness 06/29/2018   Parkinson disease (HCC) 06/29/2018   CAD (coronary artery disease), native coronary artery 06/29/2018   HTN (hypertension) 06/29/2018   Medicare annual wellness visit, initial 07/02/2017   B12 deficiency 06/25/2016   Hyperlipidemia, mixed 06/13/2015   Dyslipidemia 10/07/2012   GAD (generalized anxiety disorder) 10/07/2012   Insomnia 10/07/2012   Major depression 09/23/2012   Convulsions (HCC) 04/12/2012    ONSET DATE: 09/11/23   REFERRING DIAG: CVA  THERAPY DIAG:  Aphasia  Cognitive communication deficit  Rationale for Evaluation and Treatment Rehabilitation  SUBJECTIVE:   SUBJECTIVE STATEMENT: Pt alert and cooperative.  Pt accompanied by: family member and Jackolyn (wife) and daughter Rae)   PERTINENT HISTORY: Jeffery Strong is a 83 y.o. male with past medical history including Parkinson's disease, prior CVA (07/08/2018), depression, HTN who presented to [facility] on 09/11/23 with aphasia and confusion. MRI showed Multiple small infarcts in the left anterior temporal and parietal lobes and left MCA-PCA watershed distribution. Evaluated by SLP during acute  stay with diet recommendation dysphagia IDDSI L6 soft and bite sized L0 Thin Liquids. SLP noted aphasia with primarily expressive deficits, but difficulty with complex comprehension. Previously followed by Dr. Maree with baseline cognitive deficits secondary to PD (SLUMS 25/30 in 2018), difficulty remembering names, memory deficits reported since 2014.  DIAGNOSTIC FINDINGS: See above  PAIN:  Are you having pain? No   FALLS: Has patient fallen in last 6 months?  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 Discussed timeline of trial and rational for SGD with pt and family. Daughter-in-law talking with Reyna this week per daughter. Hopeful to secure trial device shortly therafter.   Pt with emerging repetition for clarification during yes/no questions. With written cues, pt answered basic yes/no questions with ~60% accuracy, improving with repetition of stimuli. Pt identified objects/actions from f=2 with 80% accuracy, improving to 100% with written cues; f=3 with 60% accuracy, improving to 100% with written cues and repetition. Increasing islands of error free speech noted during informal, supported conversations. SLP demo'd supported communication throughout session for pt's wife and daughter. Supportive counseling provided as family endorsed pt with increased  frustration with communication over the weekend.   PATIENT EDUCATION: Education details: See today's treatment for details Person educated: Patient's wife and daughter Education  method: Explanation Education comprehension: verbalized understanding and needs further education  HOME EXERCISE PROGRAM:        Work on Y/N, counting, written cues/total communication, writing   GOALS:  Goals reviewed with patient? Yes      SHORT TERM GOALS: Target date: 10 sessions  Patient will accurately respond to simple Y/N questions 80% accuracy with external supports as needed. Baseline: Goal status: INITIAL   2.  Patient will complete standardized assessment of language (and cognition, as appropriate). Baseline:  Goal status: INITIAL  3.  Patient will demonstrate awareness of communication breakdowns in 3/5 opportunities with usual moderate cues. Baseline:  Goal status: INITIAL    LONG TERM GOALS: Target date: 12/17/23  With Moderate A, patient will verify messages when written keyword with yes/no question is provided in conversation with 80% accuracy to reduce communication breakdowns. Baseline:  Goal status: INITIAL  2.  With Mod I, patient/family will demonstrate understanding of the following concepts: aphasia, spontaneous recovery, communication vs conversation, strengths/strategies to promote success, local resources by answering multiple choice questions with 90% accuracy when provided supported conversation in order to increase patient's participation in medical care. Baseline:  Goal status: INITIAL  3.  Patient will effectively communicate wants/needs via multimodal means >80% accuracy with assistance from communication partners in order to improve expressive language. Baseline:  Goal status: INITIAL    ASSESSMENT:  CLINICAL IMPRESSION: Patient is an 83 y.o. male who was seen today for aphasia treatment in setting of recent stroke.   Based on initial assessment, Patient presents with severe Wernicke's aphasia impacting both expressive and receptive language. See details of tx session above. Patient would benefit from skilled ST to address language and  cognitive impairments in order to increase ability to communicate wants and needs, follow commands, for increased safety and improved social connection with family and friends. Caregiver involvement likely necessary to make functional gains given the severity of client's deficits and his reduced awareness.  OBJECTIVE IMPAIRMENTS include attention, memory, awareness, expressive language, receptive language, and aphasia. These impairments are limiting patient from return to work, managing medications, managing appointments, managing finances, household responsibilities, ADLs/IADLs, and effectively communicating at home and in community. Factors affecting potential to achieve goals and functional outcome are ability to learn/carryover information, co-morbidities, cooperation/participation level, severity of impairments, and family/community support (+). Patient will benefit from skilled SLP services to address above impairments and improve overall function.  REHAB POTENTIAL: Good *with caregiver support  PLAN: SLP FREQUENCY: 2x/week  SLP DURATION: 12 weeks  PLANNED INTERVENTIONS: Cognitive reorganization, Internal/external aids, Functional tasks, Multimodal communication approach, SLP instruction and feedback, Compensatory strategies, Patient/family education, and 07492 Treatment of speech (30 or 45 min)    Delon Bangs, M.S., CCC-SLP Speech-Language Pathologist [place] - [facility] [phone] (ASCOM)

## 2023-10-24 ENCOUNTER — Ambulatory Visit: Admitting: Physical Therapy

## 2023-10-24 ENCOUNTER — Encounter

## 2023-10-24 ENCOUNTER — Ambulatory Visit

## 2023-10-24 DIAGNOSIS — R41841 Cognitive communication deficit: Secondary | ICD-10-CM

## 2023-10-24 DIAGNOSIS — R4701 Aphasia: Secondary | ICD-10-CM | POA: Diagnosis not present

## 2023-10-24 NOTE — Therapy (Signed)
 OUTPATIENT SPEECH LANGUAGE PATHOLOGY  TREATMENT / PROGRESS NOTE   Patient Name: Jeffery Strong MRN: [id number] DOB:09/11/1940, 83 y.o., male Today's Date: 10/24/2023  PCP: Cleotilde Oneil FALCON, MD  REFERRING PROVIDER: Sheena Pod MD  Speech Therapy Progress Note  Dates of Reporting Period: 09/18/23 to 10/24/23  Objective: Patient has been seen for 10 speech therapy sessions this reporting period targeting aphasia. Patient is making progress toward LTGs and met 3/3 STGs this reporting period. See skilled intervention, clinical impressions, and goals below for details.     End of Session - 10/24/23 0928     Visit Number 10    Number of Visits 25    Date for SLP Re-Evaluation 12/17/23    SLP Start Time 0930    SLP Stop Time  1015    SLP Time Calculation (min) 45 min    Activity Tolerance Patient tolerated treatment well           Patient Active Problem List   Diagnosis Date Noted   Benign prostatic hyperplasia with lower urinary tract symptoms 02/17/2020   Benign essential hypertension 11/24/2019   History of CVA (cerebrovascular accident) 07/08/2018   Lacunar infarct, 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 06/30/2018   Left leg weakness 06/29/2018   Parkinson disease (HCC) 06/29/2018   CAD (coronary artery disease), native coronary artery 06/29/2018   HTN (hypertension) 06/29/2018   Medicare annual wellness visit, initial 07/02/2017   B12 deficiency 06/25/2016   Hyperlipidemia, mixed 06/13/2015   Dyslipidemia 10/07/2012   GAD (generalized anxiety disorder) 10/07/2012   Insomnia 10/07/2012   Major depression 09/23/2012   Convulsions (HCC) 04/12/2012    ONSET DATE: 09/11/23   REFERRING DIAG: CVA  THERAPY DIAG:  Aphasia  Cognitive communication deficit  Rationale for Evaluation and Treatment Rehabilitation  SUBJECTIVE:   SUBJECTIVE STATEMENT: Pt alert and cooperative.  Pt accompanied by: family member and [name] (wife) and son)   PERTINENT  HISTORY: Jeffery Strong is a 83 y.o. male with past medical history including Parkinson's disease, prior CVA (07/08/2018), depression, HTN who presented to [facility] on 09/11/23 with aphasia and confusion. MRI showed Multiple small infarcts in the left anterior temporal and parietal lobes and left MCA-PCA watershed distribution. Evaluated by SLP during acute stay with diet recommendation dysphagia IDDSI L6 soft and bite sized L0 Thin Liquids. SLP noted aphasia with primarily expressive deficits, but difficulty with complex comprehension. Previously followed by Dr. Maree with baseline cognitive deficits secondary to PD (SLUMS 25/30 in 2018), difficulty remembering names, memory deficits reported since 2014.  DIAGNOSTIC FINDINGS: See above  PAIN:  Are you having pain? No   FALLS: Has patient fallen in last 6 months?  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 Will plan to trial in upcoming sessions. Reviewed rationale for trial, expectations, and role of SGD in supporting verbal communication.   Pt with emerging repetition for clarification during yes/no questions. With written cues, pt answered basic yes/no questions with >80% accuracy, improving with repetition of stimuli. Pt answered questions about pictured actions (f=2 with >80% accuracy with repetition; f=3 with  70% accuracy with repetition). Increasing islands of error free speech noted during informal, supported conversations. SLP demo'd supported communication throughout session for pt's wife and son. Pt copied biographical targets with error  awareness with inconsistent ability to repair ~70% of the time.   PATIENT EDUCATION: Education details: See today's treatment for details Person educated: Patient's wife and daughter Education method: Explanation Education comprehension: verbalized understanding and needs further education  HOME EXERCISE PROGRAM:        Work on Y/N, counting, written cues/total communication, writing   GOALS:  Goals reviewed with patient? Yes      SHORT TERM GOALS: Target date: 10 sessions  Patient will accurately respond to simple Y/N questions 80% accuracy with external supports as needed. Baseline: Goal status: MET; continue for consistency   2.  Patient will complete standardized assessment of language (and cognition, as appropriate). Baseline:  Goal status: MET  3.  Patient will demonstrate awareness of communication breakdowns in 3/5 opportunities with usual moderate cues. Baseline:  Goal status: MET    LONG TERM GOALS: Target date: 12/17/23  With Moderate A, patient will verify messages when written keyword with yes/no question is provided in conversation with 80% accuracy to reduce communication breakdowns. Baseline:  Goal status: INITIAL  2.  With Mod I, patient/family will demonstrate understanding of the following concepts: aphasia, spontaneous recovery, communication vs conversation, strengths/strategies to promote success, local resources by answering multiple choice questions with 90% accuracy when provided supported conversation in order to increase patient's participation in medical care. Baseline:  Goal status: INITIAL  3.  Patient will effectively communicate wants/needs via multimodal means >80% accuracy with assistance from communication partners in order to improve expressive language. Baseline:  Goal status: INITIAL    ASSESSMENT:  CLINICAL IMPRESSION: Patient is an 83 y.o. male who was seen today for aphasia treatment in setting of recent stroke.   Based on  initial assessment, Patient presents with severe Wernicke's aphasia impacting both expressive and receptive language. See details of tx session above. Patient would benefit from skilled ST to address language and cognitive impairments in order to increase ability to communicate wants and needs, follow commands, for increased safety and improved social connection with family and friends. Caregiver involvement likely necessary to make functional gains given the severity of client's deficits and his reduced awareness.  OBJECTIVE IMPAIRMENTS include attention, memory, awareness, expressive language, receptive language, and aphasia. These impairments are limiting patient from return to work, managing medications, managing appointments, managing finances, household responsibilities, ADLs/IADLs, and effectively communicating at home and in community. Factors affecting potential to achieve goals and functional outcome are ability to learn/carryover information, co-morbidities, cooperation/participation level, severity of impairments, and family/community support (+). Patient will benefit from skilled SLP services to address above impairments and improve overall function.  REHAB POTENTIAL: Good *with caregiver support  PLAN: SLP FREQUENCY: 2x/week  SLP DURATION: 12 weeks  PLANNED INTERVENTIONS: Cognitive reorganization, Internal/external aids, Functional tasks, Multimodal communication approach, SLP instruction and feedback, Compensatory strategies, Patient/family education, and 07492 Treatment of speech (30 or 45 min)    Delon Bangs, M.S., CCC-SLP Speech-Language Pathologist [place] - [facility] [phone] (ASCOM)

## 2023-10-29 ENCOUNTER — Encounter

## 2023-10-29 ENCOUNTER — Ambulatory Visit: Attending: Internal Medicine

## 2023-10-29 DIAGNOSIS — R41841 Cognitive communication deficit: Secondary | ICD-10-CM | POA: Insufficient documentation

## 2023-10-29 DIAGNOSIS — R4701 Aphasia: Secondary | ICD-10-CM | POA: Diagnosis present

## 2023-10-29 NOTE — Therapy (Signed)
 OUTPATIENT SPEECH LANGUAGE PATHOLOGY  TREATMENT    Patient Name: Jeffery Strong MRN: [id number] DOB:07-Dec-1940, 83 y.o., male Today's Date: 10/29/2023  PCP: Jeffery Oneil FALCON, MD  REFERRING PROVIDER: Sheena Pod MD     End of Session - 10/29/23 1400     Visit Number 11    Number of Visits 25    Date for SLP Re-Evaluation 12/17/23    SLP Start Time 1400    SLP Stop Time  1445    SLP Time Calculation (min) 45 min    Activity Tolerance Patient tolerated treatment well           Patient Active Problem List   Diagnosis Date Noted   Benign prostatic hyperplasia with lower urinary tract symptoms 02/17/2020   Benign essential hypertension 11/24/2019   History of CVA (cerebrovascular accident) 07/08/2018   Lacunar infarct, 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 06/30/2018   Left leg weakness 06/29/2018   Parkinson disease (HCC) 06/29/2018   CAD (coronary artery disease), native coronary artery 06/29/2018   HTN (hypertension) 06/29/2018   Medicare annual wellness visit, initial 07/02/2017   B12 deficiency 06/25/2016   Hyperlipidemia, mixed 06/13/2015   Dyslipidemia 10/07/2012   GAD (generalized anxiety disorder) 10/07/2012   Insomnia 10/07/2012   Major depression 09/23/2012   Convulsions (HCC) 04/12/2012    ONSET DATE: 09/11/23   REFERRING DIAG: CVA  THERAPY DIAG:  Aphasia  Cognitive communication deficit  Rationale for Evaluation and Treatment Rehabilitation  SUBJECTIVE:   SUBJECTIVE STATEMENT: Pt alert and cooperative.  Pt accompanied by: family member and [name] (wife) and son)   PERTINENT HISTORY: Jeffery Strong is a 83 y.o. male with past medical history including Parkinson's disease, prior CVA (07/08/2018), depression, HTN who presented to [facility] on 09/11/23 with aphasia and confusion. MRI showed Multiple small infarcts in the left anterior temporal and parietal lobes and left MCA-PCA watershed distribution. Evaluated by SLP during acute stay  with diet recommendation dysphagia IDDSI L6 soft and bite sized L0 Thin Liquids. SLP noted aphasia with primarily expressive deficits, but difficulty with complex comprehension. Previously followed by Dr. Maree with baseline cognitive deficits secondary to PD (SLUMS 25/30 in 2018), difficulty remembering names, memory deficits reported since 2014.  DIAGNOSTIC FINDINGS: See above  PAIN:  Are you having pain? No   FALLS: Has patient fallen in last 6 months?  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 Will plan to trial in upcoming sessions. Reviewed rationale for trial, expectations, and role of SGD in supporting verbal communication.   Pt answered questions about pictured actions (f=2 with 60%% accuracy with repetition; f=3 with 60% accuracy with repetition). Difficulty getting into set today.   Pt endorsed feeling sleepy. Discussed role of sleep/fatigue in cognitive-communication performance.   Increasing islands of error free speech noted during informal, supported conversations. SLP demo'd supported communication throughout session for pt's wife and son. Pt copied biographical targets with error awareness with inconsistent ability to repair ~70% of the time.   PATIENT EDUCATION: Education details: See today's treatment for details Person educated: patient, wife, and son Education method: Explanation Education comprehension: verbalized  understanding and needs further education  HOME EXERCISE PROGRAM:        Work on Y/N, counting, written cues/total communication, writing   GOALS:  Goals reviewed with  patient? Yes      SHORT TERM GOALS: Target date: 10 sessions  Patient will accurately respond to simple Y/N questions 80% accuracy with external supports as needed. Baseline: Goal status: MET; continue for consistency   2.  Patient will complete standardized assessment of language (and cognition, as appropriate). Baseline:  Goal status: MET  3.  Patient will demonstrate awareness of communication breakdowns in 3/5 opportunities with usual moderate cues. Baseline:  Goal status: MET    LONG TERM GOALS: Target date: 12/17/23  With Moderate A, patient will verify messages when written keyword with yes/no question is provided in conversation with 80% accuracy to reduce communication breakdowns. Baseline:  Goal status: INITIAL  2.  With Mod I, patient/family will demonstrate understanding of the following concepts: aphasia, spontaneous recovery, communication vs conversation, strengths/strategies to promote success, local resources by answering multiple choice questions with 90% accuracy when provided supported conversation in order to increase patient's participation in medical care. Baseline:  Goal status: INITIAL  3.  Patient will effectively communicate wants/needs via multimodal means >80% accuracy with assistance from communication partners in order to improve expressive language. Baseline:  Goal status: INITIAL    ASSESSMENT:  CLINICAL IMPRESSION: Patient is an 83 y.o. male who was seen today for aphasia treatment in setting of recent stroke.   Based on initial assessment, Patient presents with severe Wernicke's aphasia impacting both expressive and receptive language. See details of tx session above. Patient would benefit from skilled ST to address language and cognitive impairments in order to increase ability to communicate wants and needs, follow commands, for increased safety and improved social connection with family and friends. Caregiver involvement likely necessary to  make functional gains given the severity of client's deficits and his reduced awareness.  OBJECTIVE IMPAIRMENTS include attention, memory, awareness, expressive language, receptive language, and aphasia. These impairments are limiting patient from return to work, managing medications, managing appointments, managing finances, household responsibilities, ADLs/IADLs, and effectively communicating at home and in community. Factors affecting potential to achieve goals and functional outcome are ability to learn/carryover information, co-morbidities, cooperation/participation level, severity of impairments, and family/community support (+). Patient will benefit from skilled SLP services to address above impairments and improve overall function.  REHAB POTENTIAL: Good *with caregiver support  PLAN: SLP FREQUENCY: 2x/week  SLP DURATION: 12 weeks  PLANNED INTERVENTIONS: Cognitive reorganization, Internal/external aids, Functional tasks, Multimodal communication approach, SLP instruction and feedback, Compensatory strategies, Patient/family education, and 07492 Treatment of speech (30 or 45 min)    Delon Bangs, M.S., CCC-SLP Speech-Language Pathologist [place] - [facility] [phone] (ASCOM)

## 2023-10-31 ENCOUNTER — Ambulatory Visit: Admitting: Physical Therapy

## 2023-10-31 ENCOUNTER — Ambulatory Visit

## 2023-10-31 DIAGNOSIS — R41841 Cognitive communication deficit: Secondary | ICD-10-CM

## 2023-10-31 DIAGNOSIS — R4701 Aphasia: Secondary | ICD-10-CM | POA: Diagnosis not present

## 2023-10-31 NOTE — Therapy (Signed)
 OUTPATIENT SPEECH LANGUAGE PATHOLOGY  TREATMENT    Patient Name: Jeffery Strong MRN: [id number] DOB:Jun 06, 1940, 83 y.o., male Today's Date: 10/31/2023  PCP: Jeffery Oneil FALCON, MD  REFERRING PROVIDER: Sheena Pod MD     End of Session - 10/31/23 1523     Visit Number 12    Number of Visits 25    Date for SLP Re-Evaluation 12/17/23    SLP Start Time 1525    SLP Stop Time  1610    SLP Time Calculation (min) 45 min    Activity Tolerance Patient tolerated treatment well           Patient Active Problem List   Diagnosis Date Noted   Benign prostatic hyperplasia with lower urinary tract symptoms 02/17/2020   Benign essential hypertension 11/24/2019   History of CVA (cerebrovascular accident) 07/08/2018   Lacunar infarct, 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 06/30/2018   Left leg weakness 06/29/2018   Parkinson disease (HCC) 06/29/2018   CAD (coronary artery disease), native coronary artery 06/29/2018   HTN (hypertension) 06/29/2018   Medicare annual wellness visit, initial 07/02/2017   B12 deficiency 06/25/2016   Hyperlipidemia, mixed 06/13/2015   Dyslipidemia 10/07/2012   GAD (generalized anxiety disorder) 10/07/2012   Insomnia 10/07/2012   Major depression 09/23/2012   Convulsions (HCC) 04/12/2012    ONSET DATE: 09/11/23   REFERRING DIAG: CVA  THERAPY DIAG:  Aphasia  Cognitive communication deficit  Rationale for Evaluation and Treatment Rehabilitation  SUBJECTIVE:   SUBJECTIVE STATEMENT: Pt alert and cooperative.  Pt accompanied by: family member and Jeffery Strong (wife) and son)   PERTINENT HISTORY: Jeffery Strong is a 83 y.o. male with past medical history including Parkinson's disease, prior CVA (07/08/2018), depression, HTN who presented to [facility] on 09/11/23 with aphasia and confusion. MRI showed Multiple small infarcts in the left anterior temporal and parietal lobes and left MCA-PCA watershed distribution. Evaluated by SLP during acute stay  with diet recommendation dysphagia IDDSI L6 soft and bite sized L0 Thin Liquids. SLP noted aphasia with primarily expressive deficits, but difficulty with complex comprehension. Previously followed by Dr. Maree with baseline cognitive deficits secondary to PD (SLUMS 25/30 in 2018), difficulty remembering names, memory deficits reported since 2014.  DIAGNOSTIC FINDINGS: See above  PAIN:  Are you having pain? No   FALLS: Has patient fallen in last 6 months?  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 Will plan to trial in upcoming sessions. Reviewed rationale for trial, expectations, and role of SGD in supporting verbal communication.   Pt answered basic yes/no questions with 90% accuracy when given written key words. Pt ID pictured actions/objects from f=2 with 90% accuracy, f=3 with 70% accuracy. Pt ID objects from f=3 with >90% accuracy.   Increasing islands of error free speech noted during informal, supported conversations. SLP demo'd supported communication throughout session for pt's wife and daughter-in-law.   PATIENT EDUCATION: Education details: See today's treatment for details Person educated: patient, wife, and son Education method: Explanation Education comprehension: verbalized understanding and needs further education  HOME EXERCISE PROGRAM:        Work on  Y/N, counting, written cues/total communication, writing   GOALS:  Goals reviewed with patient? Yes      SHORT TERM GOALS: Target date: 10 sessions  Patient will accurately respond to simple Y/N questions 80% accuracy with external  supports as needed. Baseline: Goal status: MET; continue for consistency   2.  Patient will complete standardized assessment of language (and cognition, as appropriate). Baseline:  Goal status: MET  3.  Patient will demonstrate awareness of communication breakdowns in 3/5 opportunities with usual moderate cues. Baseline:  Goal status: MET    LONG TERM GOALS: Target date: 12/17/23  With Moderate A, patient will verify messages when written keyword with yes/no question is provided in conversation with 80% accuracy to reduce communication breakdowns. Baseline:  Goal status: INITIAL  2.  With Mod I, patient/family will demonstrate understanding of the following concepts: aphasia, spontaneous recovery, communication vs conversation, strengths/strategies to promote success, local resources by answering multiple choice questions with 90% accuracy when provided supported conversation in order to increase patient's participation in medical care. Baseline:  Goal status: INITIAL  3.  Patient will effectively communicate wants/needs via multimodal means >80% accuracy with assistance from communication partners in order to improve expressive language. Baseline:  Goal status: INITIAL    ASSESSMENT:  CLINICAL IMPRESSION: Patient is an 83 y.o. male who was seen today for aphasia treatment in setting of recent stroke.   Based on initial assessment, Patient presents with severe Wernicke's aphasia impacting both expressive and receptive language. See details of tx session above. Patient would benefit from skilled ST to address language and cognitive impairments in order to increase ability to communicate wants and needs, follow commands, for increased safety and improved social connection with family and friends. Caregiver involvement likely necessary to make functional gains given the severity of client's deficits and his reduced awareness.  OBJECTIVE IMPAIRMENTS include attention, memory,  awareness, expressive language, receptive language, and aphasia. These impairments are limiting patient from return to work, managing medications, managing appointments, managing finances, household responsibilities, ADLs/IADLs, and effectively communicating at home and in community. Factors affecting potential to achieve goals and functional outcome are ability to learn/carryover information, co-morbidities, cooperation/participation level, severity of impairments, and family/community support (+). Patient will benefit from skilled SLP services to address above impairments and improve overall function.  REHAB POTENTIAL: Good *with caregiver support  PLAN: SLP FREQUENCY: 2x/week  SLP DURATION: 12 weeks  PLANNED INTERVENTIONS: Cognitive reorganization, Internal/external aids, Functional tasks, Multimodal communication approach, SLP instruction and feedback, Compensatory strategies, Patient/family education, and 07492 Treatment of speech (30 or 45 min)    Delon Bangs, M.S., CCC-SLP Speech-Language Pathologist [place] - [facility] [phone] (ASCOM)

## 2023-11-05 ENCOUNTER — Ambulatory Visit

## 2023-11-05 DIAGNOSIS — R4701 Aphasia: Secondary | ICD-10-CM

## 2023-11-05 DIAGNOSIS — R41841 Cognitive communication deficit: Secondary | ICD-10-CM

## 2023-11-05 NOTE — Therapy (Signed)
 OUTPATIENT SPEECH LANGUAGE PATHOLOGY  TREATMENT    Patient Name: Jeffery Strong MRN: [id number] DOB:[birth year], 83 y.o., male Today's Date: 11/05/2023  PCP: Cleotilde Oneil FALCON, MD  REFERRING PROVIDER: Sheena Pod MD     End of Session - 11/05/23 1629     Visit Number 13    Number of Visits 25    Date for SLP Re-Evaluation 12/17/23    SLP Start Time 1446    SLP Stop Time  1540    SLP Time Calculation (min) 54 min    Activity Tolerance Patient tolerated treatment well           Patient Active Problem List   Diagnosis Date Noted   Benign prostatic hyperplasia with lower urinary tract symptoms 02/17/2020   Benign essential hypertension 11/24/2019   History of CVA (cerebrovascular accident) 07/08/2018   Lacunar infarct, 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 06/30/2018   Left leg weakness 06/29/2018   Parkinson disease (HCC) 06/29/2018   CAD (coronary artery disease), native coronary artery 06/29/2018   HTN (hypertension) 06/29/2018   Medicare annual wellness visit, initial 07/02/2017   B12 deficiency 06/25/2016   Hyperlipidemia, mixed 06/13/2015   Dyslipidemia 10/07/2012   GAD (generalized anxiety disorder) 10/07/2012   Insomnia 10/07/2012   Major depression 09/23/2012   Convulsions (HCC) 04/12/2012    ONSET DATE: 09/11/23   REFERRING DIAG: CVA  THERAPY DIAG:  Aphasia  Cognitive communication deficit  Rationale for Evaluation and Treatment Rehabilitation  SUBJECTIVE:   SUBJECTIVE STATEMENT: Pt alert and cooperative.  Pt accompanied by: family member and [name] (wife) and son)   PERTINENT HISTORY: Jeffery Strong is a 83 y.o. male with past medical history including Parkinson's disease, prior CVA (07/08/2018), depression, HTN who presented to [facility] on 09/11/23 with aphasia and confusion. MRI showed Multiple small infarcts in the left anterior temporal and parietal lobes and left MCA-PCA watershed distribution. Evaluated by SLP during acute stay  with diet recommendation dysphagia IDDSI L6 soft and bite sized L0 Thin Liquids. SLP noted aphasia with primarily expressive deficits, but difficulty with complex comprehension. Previously followed by Dr. Maree with baseline cognitive deficits secondary to PD (SLUMS 25/30 in 2018), difficulty remembering names, memory deficits reported since 2014.  DIAGNOSTIC FINDINGS: See above  PAIN:  Are you having pain? No   FALLS: Has patient fallen in last 6 months?  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 Pt answered basic yes/no questions with ~80% accuracy with written prompts. Pt also participated in conversation about topic of interest. Pt benefits from key words and confirmation of messages. Pt able to write name on SGD with mod A for use of stylus. Pt answered 5/5 biographical questions with use of total communication, including SGD. P   SGD device programming/modification: Pt  received SGD. Basic orientation including how to turn on/off device, volume controls, tabs, initiated. Refinroacement of content needed. Pt able to answer bio questions with min/mod A to utilize SGD. Timeline for SGD trial discussed and family given paperwork to complete for SGD customizations. Pt and  family encouraged to play on SGD.   PATIENT EDUCATION: Education details: See today's treatment for details Person educated: patient, wife, and adult child Education method: Explanation Education comprehension: verbalized understanding and needs further  education  HOME EXERCISE PROGRAM:        Play on SGD   GOALS:  Goals reviewed with patient? Yes      SHORT TERM GOALS: Target date: 10 sessions  Patient will accurately respond to simple Y/N questions 80% accuracy with external supports as needed. Baseline: Goal status: MET; continue for consistency   2.  Patient will complete standardized assessment of language (and cognition, as appropriate). Baseline:  Goal status: MET  3.  Patient will demonstrate awareness of communication breakdowns in 3/5 opportunities with usual moderate cues. Baseline:  Goal status: MET    LONG TERM GOALS: Target date: 12/17/23  With Moderate A, patient will verify messages when written keyword with yes/no question is provided in conversation with 80% accuracy to reduce communication breakdowns. Baseline:  Goal status: INITIAL  2.  With Mod I, patient/family will demonstrate understanding of the following concepts: aphasia, spontaneous recovery, communication vs conversation, strengths/strategies to promote success, local resources by answering multiple choice questions with 90% accuracy when provided supported conversation in order to increase patient's participation in medical care. Baseline:  Goal status: INITIAL  3.  Patient will effectively communicate wants/needs via multimodal means >80% accuracy with assistance from communication partners in order to improve expressive language. Baseline:  Goal status: INITIAL    ASSESSMENT:  CLINICAL IMPRESSION: Patient is an 83 y.o. male who was seen today for aphasia treatment in setting of recent stroke.   Based on initial assessment, Patient presents with severe Wernicke's aphasia impacting both expressive and receptive language. See details of tx session above. Patient would benefit from skilled ST to address language and cognitive impairments in order to increase ability to communicate wants and needs, follow commands, for increased safety and  improved social connection with family and friends. Caregiver involvement likely necessary to make functional gains given the severity of client's deficits and his reduced awareness.  OBJECTIVE IMPAIRMENTS include attention, memory, awareness, expressive language, receptive language, and aphasia. These impairments are limiting patient from return to work, managing medications, managing appointments, managing finances, household responsibilities, ADLs/IADLs, and effectively communicating at home and in community. Factors affecting potential to achieve goals and functional outcome are ability to learn/carryover information, co-morbidities, cooperation/participation level, severity of impairments, and family/community support (+). Patient will benefit from skilled SLP services to address above impairments and improve overall function.  REHAB POTENTIAL: Good *with caregiver support  PLAN: SLP FREQUENCY: 2x/week  SLP DURATION: 12 weeks  PLANNED INTERVENTIONS: Cognitive reorganization, Internal/external aids, Functional tasks, Multimodal communication approach, SLP instruction and feedback, Compensatory strategies, Patient/family education, and 07492 Treatment of speech (30 or 45 min)    Delon Bangs, M.S., CCC-SLP Speech-Language Pathologist [place] - [facility] [phone] (ASCOM)

## 2023-11-07 ENCOUNTER — Ambulatory Visit

## 2023-11-07 ENCOUNTER — Encounter

## 2023-11-07 DIAGNOSIS — R4701 Aphasia: Secondary | ICD-10-CM | POA: Diagnosis not present

## 2023-11-07 NOTE — Therapy (Signed)
 OUTPATIENT SPEECH LANGUAGE PATHOLOGY  TREATMENT    Patient Name: Jeffery Strong MRN: [id number] DOB:[birth year], 83 y.o., male Today's Date: 11/07/2023  PCP: Cleotilde Oneil FALCON, MD  REFERRING PROVIDER: Sheena Pod MD     End of Session - 11/07/23 1057     Visit Number 14    Number of Visits 25    Date for SLP Re-Evaluation 12/17/23    SLP Start Time 1015    SLP Stop Time  1100    SLP Time Calculation (min) 45 min    Activity Tolerance Patient tolerated treatment well           Patient Active Problem List   Diagnosis Date Noted   Benign prostatic hyperplasia with lower urinary tract symptoms 02/17/2020   Benign essential hypertension 11/24/2019   History of CVA (cerebrovascular accident) 07/08/2018   Lacunar infarct, 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 06/30/2018   Left leg weakness 06/29/2018   Parkinson disease (HCC) 06/29/2018   CAD (coronary artery disease), native coronary artery 06/29/2018   HTN (hypertension) 06/29/2018   Medicare annual wellness visit, initial 07/02/2017   B12 deficiency 06/25/2016   Hyperlipidemia, mixed 06/13/2015   Dyslipidemia 10/07/2012   GAD (generalized anxiety disorder) 10/07/2012   Insomnia 10/07/2012   Major depression 09/23/2012   Convulsions (HCC) 04/12/2012    ONSET DATE: 09/11/23   REFERRING DIAG: CVA  THERAPY DIAG:  Aphasia  Rationale for Evaluation and Treatment Rehabilitation  SUBJECTIVE:   SUBJECTIVE STATEMENT: Pt alert and cooperative.  Pt accompanied by: family member and [name] (wife) and son)   PERTINENT HISTORY: Jeffery Strong is a 83 y.o. male with past medical history including Parkinson's disease, prior CVA (07/08/2018), depression, HTN who presented to [facility] on 09/11/23 with aphasia and confusion. MRI showed Multiple small infarcts in the left anterior temporal and parietal lobes and left MCA-PCA watershed distribution. Evaluated by SLP during acute stay with diet recommendation dysphagia  IDDSI L6 soft and bite sized L0 Thin Liquids. SLP noted aphasia with primarily expressive deficits, but difficulty with complex comprehension. Previously followed by Dr. Maree with baseline cognitive deficits secondary to PD (SLUMS 25/30 in 2018), difficulty remembering names, memory deficits reported since 2014.  DIAGNOSTIC FINDINGS: See above  PAIN:  Are you having pain? No   FALLS: Has patient fallen in last 6 months?  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 Pt also participated in conversation about topic of interest. Pt benefits from key words and confirmation of messages. Pt able to write name on SGD with mod A for use of stylus. Pt answered 5/5 biographical questions with use of total communication, including SGD, with min/mod A. Using SGD, pt able to select preferred meal from one of his favorite restaurants. Emerging repetition of targets noted.    SGD device programming/modification: Pt  received SGD. Reviewed orientation including how to turn on/off device, volume controls, tabs, Pt able to answer bio questions with min/mod A to utilize SGD. Added several items to SGD based on family intake form. Will plan to forward form to [employer] for further  customization. Pt and family encouraged to play on SGD and given a scavenger hunt form for practicing SGD use.   PATIENT EDUCATION: Education details: See today's treatment for details Person educated: patient, wife, and adult child Education method: Explanation Education comprehension: verbalized  understanding and needs further education  HOME EXERCISE PROGRAM:        Play on SGD   GOALS:  Goals reviewed with patient? Yes      SHORT TERM GOALS: Target date: 10 sessions  Patient will accurately respond to simple Y/N questions 80% accuracy with external supports as needed. Baseline: Goal status: MET; continue for consistency   2.  Patient will complete standardized assessment of language (and cognition, as appropriate). Baseline:  Goal status: MET  3.  Patient will demonstrate awareness of communication breakdowns in 3/5 opportunities with usual moderate cues. Baseline:  Goal status: MET    LONG TERM GOALS: Target date: 12/17/23  With Moderate A, patient will verify messages when written keyword with yes/no question is provided in conversation with 80% accuracy to reduce communication breakdowns. Baseline:  Goal status: INITIAL  2.  With Mod I, patient/family will demonstrate understanding of the following concepts: aphasia, spontaneous recovery, communication vs conversation, strengths/strategies to promote success, local resources by answering multiple choice questions with 90% accuracy when provided supported conversation in order to increase patient's participation in medical care. Baseline:  Goal status: INITIAL  3.  Patient will effectively communicate wants/needs via multimodal means >80% accuracy with assistance from communication partners in order to improve expressive language. Baseline:  Goal status: INITIAL    ASSESSMENT:  CLINICAL IMPRESSION: Patient is an 83 y.o. male who was seen today for aphasia treatment in setting of recent stroke.   Based on initial assessment, Patient presents with severe Wernicke's aphasia impacting both expressive and receptive language. See details of tx session above. Patient would benefit from skilled ST to address language and cognitive impairments in order to increase ability to communicate wants and needs, follow  commands, for increased safety and improved social connection with family and friends. Caregiver involvement likely necessary to make functional gains given the severity of client's deficits and his reduced awareness.  OBJECTIVE IMPAIRMENTS include attention, memory, awareness, expressive language, receptive language, and aphasia. These impairments are limiting patient from return to work, managing medications, managing appointments, managing finances, household responsibilities, ADLs/IADLs, and effectively communicating at home and in community. Factors affecting potential to achieve goals and functional outcome are ability to learn/carryover information, co-morbidities, cooperation/participation level, severity of impairments, and family/community support (+). Patient will benefit from skilled SLP services to address above impairments and improve overall function.  REHAB POTENTIAL: Good *with caregiver support  PLAN: SLP FREQUENCY: 2x/week  SLP DURATION: 12 weeks  PLANNED INTERVENTIONS: Cognitive reorganization, Internal/external aids, Functional tasks, Multimodal communication approach, SLP instruction and feedback, Compensatory strategies, Patient/family education, and 07492 Treatment of speech (30 or 45 min)    Delon Bangs, M.S., CCC-SLP Speech-Language Pathologist [place] - [facility] [phone] (ASCOM)

## 2023-11-12 ENCOUNTER — Ambulatory Visit

## 2023-11-12 DIAGNOSIS — R4701 Aphasia: Secondary | ICD-10-CM

## 2023-11-12 NOTE — Therapy (Signed)
 OUTPATIENT SPEECH LANGUAGE PATHOLOGY  TREATMENT    Patient Name: Jeffery Strong MRN: [id number] DOB:1940-06-16, 83 y.o., male Today's Date: 11/12/2023  PCP: Cleotilde Oneil FALCON, MD  REFERRING PROVIDER: Sheena Pod MD     End of Session - 11/12/23 1500     Visit Number 15    Number of Visits 25    Date for SLP Re-Evaluation 12/17/23    SLP Start Time 1315    SLP Stop Time  1400    SLP Time Calculation (min) 45 min    Activity Tolerance Patient tolerated treatment well           Patient Active Problem List   Diagnosis Date Noted   Benign prostatic hyperplasia with lower urinary tract symptoms 02/17/2020   Benign essential hypertension 11/24/2019   History of CVA (cerebrovascular accident) 07/08/2018   Lacunar infarct, 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 06/30/2018   Left leg weakness 06/29/2018   Parkinson disease (HCC) 06/29/2018   CAD (coronary artery disease), native coronary artery 06/29/2018   HTN (hypertension) 06/29/2018   Medicare annual wellness visit, initial 07/02/2017   B12 deficiency 06/25/2016   Hyperlipidemia, mixed 06/13/2015   Dyslipidemia 10/07/2012   GAD (generalized anxiety disorder) 10/07/2012   Insomnia 10/07/2012   Major depression 09/23/2012   Convulsions (HCC) 04/12/2012    ONSET DATE: 09/11/23   REFERRING DIAG: CVA  THERAPY DIAG:  Aphasia  Rationale for Evaluation and Treatment Rehabilitation  SUBJECTIVE:   SUBJECTIVE STATEMENT: Pt alert and cooperative.  Pt accompanied by: family member and [name] (wife) and son)   PERTINENT HISTORY: Jeffery Strong is a 83 y.o. male with past medical history including Parkinson's disease, prior CVA (07/08/2018), depression, HTN who presented to [facility] on 09/11/23 with aphasia and confusion. MRI showed Multiple small infarcts in the left anterior temporal and parietal lobes and left MCA-PCA watershed distribution. Evaluated by SLP during acute stay with diet recommendation dysphagia  IDDSI L6 soft and bite sized L0 Thin Liquids. SLP noted aphasia with primarily expressive deficits, but difficulty with complex comprehension. Previously followed by Dr. Maree with baseline cognitive deficits secondary to PD (SLUMS 25/30 in 2018), difficulty remembering names, memory deficits reported since 2014.  DIAGNOSTIC FINDINGS: See above  PAIN:  Are you having pain? No   FALLS: Has patient fallen in last 6 months?  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 Pt also participated in conversation about topic of interest. Pt benefits from key words and confirmation of messages. Pt able to write name on SGD with mod A for use of stylus which improved to min A with use of elongated stylus. Pt answered 5/5 biographical questions with use of total communication, including SGD, with min/mod A. Increased repetition of targets.    SGD device programming/modification: Pt  received SGD. Reviewed orientation including how to turn on/off device, volume controls, tabs. Pt toggled speech read aloud off by accident over the weekend, family shown how to fix. Pt and family shown card [employer] for options during communication exchanges as well as repetition task. Pt able to answer  bio questions with min/mod A to utilize SGD. Added yes/no folder to help facilitate yes/no questions.   PATIENT EDUCATION: Education details: See today's treatment for details Person educated: patient, wife, and adult child Education method: Explanation Education comprehension: verbalized  understanding and needs further education  HOME EXERCISE PROGRAM:        Play on SGD  Use yes/no folder   Repeat targets   GOALS:  Goals reviewed with patient? Yes      SHORT TERM GOALS: Target date: 10 sessions  Patient will accurately respond to simple Y/N questions 80% accuracy with external supports as needed. Baseline: Goal status: MET; continue for consistency   2.  Patient will complete standardized assessment of language (and cognition, as appropriate). Baseline:  Goal status: MET  3.  Patient will demonstrate awareness of communication breakdowns in 3/5 opportunities with usual moderate cues. Baseline:  Goal status: MET    LONG TERM GOALS: Target date: 12/17/23  With Moderate A, patient will verify messages when written keyword with yes/no question is provided in conversation with 80% accuracy to reduce communication breakdowns. Baseline:  Goal status: INITIAL  2.  With Mod I, patient/family will demonstrate understanding of the following concepts: aphasia, spontaneous recovery, communication vs conversation, strengths/strategies to promote success, local resources by answering multiple choice questions with 90% accuracy when provided supported conversation in order to increase patient's participation in medical care. Baseline:  Goal status: INITIAL  3.  Patient will effectively communicate wants/needs via multimodal means >80% accuracy with assistance from communication partners in order to improve expressive language. Baseline:  Goal status: INITIAL    ASSESSMENT:  CLINICAL IMPRESSION: Patient is an 83 y.o. male who was seen today for aphasia treatment in setting of recent stroke.   Based on initial assessment, Patient presents with severe Wernicke's aphasia impacting both expressive and receptive language. See details of tx session above. Patient would benefit from skilled ST to address language and cognitive impairments in order to increase ability to  communicate wants and needs, follow commands, for increased safety and improved social connection with family and friends. Caregiver involvement likely necessary to make functional gains given the severity of client's deficits and his reduced awareness.  OBJECTIVE IMPAIRMENTS include attention, memory, awareness, expressive language, receptive language, and aphasia. These impairments are limiting patient from return to work, managing medications, managing appointments, managing finances, household responsibilities, ADLs/IADLs, and effectively communicating at home and in community. Factors affecting potential to achieve goals and functional outcome are ability to learn/carryover information, co-morbidities, cooperation/participation level, severity of impairments, and family/community support (+). Patient will benefit from skilled SLP services to address above impairments and improve overall function.  REHAB POTENTIAL: Good *with caregiver support  PLAN: SLP FREQUENCY: 2x/week  SLP DURATION: 12 weeks  PLANNED INTERVENTIONS: Cognitive reorganization, Internal/external aids, Functional tasks, Multimodal communication approach, SLP instruction and feedback, Compensatory strategies, Patient/family education, and 07492 Treatment of speech (30 or 45 min)    Delon Bangs, M.S., CCC-SLP Speech-Language Pathologist [place] [facility] [phone] (ASCOM)

## 2023-11-14 ENCOUNTER — Ambulatory Visit

## 2023-11-14 DIAGNOSIS — R4701 Aphasia: Secondary | ICD-10-CM

## 2023-11-14 NOTE — Therapy (Signed)
 OUTPATIENT SPEECH LANGUAGE PATHOLOGY  TREATMENT    Patient Name: Jeffery Strong MRN: [id number] DOB:[birth year], 83 y.o., male Today's Date: 11/14/2023  PCP: Jeffery Strong FALCON, MD  REFERRING PROVIDER: Sheena Pod MD     End of Session - 11/14/23 1437     Visit Number 16    Number of Visits 25    Date for Recertification  12/17/23    SLP Start Time 1436    SLP Stop Time  1518    SLP Time Calculation (min) 42 min    Activity Tolerance Patient tolerated treatment well           Patient Active Problem List   Diagnosis Date Noted   Benign prostatic hyperplasia with lower urinary tract symptoms 02/17/2020   Benign essential hypertension 11/24/2019   History of CVA (cerebrovascular accident) 07/08/2018   Lacunar infarct, 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 06/30/2018   Left leg weakness 06/29/2018   Parkinson disease (HCC) 06/29/2018   CAD (coronary artery disease), native coronary artery 06/29/2018   HTN (hypertension) 06/29/2018   Medicare annual wellness visit, initial 07/02/2017   B12 deficiency 06/25/2016   Hyperlipidemia, mixed 06/13/2015   Dyslipidemia 10/07/2012   GAD (generalized anxiety disorder) 10/07/2012   Insomnia 10/07/2012   Major depression 09/23/2012   Convulsions (HCC) 04/12/2012    ONSET DATE: 09/11/23   REFERRING DIAG: CVA  THERAPY DIAG:  Aphasia  Rationale for Evaluation and Treatment Rehabilitation  SUBJECTIVE:   SUBJECTIVE STATEMENT: Pt alert and cooperative.  Pt accompanied by: family member and [name] (wife) and son)   PERTINENT HISTORY: Jeffery Strong is a 83 y.o. male with past medical history including Parkinson's disease, prior CVA (07/08/2018), depression, HTN who presented to [facility] on 09/11/23 with aphasia and confusion. MRI showed Multiple small infarcts in the left anterior temporal and parietal lobes and left MCA-PCA watershed distribution. Evaluated by SLP during acute stay with diet recommendation dysphagia  IDDSI L6 soft and bite sized L0 Thin Liquids. SLP noted aphasia with primarily expressive deficits, but difficulty with complex comprehension. Previously followed by Dr. Maree with baseline cognitive deficits secondary to PD (SLUMS 25/30 in 2018), difficulty remembering names, memory deficits reported since 2014.  DIAGNOSTIC FINDINGS: See above  PAIN:  Are you having pain? No   FALLS: Has patient fallen in last 6 months?  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 Pt also participated in conversation about topic of interest. Pt benefits from key words and confirmation of messages. Pt able to write name and other bio targets on SGD with mod A for use of stylus which improved to min A with use of elongated stylus; mod/max cues for spelling. Pt answered 5/5 biographical questions with use of total communication, including SGD, with min/mod A. Increased verbal repetition of targets noted throughout session.   PATIENT EDUCATION: Education details: See today's treatment for details Person educated: patient, wife, and adult child Education method: Explanation Education comprehension: verbalized understanding and needs further education  HOME EXERCISE PROGRAM:        Play  on SGD  Use yes/no folder   Repeat targets   GOALS:  Goals reviewed with patient? Yes      SHORT TERM GOALS: Target date: 10 sessions  Patient will accurately respond to simple Y/N questions 80% accuracy with external supports as needed. Baseline: Goal status: MET; continue  for consistency   2.  Patient will complete standardized assessment of language (and cognition, as appropriate). Baseline:  Goal status: MET  3.  Patient will demonstrate awareness of communication breakdowns in 3/5 opportunities with usual moderate cues. Baseline:  Goal status: MET    LONG TERM GOALS: Target date: 12/17/23  With Moderate A, patient will verify messages when written keyword with yes/no question is provided in conversation with 80% accuracy to reduce communication breakdowns. Baseline:  Goal status: INITIAL  2.  With Mod I, patient/family will demonstrate understanding of the following concepts: aphasia, spontaneous recovery, communication vs conversation, strengths/strategies to promote success, local resources by answering multiple choice questions with 90% accuracy when provided supported conversation in order to increase patient's participation in medical care. Baseline:  Goal status: INITIAL  3.  Patient will effectively communicate wants/needs via multimodal means >80% accuracy with assistance from communication partners in order to improve expressive language. Baseline:  Goal status: INITIAL    ASSESSMENT:  CLINICAL IMPRESSION: Patient is an 83 y.o. male who was seen today for aphasia treatment in setting of recent stroke.   Based on initial assessment, Patient presents with severe Wernicke's aphasia impacting both expressive and receptive language. See details of tx session above. Patient would benefit from skilled ST to address language and cognitive impairments in order to increase ability to communicate wants and needs, follow commands, for increased safety and improved social connection with family and friends. Caregiver involvement likely necessary to make functional gains given the severity of client's deficits and his reduced awareness.  OBJECTIVE IMPAIRMENTS include attention, memory, awareness, expressive language, receptive language, and aphasia.  These impairments are limiting patient from return to work, managing medications, managing appointments, managing finances, household responsibilities, ADLs/IADLs, and effectively communicating at home and in community. Factors affecting potential to achieve goals and functional outcome are ability to learn/carryover information, co-morbidities, cooperation/participation level, severity of impairments, and family/community support (+). Patient will benefit from skilled SLP services to address above impairments and improve overall function.  REHAB POTENTIAL: Good *with caregiver support  PLAN: SLP FREQUENCY: 2x/week  SLP DURATION: 12 weeks  PLANNED INTERVENTIONS: Cognitive reorganization, Internal/external aids, Functional tasks, Multimodal communication approach, SLP instruction and feedback, Compensatory strategies, Patient/family education, and 07492 Treatment of speech (30 or 45 min)    Delon Bangs, M.S., CCC-SLP Speech-Language Pathologist [place] [facility] [phone] (ASCOM)

## 2023-11-19 ENCOUNTER — Encounter: Admitting: Occupational Therapy

## 2023-11-19 ENCOUNTER — Ambulatory Visit

## 2023-11-19 DIAGNOSIS — R4701 Aphasia: Secondary | ICD-10-CM | POA: Diagnosis not present

## 2023-11-19 DIAGNOSIS — R41841 Cognitive communication deficit: Secondary | ICD-10-CM

## 2023-11-19 NOTE — Therapy (Signed)
 OUTPATIENT SPEECH LANGUAGE PATHOLOGY  TREATMENT    Patient Name: Jeffery Strong MRN: [id number] DOB:1940-05-15, 83 y.o., male Today's Date: 11/19/2023  PCP: Jeffery Oneil FALCON, MD  REFERRING PROVIDER: Sheena Pod MD     End of Session - 11/19/23 0958     Visit Number 17    Number of Visits 25    Date for Recertification  12/17/23    SLP Start Time 1000    SLP Stop Time  1045    SLP Time Calculation (min) 45 min    Activity Tolerance Patient tolerated treatment well           Patient Active Problem List   Diagnosis Date Noted   Benign prostatic hyperplasia with lower urinary tract symptoms 02/17/2020   Benign essential hypertension 11/24/2019   History of CVA (cerebrovascular accident) 07/08/2018   Lacunar infarct, 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 06/30/2018   Left leg weakness 06/29/2018   Parkinson disease (HCC) 06/29/2018   CAD (coronary artery disease), native coronary artery 06/29/2018   HTN (hypertension) 06/29/2018   Medicare annual wellness visit, initial 07/02/2017   B12 deficiency 06/25/2016   Hyperlipidemia, mixed 06/13/2015   Dyslipidemia 10/07/2012   GAD (generalized anxiety disorder) 10/07/2012   Insomnia 10/07/2012   Major depression 09/23/2012   Convulsions (HCC) 04/12/2012    ONSET DATE: 09/11/23   REFERRING DIAG: CVA  THERAPY DIAG:  Aphasia  Cognitive communication deficit  Rationale for Evaluation and Treatment Rehabilitation  SUBJECTIVE:   SUBJECTIVE STATEMENT: Pt alert and cooperative.  Pt accompanied by: family member and Jeffery Strong (wife) and adult child   PERTINENT HISTORY: Jeffery Strong is a 83 y.o. male with past medical history including Parkinson's disease, prior CVA (07/08/2018), depression, HTN who presented to [facility] on 09/11/23 with aphasia and confusion. MRI showed Multiple small infarcts in the left anterior temporal and parietal lobes and left MCA-PCA watershed distribution. Evaluated by SLP during acute  stay with diet recommendation dysphagia IDDSI L6 soft and bite sized L0 Thin Liquids. SLP noted aphasia with primarily expressive deficits, but difficulty with complex comprehension. Previously followed by Dr. Maree with baseline cognitive deficits secondary to PD (SLUMS 25/30 in 2018), difficulty remembering names, memory deficits reported since 2014.  DIAGNOSTIC FINDINGS: See above  PAIN:  Are you having pain? No   FALLS: Has patient fallen in last 6 months?  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 Pt also participated in conversation about topic of interest. Pt benefits from key words and confirmation of messages. Pt able to write name and other bio targets on SGD with mod A for use of stylus which improved to min A with use of elongated stylus; mod/max cues for spelling. Pt answered 5/5 biographical questions with use of total communication, including SGD, with min/mod A. Increased verbal repetition of targets noted throughout session as well as repetition to clarify/confirm messages. Increased islands and error free speech as well as increased initiation observed. Discussed importance of saliency in neuroplasticity/stroke recovery. Discussed activities pt can partake in with assistance to help  facilitate meaningful conversation.   PATIENT EDUCATION: Education details: See today's treatment for details Person educated: patient, wife, and adult child Education method: Explanation Education comprehension: verbalized understanding and needs further  education  HOME EXERCISE PROGRAM:        Play on SGD  Use yes/no folder   Repeat targets   GOALS:  Goals reviewed with patient? Yes      SHORT TERM GOALS: Target date: 10 sessions  Patient will accurately respond to simple Y/N questions 80% accuracy with external supports as needed. Baseline: Goal status: MET; continue for consistency   2.  Patient will complete standardized assessment of language (and cognition, as appropriate). Baseline:  Goal status: MET  3.  Patient will demonstrate awareness of communication breakdowns in 3/5 opportunities with usual moderate cues. Baseline:  Goal status: MET    LONG TERM GOALS: Target date: 12/17/23  With Moderate A, patient will verify messages when written keyword with yes/no question is provided in conversation with 80% accuracy to reduce communication breakdowns. Baseline:  Goal status: INITIAL  2.  With Mod I, patient/family will demonstrate understanding of the following concepts: aphasia, spontaneous recovery, communication vs conversation, strengths/strategies to promote success, local resources by answering multiple choice questions with 90% accuracy when provided supported conversation in order to increase patient's participation in medical care. Baseline:  Goal status: INITIAL  3.  Patient will effectively communicate wants/needs via multimodal means >80% accuracy with assistance from communication partners in order to improve expressive language. Baseline:  Goal status: INITIAL    ASSESSMENT:  CLINICAL IMPRESSION: Patient is an 83 y.o. male who was seen today for aphasia treatment in setting of recent stroke.   Based on initial assessment, Patient presents with severe Wernicke's aphasia impacting both expressive and receptive language. See details of tx session above. Patient would benefit from skilled ST to address language and cognitive impairments in order to increase ability to communicate wants and needs, follow  commands, for increased safety and improved social connection with family and friends. Caregiver involvement likely necessary to make functional gains given the severity of client's deficits and his reduced awareness.  OBJECTIVE IMPAIRMENTS include attention, memory, awareness, expressive language, receptive language, and aphasia. These impairments are limiting patient from return to work, managing medications, managing appointments, managing finances, household responsibilities, ADLs/IADLs, and effectively communicating at home and in community. Factors affecting potential to achieve goals and functional outcome are ability to learn/carryover information, co-morbidities, cooperation/participation level, severity of impairments, and family/community support (+). Patient will benefit from skilled SLP services to address above impairments and improve overall function.  REHAB POTENTIAL: Good *with caregiver support  PLAN: SLP FREQUENCY: 2x/week  SLP DURATION: 12 weeks  PLANNED INTERVENTIONS: Cognitive reorganization, Internal/external aids, Functional tasks, Multimodal communication approach, SLP instruction and feedback, Compensatory strategies, Patient/family education, and 07492 Treatment of speech (30 or 45 min)    Delon Bangs, M.S., CCC-SLP Speech-Language Pathologist [place] [facility] [phone] (ASCOM)

## 2023-11-21 ENCOUNTER — Ambulatory Visit

## 2023-11-21 DIAGNOSIS — R4701 Aphasia: Secondary | ICD-10-CM | POA: Diagnosis not present

## 2023-11-21 DIAGNOSIS — R41841 Cognitive communication deficit: Secondary | ICD-10-CM

## 2023-11-21 NOTE — Therapy (Signed)
 OUTPATIENT SPEECH LANGUAGE PATHOLOGY  TREATMENT    Patient Name: Jeffery Strong MRN: [id number] DOB:1940-06-30, 83 y.o., male Today's Date: 11/21/2023  PCP: Cleotilde Oneil FALCON, MD  REFERRING PROVIDER: Sheena Pod MD     End of Session - 11/21/23 1348     Visit Number 18    Number of Visits 25    Date for Recertification  12/17/23    SLP Start Time 1350    SLP Stop Time  1445    SLP Time Calculation (min) 55 min    Activity Tolerance Patient tolerated treatment well           Patient Active Problem List   Diagnosis Date Noted   Benign prostatic hyperplasia with lower urinary tract symptoms 02/17/2020   Benign essential hypertension 11/24/2019   History of CVA (cerebrovascular accident) 07/08/2018   Lacunar infarct, 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 06/30/2018   Left leg weakness 06/29/2018   Parkinson disease (HCC) 06/29/2018   CAD (coronary artery disease), native coronary artery 06/29/2018   HTN (hypertension) 06/29/2018   Medicare annual wellness visit, initial 07/02/2017   B12 deficiency 06/25/2016   Hyperlipidemia, mixed 06/13/2015   Dyslipidemia 10/07/2012   GAD (generalized anxiety disorder) 10/07/2012   Insomnia 10/07/2012   Major depression 09/23/2012   Convulsions (HCC) 04/12/2012    ONSET DATE: 09/11/23   REFERRING DIAG: CVA  THERAPY DIAG:  Aphasia  Cognitive communication deficit  Rationale for Evaluation and Treatment Rehabilitation  SUBJECTIVE:   SUBJECTIVE STATEMENT: Pt alert and cooperative.  Pt accompanied by: family member and Jeffery Strong (wife) and adult child   PERTINENT HISTORY: Jeffery Strong is a 83 y.o. male with past medical history including Parkinson's disease, prior CVA (07/08/2018), depression, HTN who presented to [facility] on 09/11/23 with aphasia and confusion. MRI showed Multiple small infarcts in the left anterior temporal and parietal lobes and left MCA-PCA watershed distribution. Evaluated by SLP during acute  stay with diet recommendation dysphagia IDDSI L6 soft and bite sized L0 Thin Liquids. SLP noted aphasia with primarily expressive deficits, but difficulty with complex comprehension. Previously followed by Dr. Maree with baseline cognitive deficits secondary to PD (SLUMS 25/30 in 2018), difficulty remembering names, memory deficits reported since 2014.  DIAGNOSTIC FINDINGS: See above  PAIN:  Are you having pain? No   FALLS: Has patient fallen in last 6 months?  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   PATIENT GOALS   (patient states nothing, spouse states we want to be able to understand him)  OBJECTIVE:   Re-assessment completed via WAB-R with scores as follows:   Western Aphasia Battery- Revised  Spontaneous Speech                           Information content               3/10                                            Fluency  7/10                                          Comprehension     Yes/No questions                 30/60                                           Auditory Word Recognition  28/60                                Sequential Commands       8/80                              Repetition                             26/100                                        Naming    Object Naming                     2/60                                           Word Fluency                        0/20                                            Sentence Completion          0/10                                             Responsive Speech              2/10                                         Aphasia Quotient                  32.6/100         Pt's severity rating was severe as indicated by an Aphasia Quotient of  32.6 (0-25=very severe, 26-50=severe, 51-75=moderate, 76 and above is mild). Pt's presentation is most consistent with Wernicke's subtype.  Speech/language tx: Pt answered basic yes/no questions with 100% accuracy with written prompts and use of SGD. Pt also participated in conversation about topic of interest. Pt benefits from key words and confirmation of messages. Increased verbal repetition of  targets noted throughout session as well as repetition to clarify/confirm messages. Increased islands and error free speech as well as increased initiation observed. Discussed importance of saliency in neuroplasticity/stroke recovery. Discussed activities pt can partake in with assistance to help facilitate meaningful conversation. Family to bring in pictures of vacations and other memorable moments for upcoming sessions.   PATIENT EDUCATION: Education details: See today's treatment for details Person educated: patient, wife, and adult child Education method: Explanation Education comprehension: verbalized understanding and needs further education  HOME EXERCISE PROGRAM:        Play on SGD  Use yes/no folder   Repeat targets   GOALS:  Goals reviewed with patient? Yes      SHORT TERM GOALS: Target date: 10 sessions  Patient will accurately respond to simple Y/N questions 80% accuracy with external supports as needed. Baseline: Goal status: MET; continue for consistency   2.  Patient will complete standardized assessment of language (and cognition, as appropriate). Baseline:  Goal status: MET  3.  Patient will demonstrate awareness of communication breakdowns in 3/5 opportunities with usual moderate cues. Baseline:  Goal status: MET     LONG TERM GOALS: Target date: 12/17/23  With Moderate A, patient will verify messages when written keyword with yes/no question is provided in conversation with 80% accuracy to reduce communication breakdowns. Baseline:  Goal status: INITIAL  2.   With Mod I, patient/family will demonstrate understanding of the following concepts: aphasia, spontaneous recovery, communication vs conversation, strengths/strategies to promote success, local resources by answering multiple choice questions with 90% accuracy when provided supported conversation in order to increase patient's participation in medical care. Baseline:  Goal status: INITIAL  3.  Patient will effectively communicate wants/needs via multimodal means >80% accuracy with assistance from communication partners in order to improve expressive language. Baseline:  Goal status: INITIAL    ASSESSMENT:  CLINICAL IMPRESSION: Patient is an 83 y.o. male who was seen today for aphasia treatment in setting of recent stroke.   Based on initial assessment, Patient presents with severe Wernicke's aphasia impacting both expressive and receptive language. See details of tx session above. Patient would benefit from skilled ST to address language and cognitive impairments in order to increase ability to communicate wants and needs, follow commands, for increased safety and improved social connection with family and friends. Caregiver involvement likely necessary to make functional gains given the severity of client's deficits and his reduced awareness.  OBJECTIVE IMPAIRMENTS include attention, memory, awareness, expressive language, receptive language, and aphasia. These impairments are limiting patient from return to work, managing medications, managing appointments, managing finances, household responsibilities, ADLs/IADLs, and effectively communicating at home and in community. Factors affecting potential to achieve goals and functional outcome are ability to learn/carryover information, co-morbidities, cooperation/participation level, severity of impairments, and family/community support (+). Patient will benefit from skilled SLP services to address above impairments and improve overall function.  REHAB  POTENTIAL: Good *with caregiver support  PLAN: SLP FREQUENCY: 2x/week  SLP DURATION: 12 weeks  PLANNED INTERVENTIONS: Cognitive reorganization, Internal/external aids, Functional tasks, Multimodal communication approach, SLP instruction and feedback, Compensatory strategies, Patient/family education, and 07492 Treatment of speech (30 or 45 min)    Delon Bangs, M.S., CCC-SLP Speech-Language Pathologist [place] - [facility] [phone] (ASCOM)

## 2023-11-26 ENCOUNTER — Ambulatory Visit

## 2023-11-26 ENCOUNTER — Encounter: Admitting: Occupational Therapy

## 2023-11-26 DIAGNOSIS — R4701 Aphasia: Secondary | ICD-10-CM | POA: Diagnosis not present

## 2023-11-26 NOTE — Therapy (Signed)
 OUTPATIENT SPEECH LANGUAGE PATHOLOGY  TREATMENT    Patient Name: Jeffery Strong MRN: [id number] DOB:09-Sep-1940, 83 y.o., male Today's Date: 11/26/2023  PCP: Cleotilde Oneil FALCON, MD  REFERRING PROVIDER: Sheena Pod MD     End of Session - 11/26/23 1443     Visit Number 19    Number of Visits 25    Date for Recertification  12/17/23    SLP Start Time 1400    SLP Stop Time  1445    SLP Time Calculation (min) 45 min    Activity Tolerance Patient tolerated treatment well           Patient Active Problem List   Diagnosis Date Noted   Benign prostatic hyperplasia with lower urinary tract symptoms 02/17/2020   Benign essential hypertension 11/24/2019   History of CVA (cerebrovascular accident) 07/08/2018   Lacunar infarct, 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 06/30/2018   Left leg weakness 06/29/2018   Parkinson disease (HCC) 06/29/2018   CAD (coronary artery disease), native coronary artery 06/29/2018   HTN (hypertension) 06/29/2018   Medicare annual wellness visit, initial 07/02/2017   B12 deficiency 06/25/2016   Hyperlipidemia, mixed 06/13/2015   Dyslipidemia 10/07/2012   GAD (generalized anxiety disorder) 10/07/2012   Insomnia 10/07/2012   Major depression 09/23/2012   Convulsions (HCC) 04/12/2012    ONSET DATE: 09/11/23   REFERRING DIAG: CVA  THERAPY DIAG:  Aphasia  Rationale for Evaluation and Treatment Rehabilitation  SUBJECTIVE:   SUBJECTIVE STATEMENT: Pt alert and cooperative.  Pt accompanied by: family member and [name] (wife) and adult child   PERTINENT HISTORY: Jeffery Strong is a 83 y.o. male with past medical history including Parkinson's disease, prior CVA (07/08/2018), depression, HTN who presented to [facility] on 09/11/23 with aphasia and confusion. MRI showed Multiple small infarcts in the left anterior temporal and parietal lobes and left MCA-PCA watershed distribution. Evaluated by SLP during acute stay with diet recommendation  dysphagia IDDSI L6 soft and bite sized L0 Thin Liquids. SLP noted aphasia with primarily expressive deficits, but difficulty with complex comprehension. Previously followed by Dr. Maree with baseline cognitive deficits secondary to PD (SLUMS 25/30 in 2018), difficulty remembering names, memory deficits reported since 2014.  DIAGNOSTIC FINDINGS: See above  PAIN:  Are you having pain? No   FALLS: Has patient fallen in last 6 months?  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  Pt answered yes/no questions with 80% accuracy with use of SGD, improving with repetition. Pt repeated single words with 40% accuracy with max cueing. Pt also participated in conversation about topic of interest. Pt benefits from key words and confirmation of messages. Increased verbal repetition of targets noted throughout session as well as repetition to clarify/confirm messages. Increased islands and error free speech as well as increased initiation observed.   PATIENT EDUCATION: Education details: See today's treatment for details Person educated: patient, wife, and adult child Education method: Explanation Education comprehension: verbalized understanding and needs further education  HOME EXERCISE PROGRAM:        Play on SGD  Use yes/no folder   Repeat targets   GOALS:  Goals reviewed with patient? Yes      SHORT TERM GOALS: Target date: 10 sessions  Patient will accurately respond to simple Y/N questions 80% accuracy with external supports as  needed. Baseline: Goal status: MET; continue for consistency   2.  Patient will complete standardized assessment of language (and cognition, as appropriate). Baseline:  Goal status: MET  3.  Patient will demonstrate awareness of communication breakdowns in 3/5 opportunities with usual moderate cues. Baseline:  Goal status: MET     LONG TERM GOALS: Target date: 12/17/23  With Moderate A, patient will verify messages when written keyword with yes/no question is provided in conversation with 80% accuracy to reduce communication breakdowns. Baseline:  Goal status: INITIAL  2.  With Mod I, patient/family will demonstrate understanding of the following concepts: aphasia, spontaneous recovery, communication vs conversation, strengths/strategies to promote success, local resources by answering multiple choice questions with 90% accuracy when provided supported conversation in order to increase patient's participation in medical care. Baseline:  Goal status: INITIAL  3.  Patient will effectively communicate wants/needs via multimodal means >80% accuracy with assistance from communication partners in order to improve expressive language. Baseline:  Goal status: INITIAL    ASSESSMENT:  CLINICAL IMPRESSION: Patient is an 83 y.o. male who was seen today for aphasia treatment in setting of recent stroke.   Based on initial assessment, Patient presents with severe Wernicke's aphasia impacting both expressive and receptive language. See details of tx session above. Patient would benefit from skilled ST to address language and cognitive impairments in order to increase ability to communicate wants and needs, follow commands, for increased safety and improved social connection with family and friends. Caregiver involvement likely necessary to make functional gains given the severity of client's deficits and his reduced awareness.  OBJECTIVE IMPAIRMENTS include attention, memory, awareness,  expressive language, receptive language, and aphasia. These impairments are limiting patient from return to work, managing medications, managing appointments, managing finances, household responsibilities, ADLs/IADLs, and effectively communicating at home and in community. Factors affecting potential to achieve goals and functional outcome are ability to learn/carryover information, co-morbidities, cooperation/participation level, severity of impairments, and family/community support (+). Patient will benefit from skilled SLP services to address above impairments and improve overall function.  REHAB POTENTIAL: Good *with caregiver support  PLAN: SLP FREQUENCY: 2x/week  SLP DURATION: 12 weeks  PLANNED INTERVENTIONS: Cognitive reorganization, Internal/external aids, Functional tasks, Multimodal communication approach, SLP instruction and feedback, Compensatory strategies, Patient/family education, and 07492 Treatment of speech (30 or 45 min)    Delon Bangs, M.S., CCC-SLP Speech-Language Pathologist [place] - [facility] [phone] (ASCOM)

## 2023-11-28 ENCOUNTER — Ambulatory Visit: Attending: Internal Medicine

## 2023-11-28 ENCOUNTER — Ambulatory Visit

## 2023-11-28 DIAGNOSIS — R4701 Aphasia: Secondary | ICD-10-CM | POA: Insufficient documentation

## 2023-11-28 DIAGNOSIS — R41841 Cognitive communication deficit: Secondary | ICD-10-CM | POA: Diagnosis present

## 2023-11-28 NOTE — Therapy (Signed)
 OUTPATIENT SPEECH LANGUAGE PATHOLOGY  TREATMENT    Patient Name: Jeffery Strong MRN: [id number] DOB:12/18/1940, 83 y.o., male Today's Date: 11/28/2023  PCP: Jeffery Oneil FALCON, MD  REFERRING PROVIDER: Sheena Pod MD  Speech Therapy Progress Note  Dates of Reporting Period: 10/24/23 to 11/28/23  Objective: Patient has been seen for 10 speech therapy sessions this reporting period targeting aphasia. Patient is making progress toward LTGs and met all STGs this reporting period. See skilled intervention, clinical impressions, and goals below for details.    End of Session - 11/28/23 1529     Visit Number 20    Number of Visits 25    Date for Recertification  12/17/23    SLP Start Time 1530    SLP Stop Time  1615    SLP Time Calculation (min) 45 min    Activity Tolerance Patient tolerated treatment well           Patient Active Problem List   Diagnosis Date Noted   Benign prostatic hyperplasia with lower urinary tract symptoms 02/17/2020   Benign essential hypertension 11/24/2019   History of CVA (cerebrovascular accident) 07/08/2018   Lacunar infarct, 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 06/30/2018   Left leg weakness 06/29/2018   Parkinson disease (HCC) 06/29/2018   CAD (coronary artery disease), native coronary artery 06/29/2018   HTN (hypertension) 06/29/2018   Medicare annual wellness visit, initial 07/02/2017   B12 deficiency 06/25/2016   Hyperlipidemia, mixed 06/13/2015   Dyslipidemia 10/07/2012   GAD (generalized anxiety disorder) 10/07/2012   Insomnia 10/07/2012   Major depression 09/23/2012   Convulsions (HCC) 04/12/2012    ONSET DATE: 09/11/23   REFERRING DIAG: CVA  THERAPY DIAG:  Aphasia  Cognitive communication deficit  Rationale for Evaluation and Treatment Rehabilitation  SUBJECTIVE:   SUBJECTIVE STATEMENT: Pt alert and cooperative.  Pt accompanied by: family member and [name] (wife) and adult child   PERTINENT HISTORY: Jeffery Strong is a 83 y.o. male with past medical history including Parkinson's disease, prior CVA (07/08/2018), depression, HTN who presented to [facility] on 09/11/23 with aphasia and confusion. MRI showed Multiple small infarcts in the left anterior temporal and parietal lobes and left MCA-PCA watershed distribution. Evaluated by SLP during acute stay with diet recommendation dysphagia IDDSI L6 soft and bite sized L0 Thin Liquids. SLP noted aphasia with primarily expressive deficits, but difficulty with complex comprehension. Previously followed by Dr. Maree with baseline cognitive deficits secondary to PD (SLUMS 25/30 in 2018), difficulty remembering names, memory deficits reported since 2014.  DIAGNOSTIC FINDINGS: See above  PAIN:  Are you having pain? No   FALLS: Has patient fallen in last 6 months?  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  Pt answered yes/no questions with 80% accuracy with use of SGD, improving with repetition. Pt 8/12 menu items with written cues indep, 10/12 with fair approximations. Pt also participated in conversation about topic of interest. Pt benefits from key words and confirmation of messages. Increased verbal repetition of targets noted throughout session as well as repetition to clarify/confirm  messages. Increased islands and error free speech as well as increased initiation observed.   PATIENT EDUCATION: Education details: See today's treatment for details Person educated: patient, wife, and adult child Education method:  Explanation Education comprehension: verbalized understanding and needs further education  HOME EXERCISE PROGRAM:        Play on SGD  Use yes/no folder   Repeat targets   GOALS:  Goals reviewed with patient? Yes      SHORT TERM GOALS: Target date: 10 sessions  Patient will accurately respond to simple Y/N questions 80% accuracy with external supports as needed. Baseline: Goal status: MET   2.  Patient will complete standardized assessment of language (and cognition, as appropriate). Baseline:  Goal status: MET  3.  Patient will demonstrate awareness of communication breakdowns in 3/5 opportunities with usual moderate cues. Baseline:  Goal status: MET     LONG TERM GOALS: Target date: 12/17/23  With Moderate A, patient will verify messages when written keyword with yes/no question is provided in conversation with 80% accuracy to reduce communication breakdowns. Baseline:  Goal status: PROGRESSING  2.  With Mod I, patient/family will demonstrate understanding of the following concepts: aphasia, spontaneous recovery, communication vs conversation, strengths/strategies to promote success, local resources by answering multiple choice questions with 90% accuracy when provided supported conversation in order to increase patient's participation in medical care. Baseline:  Goal status: PROGRESSING  3.  Patient will effectively communicate wants/needs via multimodal means >80% accuracy with assistance from communication partners in order to improve expressive language. Baseline:  Goal status: PROGRESSING    ASSESSMENT:  CLINICAL IMPRESSION: Patient is an 83 y.o. male who was seen today for aphasia treatment in setting of recent stroke.   Based on initial assessment, Patient presents with severe Wernicke's aphasia impacting both expressive and receptive language. See details of tx session above. Patient would benefit from skilled ST to address language and cognitive  impairments in order to increase ability to communicate wants and needs, follow commands, for increased safety and improved social connection with family and friends.   OBJECTIVE IMPAIRMENTS include attention, memory, awareness, expressive language, receptive language, and aphasia. These impairments are limiting patient from return to work, managing medications, managing appointments, managing finances, household responsibilities, ADLs/IADLs, and effectively communicating at home and in community. Factors affecting potential to achieve goals and functional outcome are ability to learn/carryover information, co-morbidities, cooperation/participation level, severity of impairments, and family/community support (+). Patient will benefit from skilled SLP services to address above impairments and improve overall function.  REHAB POTENTIAL: Good *with caregiver support  PLAN: SLP FREQUENCY: 2x/week  SLP DURATION: 12 weeks  PLANNED INTERVENTIONS: Cognitive reorganization, Internal/external aids, Functional tasks, Multimodal communication approach, SLP instruction and feedback, Compensatory strategies, Patient/family education, and 07492 Treatment of speech (30 or 45 min)    Delon Bangs, M.S., CCC-SLP Speech-Language Pathologist [place] - [facility] [phone] (ASCOM)

## 2023-12-03 ENCOUNTER — Encounter: Admitting: Occupational Therapy

## 2023-12-03 ENCOUNTER — Ambulatory Visit

## 2023-12-03 DIAGNOSIS — R4701 Aphasia: Secondary | ICD-10-CM | POA: Diagnosis not present

## 2023-12-03 NOTE — Therapy (Signed)
 OUTPATIENT SPEECH LANGUAGE PATHOLOGY  TREATMENT    Patient Name: Jeffery Strong MRN: [id number] DOB:[birth year], 83 y.o., male Today's Date: 12/03/2023  PCP: Jeffery Oneil FALCON, MD  REFERRING PROVIDER: Sheena Pod MD    End of Session - 12/03/23 1009     Visit Number 21    Number of Visits 25    Date for Recertification  12/17/23    SLP Start Time 1015    SLP Stop Time  1100    SLP Time Calculation (min) 45 min    Activity Tolerance Patient tolerated treatment well           Patient Active Problem List   Diagnosis Date Noted   Benign prostatic hyperplasia with lower urinary tract symptoms 02/17/2020   Benign essential hypertension 11/24/2019   History of CVA (cerebrovascular accident) 07/08/2018   Lacunar infarct, 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 06/30/2018   Left leg weakness 06/29/2018   Parkinson disease (HCC) 06/29/2018   CAD (coronary artery disease), native coronary artery 06/29/2018   HTN (hypertension) 06/29/2018   Medicare annual wellness visit, initial 07/02/2017   B12 deficiency 06/25/2016   Hyperlipidemia, mixed 06/13/2015   Dyslipidemia 10/07/2012   GAD (generalized anxiety disorder) 10/07/2012   Insomnia 10/07/2012   Major depression 09/23/2012   Convulsions (HCC) 04/12/2012    ONSET DATE: 09/11/23   REFERRING DIAG: CVA  THERAPY DIAG:  Aphasia  Rationale for Evaluation and Treatment Rehabilitation  SUBJECTIVE:   SUBJECTIVE STATEMENT: Pt alert and cooperative.  Pt accompanied by: family member and [name] (wife) and adult child   PERTINENT HISTORY: Jeffery Strong is a 83 y.o. male with past medical history including Parkinson's disease, prior CVA (07/08/2018), depression, HTN who presented to [facility] on 09/11/23 with aphasia and confusion. MRI showed Multiple small infarcts in the left anterior temporal and parietal lobes and left MCA-PCA watershed distribution. Evaluated by SLP during acute stay with diet recommendation  dysphagia IDDSI L6 soft and bite sized L0 Thin Liquids. SLP noted aphasia with primarily expressive deficits, but difficulty with complex comprehension. Previously followed by Dr. Maree with baseline cognitive deficits secondary to PD (SLUMS 25/30 in 2018), difficulty remembering names, memory deficits reported since 2014.  DIAGNOSTIC FINDINGS: See above  PAIN:  Are you having pain? No   FALLS: Has patient fallen in last 6 months?  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 Pt identified pictured words/actions from f=3 with ~70% accuracy indep, improving to ~80% with repetition and cueing. Pt also participated in conversation about topic of interest. Pt benefits from key words and confirmation of messages. Increased verbal repetition of targets noted throughout session as well as repetition to clarify/confirm messages. Increased islands and error free speech as well as increased initiation observed.   PATIENT EDUCATION: Education details: See today's treatment for details Person educated: patient, wife, and adult child Education method: Explanation Education comprehension: verbalized understanding and needs further education  HOME EXERCISE PROGRAM:        Play on SGD  Use yes/no folder   Repeat targets   GOALS:  Goals  reviewed with patient? Yes      SHORT TERM GOALS: Target date: 10 sessions  Patient will accurately respond to simple Y/N questions 80% accuracy with external supports as needed. Baseline: Goal status: MET   2.  Patient will complete  standardized assessment of language (and cognition, as appropriate). Baseline:  Goal status: MET  3.  Patient will demonstrate awareness of communication breakdowns in 3/5 opportunities with usual moderate cues. Baseline:  Goal status: MET     LONG TERM GOALS: Target date: 12/17/23  With Moderate A, patient will verify messages when written keyword with yes/no question is provided in conversation with 80% accuracy to reduce communication breakdowns. Baseline:  Goal status: PROGRESSING  2.  With Mod I, patient/family will demonstrate understanding of the following concepts: aphasia, spontaneous recovery, communication vs conversation, strengths/strategies to promote success, local resources by answering multiple choice questions with 90% accuracy when provided supported conversation in order to increase patient's participation in medical care. Baseline:  Goal status: PROGRESSING  3.  Patient will effectively communicate wants/needs via multimodal means >80% accuracy with assistance from communication partners in order to improve expressive language. Baseline:  Goal status: PROGRESSING    ASSESSMENT:  CLINICAL IMPRESSION: Patient is an 83 y.o. male who was seen today for aphasia treatment in setting of recent stroke.   Based on initial assessment, Patient presents with severe Wernicke's aphasia impacting both expressive and receptive language. See details of tx session above. Patient would benefit from skilled ST to address language and cognitive impairments in order to increase ability to communicate wants and needs, follow commands, for increased safety and improved social connection with family and friends.   OBJECTIVE IMPAIRMENTS include attention, memory, awareness, expressive language, receptive language, and aphasia. These impairments are limiting patient from return to work, managing medications, managing appointments, managing finances, household responsibilities, ADLs/IADLs,  and effectively communicating at home and in community. Factors affecting potential to achieve goals and functional outcome are ability to learn/carryover information, co-morbidities, cooperation/participation level, severity of impairments, and family/community support (+). Patient will benefit from skilled SLP services to address above impairments and improve overall function.  REHAB POTENTIAL: Good *with caregiver support  PLAN: SLP FREQUENCY: 2x/week  SLP DURATION: 12 weeks  PLANNED INTERVENTIONS: Cognitive reorganization, Internal/external aids, Functional tasks, Multimodal communication approach, SLP instruction and feedback, Compensatory strategies, Patient/family education, and 07492 Treatment of speech (30 or 45 min)    Delon Bangs, M.S., CCC-SLP Speech-Language Pathologist [place] - [facility] [phone] (ASCOM)

## 2023-12-05 ENCOUNTER — Ambulatory Visit

## 2023-12-05 ENCOUNTER — Encounter: Admitting: Occupational Therapy

## 2023-12-10 ENCOUNTER — Encounter: Admitting: Occupational Therapy

## 2023-12-10 ENCOUNTER — Ambulatory Visit

## 2023-12-10 DIAGNOSIS — R4701 Aphasia: Secondary | ICD-10-CM

## 2023-12-10 DIAGNOSIS — R41841 Cognitive communication deficit: Secondary | ICD-10-CM

## 2023-12-10 NOTE — Therapy (Signed)
 OUTPATIENT SPEECH LANGUAGE PATHOLOGY  TREATMENT / RE-EVALUATION   Patient Name: Jeffery Strong MRN: [id number] DOB:12-23-1940, 83 y.o., male Today's Date: 12/10/2023  PCP: Jeffery Oneil FALCON, MD  REFERRING PROVIDER: Sheena Pod MD    End of Session - 12/10/23 1100     Visit Number 22    Number of Visits 49    Date for Recertification  03/03/24           Patient Active Problem List   Diagnosis Date Noted   Benign prostatic hyperplasia with lower urinary tract symptoms 02/17/2020   Benign essential hypertension 11/24/2019   History of CVA (cerebrovascular accident) 07/08/2018   Lacunar infarct, 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 06/30/2018   Left leg weakness 06/29/2018   Parkinson disease (HCC) 06/29/2018   CAD (coronary artery disease), native coronary artery 06/29/2018   HTN (hypertension) 06/29/2018   Medicare annual wellness visit, initial 07/02/2017   B12 deficiency 06/25/2016   Hyperlipidemia, mixed 06/13/2015   Dyslipidemia 10/07/2012   GAD (generalized anxiety disorder) 10/07/2012   Insomnia 10/07/2012   Major depression 09/23/2012   Convulsions (HCC) 04/12/2012    ONSET DATE: 09/11/23   REFERRING DIAG: CVA  THERAPY DIAG:  Aphasia  Cognitive communication deficit  Rationale for Evaluation and Treatment Rehabilitation  SUBJECTIVE:   SUBJECTIVE STATEMENT: Pt alert and cooperative.  Pt accompanied by: family member and [name] (wife) and adult child   PERTINENT HISTORY: Jeffery Strong is a 84 y.o. male with past medical history including Parkinson's disease, prior CVA (07/08/2018), depression, HTN who presented to [facility] on 09/11/23 with aphasia and confusion. MRI showed Multiple small infarcts in the left anterior temporal and parietal lobes and left MCA-PCA watershed distribution. Evaluated by SLP during acute stay with diet recommendation dysphagia IDDSI L6 soft and bite sized L0 Thin Liquids. SLP noted aphasia with primarily expressive  deficits, but difficulty with complex comprehension. Previously followed by Jeffery Strong with baseline cognitive deficits secondary to PD (SLUMS 25/30 in 2018), difficulty remembering names, memory deficits reported since 2014.  DIAGNOSTIC FINDINGS: See above  PAIN:  Are you having pain? No   FALLS: Has patient fallen in last 6 months?  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 Pt repeated single words with 70% accuracy, 2-word phrases with 30% accuracy. Pt identified objects from f=3 with 100% accuracy indep. Pt named common objects with 40% accuracy with min/max verbal cueing. Pt with emerging benefit of hierarchical verbal cues. Pt also participated in conversation about topic of interest. Pt benefits from key words and confirmation of messages. Increased verbal repetition of targets noted throughout session as well as repetition to clarify/confirm messages. Increased islands and error free speech as well as increased initiation observed.   PATIENT EDUCATION: Education details: See today's treatment for details Person educated: patient, wife, and adult child Education method: Explanation Education comprehension: verbalized understanding and needs further education  HOME EXERCISE PROGRAM:        Play on SGD  Use yes/no folder   Repeat targets   GOALS:  Goals reviewed with  patient? Yes      SHORT TERM GOALS: Target date: 10 sessions  Patient will accurately respond to simple Y/N questions 80% accuracy with external supports as  needed. Baseline: Goal status: MET   2.  Patient will complete standardized assessment of language (and cognition, as appropriate). Baseline:  Goal status: MET  3.  Patient will demonstrate awareness of communication breakdowns in 3/5 opportunities with usual moderate cues. Baseline:  Goal status: MET New goals 12/10/23   1.  With Min A, patient will answer (yes/no vs. open-ended questions) regarding basic personal information with 80% accuracy and use of total communication.  Baseline: Goal status: INITIAL      2. With max cueing, pt will follow 1-step commands with 70% accuracy.   Baseline:  Goal status: INITIAL  3. Pt will repeat single words with 80% accuracy with max cues.   Baseline:  Goal status: INITIAL  4. Pt will name common objects with 60% accuracy and max cues.   Baseline:  Goal status: INITIAL     LONG TERM GOALS: Target date: 12/17/23  With Moderate A, patient will verify messages when written keyword with yes/no question is provided in conversation with 80% accuracy to reduce communication breakdowns. Baseline:  Goal status: MET  2.  With Mod I, patient/family will demonstrate understanding of the following concepts: aphasia, spontaneous recovery, communication vs conversation, strengths/strategies to promote success, local resources by answering multiple choice questions with 90% accuracy when provided supported conversation in order to increase patient's participation in medical care. Baseline:  Goal status: MET  3.  Patient will effectively communicate wants/needs via multimodal means >80% accuracy with assistance from communication partners in order to improve expressive language. Baseline:  Goal status: MET  New goal 12/10/23; Target date 03/03/24 Pt and/or family will demonstrate understanding of activities to promote language recovery/cognition at home.   Baseline:  Goal status: INTIAL          ASSESSMENT:  CLINICAL IMPRESSION: Patient is  an 83 y.o. male who was seen today for aphasia treatment in setting of recent stroke. Based on assessment, Patient presents with severe Wernicke's aphasia impacting both expressive and receptive language. Pt continues to make steady progress and has secured a [employer] SGD for communication and home practice. Pt with supportive family who has been instrumental in his recovery. See details of tx session above. Patient would benefit from ongoing skilled ST to address language and cognitive impairments in order to increase ability to communicate wants and needs, follow commands, for increased safety and improved social connection with family and friends.  OBJECTIVE IMPAIRMENTS include attention, memory, awareness, expressive language, receptive language, and aphasia. These impairments are limiting patient from return to work, managing medications, managing appointments, managing finances, household responsibilities, ADLs/IADLs, and effectively communicating at home and in community. Factors affecting potential to achieve goals and functional outcome are ability to learn/carryover information, co-morbidities, cooperation/participation level, severity of impairments, and family/community support (+). Patient will benefit from skilled SLP services to address above impairments and improve overall function.  REHAB POTENTIAL: Good *with caregiver support  PLAN: SLP FREQUENCY: 2x/week  SLP DURATION: 12 weeks  PLANNED INTERVENTIONS: Cognitive reorganization, Internal/external aids, Functional tasks, Multimodal communication approach, SLP instruction and feedback, Compensatory strategies, Patient/family education, and 07492 Treatment of speech (30 or 45 min)    Delon Bangs, M.S., CCC-SLP Speech-Language Pathologist [place] - [facility] [phone] (ASCOM)

## 2023-12-12 ENCOUNTER — Encounter

## 2023-12-12 ENCOUNTER — Ambulatory Visit

## 2023-12-12 DIAGNOSIS — R4701 Aphasia: Secondary | ICD-10-CM | POA: Diagnosis not present

## 2023-12-12 NOTE — Therapy (Signed)
 OUTPATIENT SPEECH LANGUAGE PATHOLOGY  TREATMENT  Patient Name: Jeffery Strong MRN: [id number] DOB:1940/07/25, 83 y.o., male Today's Date: 12/12/2023  PCP: Cleotilde Oneil FALCON, MD  REFERRING PROVIDER: Sheena Pod MD    End of Session - 12/12/23 1203     Visit Number 23    Number of Visits 49    Date for Recertification  03/03/24    SLP Start Time 1100    SLP Stop Time  1150    SLP Time Calculation (min) 50 min           Patient Active Problem List   Diagnosis Date Noted   Benign prostatic hyperplasia with lower urinary tract symptoms 02/17/2020   Benign essential hypertension 11/24/2019   History of CVA (cerebrovascular accident) 07/08/2018   Lacunar infarct, 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 06/30/2018   Left leg weakness 06/29/2018   Parkinson disease (HCC) 06/29/2018   CAD (coronary artery disease), native coronary artery 06/29/2018   HTN (hypertension) 06/29/2018   Medicare annual wellness visit, initial 07/02/2017   B12 deficiency 06/25/2016   Hyperlipidemia, mixed 06/13/2015   Dyslipidemia 10/07/2012   GAD (generalized anxiety disorder) 10/07/2012   Insomnia 10/07/2012   Major depression 09/23/2012   Convulsions (HCC) 04/12/2012    ONSET DATE: 09/11/23   REFERRING DIAG: CVA  THERAPY DIAG:  Aphasia  Rationale for Evaluation and Treatment Rehabilitation  SUBJECTIVE:   SUBJECTIVE STATEMENT: Pt alert and cooperative.  Pt accompanied by: family member and [name] (wife) and adult child   PERTINENT HISTORY: Jeffery Strong is a 83 y.o. male with past medical history including Parkinson's disease, prior CVA (07/08/2018), depression, HTN who presented to [facility] on 09/11/23 with aphasia and confusion. MRI showed Multiple small infarcts in the left anterior temporal and parietal lobes and left MCA-PCA watershed distribution. Evaluated by SLP during acute stay with diet recommendation dysphagia IDDSI L6 soft and bite sized L0 Thin Liquids. SLP noted  aphasia with primarily expressive deficits, but difficulty with complex comprehension. Previously followed by Dr. Maree with baseline cognitive deficits secondary to PD (SLUMS 25/30 in 2018), difficulty remembering names, memory deficits reported since 2014.  DIAGNOSTIC FINDINGS: See above  PAIN:  Are you having pain? No   FALLS: Has patient fallen in last 6 months?  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 ~60% accuracy with mod/max cueing when pertaining to topic of interest. Pt repeated single words with 60% accuracy, max cues. Pt with emerging benefit of hierarchical verbal cues. Pt also participated in conversation about topic of interest. Pt benefits from key words and confirmation of messages. Increased verbal repetition of targets noted throughout session as well as repetition to clarify/confirm messages. Increased islands and error free speech as well as increased initiation observed.   Pt set up for HEP on [employer] on SGD.   PATIENT EDUCATION: Education details: See today's treatment for details Person educated: patient, wife, and adult child Education method: Explanation Education comprehension: verbalized understanding and needs further education  HOME EXERCISE PROGRAM:        HEP on [employer] (on SGD)  GOALS:  Goals reviewed with patient? Yes      SHORT TERM GOALS: Target date: 10 sessions  Patient will accurately respond to simple Y/N questions 80% accuracy with external supports as needed. Baseline: Goal status: MET   2.  Patient will  complete standardized assessment of language (and cognition, as appropriate). Baseline:  Goal status: MET  3.  Patient will demonstrate awareness of communication breakdowns in 3/5 opportunities with usual moderate cues. Baseline:  Goal status: MET New goals 12/10/23   1.  With Min A, patient will answer (yes/no vs. open-ended questions) regarding basic personal information with 80% accuracy and use of total communication.  Baseline: Goal status: INITIAL      2. With max cueing, pt will follow 1-step commands with 70% accuracy.   Baseline:  Goal status: INITIAL  3. Pt will repeat single words with 80% accuracy with max cues.   Baseline:  Goal status: INITIAL  4. Pt will name common objects with 60% accuracy and max cues.   Baseline:  Goal status: INITIAL     LONG TERM GOALS: Target date: 12/17/23  With Moderate A, patient will verify messages when written keyword with yes/no question is provided in conversation with 80% accuracy to reduce communication breakdowns. Baseline:  Goal status: MET  2.  With Mod I, patient/family will demonstrate understanding of the following concepts: aphasia, spontaneous recovery, communication vs conversation, strengths/strategies to promote success, local resources by answering multiple choice questions with 90% accuracy when provided supported conversation in order to increase patient's participation in medical care. Baseline:  Goal status: MET  3.  Patient will effectively communicate wants/needs via multimodal means >80% accuracy with assistance from communication partners in order to improve expressive language. Baseline:  Goal status: MET  New goal 12/10/23; Target date 03/03/24 Pt and/or family will demonstrate understanding of activities to promote language recovery/cognition at home.   Baseline:  Goal status: INTIAL          ASSESSMENT:  CLINICAL IMPRESSION: Patient is an 83 y.o. male who was seen today for aphasia treatment  in setting of recent stroke. Based on assessment, Patient presents with severe Wernicke's aphasia impacting both expressive and receptive language. Pt continues to make steady progress and has secured a [employer] SGD for communication and home practice. Pt with supportive family who has been instrumental in his recovery. See details of tx session above. Patient would benefit from ongoing skilled ST to address language and cognitive impairments in order to increase ability to communicate wants and needs, follow commands, for increased safety and improved social connection with family and friends.  OBJECTIVE IMPAIRMENTS include attention, memory, awareness, expressive language, receptive language, and aphasia. These impairments are limiting patient from return to work, managing medications, managing appointments, managing finances, household responsibilities, ADLs/IADLs, and effectively communicating at home and in community. Factors affecting potential to achieve goals and functional outcome are ability to learn/carryover information, co-morbidities, cooperation/participation level, severity of impairments, and family/community support (+). Patient will benefit from skilled SLP services to address above impairments and improve overall function.  REHAB POTENTIAL: Good *with caregiver support  PLAN: SLP FREQUENCY: 2x/week  SLP DURATION: 12 weeks  PLANNED INTERVENTIONS: Cognitive reorganization, Internal/external aids, Functional tasks, Multimodal communication approach, SLP instruction and feedback, Compensatory strategies, Patient/family education, and [id number] Treatment of speech (30 or 45 min)    Delon Bangs, M.S., CCC-SLP Speech-Language Pathologist [place] - [facility] [phone] (ASCOM)

## 2023-12-17 ENCOUNTER — Ambulatory Visit

## 2023-12-17 ENCOUNTER — Encounter: Admitting: Occupational Therapy

## 2023-12-17 DIAGNOSIS — R4701 Aphasia: Secondary | ICD-10-CM | POA: Diagnosis not present

## 2023-12-17 NOTE — Therapy (Signed)
 OUTPATIENT SPEECH LANGUAGE PATHOLOGY  TREATMENT  Patient Name: EVERTTE SONES MRN: [id number] DOB:08-07-1940, 83 y.o., male Today's Date: 12/17/2023  PCP: Cleotilde Oneil FALCON, MD  REFERRING PROVIDER: Sheena Pod MD    End of Session - 12/17/23 1236     Visit Number 24    Number of Visits 49    Date for Recertification  03/03/24    Authorization Type UHC auth#: [id number] for 8 ST vst from 10/16-11/13 [facility] medicare 2025    Authorization - Visit Number 2    Authorization - Number of Visits 8    Progress Note Due on Visit 30    SLP Start Time 1015    SLP Stop Time  1100    SLP Time Calculation (min) 45 min    Activity Tolerance Patient tolerated treatment well           Patient Active Problem List   Diagnosis Date Noted   Benign prostatic hyperplasia with lower urinary tract symptoms 02/17/2020   Benign essential hypertension 11/24/2019   History of CVA (cerebrovascular accident) 07/08/2018   Lacunar infarct, 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 06/30/2018   Left leg weakness 06/29/2018   Parkinson disease (HCC) 06/29/2018   CAD (coronary artery disease), native coronary artery 06/29/2018   HTN (hypertension) 06/29/2018   Medicare annual wellness visit, initial 07/02/2017   B12 deficiency 06/25/2016   Hyperlipidemia, mixed 06/13/2015   Dyslipidemia 10/07/2012   GAD (generalized anxiety disorder) 10/07/2012   Insomnia 10/07/2012   Major depression 09/23/2012   Convulsions (HCC) 04/12/2012    ONSET DATE: 09/11/23   REFERRING DIAG: CVA  THERAPY DIAG:  Aphasia  Rationale for Evaluation and Treatment Rehabilitation  SUBJECTIVE:   SUBJECTIVE STATEMENT: Pt alert and cooperative.  Pt accompanied by: family member and adult child   PERTINENT HISTORY: Amarii Bordas is a 83 y.o. male with past medical history including Parkinson's disease, prior CVA (07/08/2018), depression, HTN who presented to [facility] on 09/11/23 with aphasia and confusion. MRI showed  Multiple small infarcts in the left anterior temporal and parietal lobes and left MCA-PCA watershed distribution. Evaluated by SLP during acute stay with diet recommendation dysphagia IDDSI L6 soft and bite sized L0 Thin Liquids. SLP noted aphasia with primarily expressive deficits, but difficulty with complex comprehension. Previously followed by Dr. Maree with baseline cognitive deficits secondary to PD (SLUMS 25/30 in 2018), difficulty remembering names, memory deficits reported since 2014.  DIAGNOSTIC FINDINGS: See above  PAIN:  Are you having pain? No   FALLS: Has patient fallen in last 6 months?  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 Pt repeated single words with 70% accuracy, max cues. Pt with emerging benefit of hierarchical verbal cues. Pt also participated in conversation about topic of interest. Pt benefits from key words and confirmation of messages. Increased verbal repetition of targets noted throughout session as well as repetition to clarify/confirm messages. Increased islands and error free speech. Discussed ways to promote cognitive-communication outside of ST including HEP, physical activity, and partaking in hobbies/cognitive activities in a controlled setting.  Pt/daughter endorsed pt  working on HEP at home on SGD with assistance.  PATIENT EDUCATION: Education details: See today's treatment for details Person educated: patient, adult child Education method: Explanation Education comprehension: verbalized understanding and needs further  education  HOME EXERCISE PROGRAM:        HEP on TalkPath (on SGD)   GOALS:  Goals reviewed with patient? Yes      SHORT TERM GOALS: Target date: 10 sessions  Patient will accurately respond to simple Y/N questions 80% accuracy with external supports as needed. Baseline: Goal status: MET   2.  Patient will complete standardized assessment of language (and cognition, as appropriate). Baseline:  Goal status: MET  3.  Patient will demonstrate awareness of communication breakdowns in 3/5 opportunities with usual moderate cues. Baseline:  Goal status: MET New goals 12/10/23   1.  With Min A, patient will answer (yes/no vs. open-ended questions) regarding basic personal information with 80% accuracy and use of total communication.  Baseline: Goal status: INITIAL      2. With max cueing, pt will follow 1-step commands with 70% accuracy.   Baseline:  Goal status: INITIAL  3. Pt will repeat single words with 80% accuracy with max cues.   Baseline:  Goal status: INITIAL  4. Pt will name common objects with 60% accuracy and max cues.   Baseline:  Goal status: INITIAL     LONG TERM GOALS: Target date: 12/17/23  With Moderate A, patient will verify messages when written keyword with yes/no question is provided in conversation with 80% accuracy to reduce communication breakdowns. Baseline:  Goal status: MET  2.  With Mod I, patient/family will demonstrate understanding of the following concepts: aphasia, spontaneous recovery, communication vs conversation, strengths/strategies to promote success, local resources by answering multiple choice questions with 90% accuracy when provided supported conversation in order to increase patient's participation in medical care. Baseline:  Goal status: MET  3.  Patient will effectively communicate wants/needs via multimodal means >80% accuracy with assistance from communication partners in order to improve expressive language. Baseline:   Goal status: MET  New goal 12/10/23; Target date 03/03/24 Pt and/or family will demonstrate understanding of activities to promote language recovery/cognition at home.   Baseline:  Goal status: INTIAL          ASSESSMENT:  CLINICAL IMPRESSION: Patient is an 83 y.o. male who was seen today for aphasia treatment in setting of recent stroke. Based on assessment, Patient presents with severe Wernicke's aphasia impacting both expressive and receptive language. Pt continues to make steady progress and has secured a [employer] SGD for communication and home practice. Pt with supportive family who has been instrumental in his recovery. See details of tx session above. Patient would benefit from ongoing skilled ST to address language and cognitive impairments in order to increase ability to communicate wants and needs, follow commands, for increased safety and improved social connection with family and friends.  OBJECTIVE IMPAIRMENTS include attention, memory, awareness, expressive language, receptive language, and aphasia. These impairments are limiting patient from return to work, managing medications, managing appointments, managing finances, household responsibilities, ADLs/IADLs, and effectively communicating at home and in community. Factors affecting potential to achieve goals and functional outcome are ability to learn/carryover information, co-morbidities, cooperation/participation level, severity of impairments, and family/community support (+). Patient will benefit from skilled SLP services to address above impairments and improve overall function.  REHAB POTENTIAL: Good *with caregiver support  PLAN: SLP FREQUENCY: 2x/week  SLP DURATION: 12 weeks  PLANNED INTERVENTIONS: Cognitive reorganization, Internal/external  aids, Functional tasks, Multimodal communication approach, SLP instruction and feedback, Compensatory strategies, Patient/family education, and 07492 Treatment of speech (30 or  45 min)    Delon Bangs, M.S., CCC-SLP Speech-Language Pathologist [place] [facility] [phone] (ASCOM)

## 2023-12-19 ENCOUNTER — Ambulatory Visit

## 2023-12-19 ENCOUNTER — Encounter: Admitting: Occupational Therapy

## 2023-12-19 DIAGNOSIS — R4701 Aphasia: Secondary | ICD-10-CM

## 2023-12-19 NOTE — Therapy (Signed)
 OUTPATIENT SPEECH LANGUAGE PATHOLOGY  TREATMENT  Patient Name: Jeffery Strong MRN: [id number] DOB:1941/02/02, 83 y.o., male Today's Date: 12/19/2023  PCP: Cleotilde Oneil FALCON, MD  REFERRING PROVIDER: Sheena Pod MD    End of Session - 12/19/23 1348     Visit Number 25    Date for Recertification  03/03/24    Authorization Type UHC auth#: [id number] for 8 ST vst from 10/16-11/13 [facility] medicare 2025    Authorization - Visit Number 3    Authorization - Number of Visits 8    Progress Note Due on Visit 30    SLP Start Time 1355    SLP Stop Time  1440    SLP Time Calculation (min) 45 min    Activity Tolerance Patient tolerated treatment well           Patient Active Problem List   Diagnosis Date Noted   Benign prostatic hyperplasia with lower urinary tract symptoms 02/17/2020   Benign essential hypertension 11/24/2019   History of CVA (cerebrovascular accident) 07/08/2018   Lacunar infarct, 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 06/30/2018   Left leg weakness 06/29/2018   Parkinson disease (HCC) 06/29/2018   CAD (coronary artery disease), native coronary artery 06/29/2018   HTN (hypertension) 06/29/2018   Medicare annual wellness visit, initial 07/02/2017   B12 deficiency 06/25/2016   Hyperlipidemia, mixed 06/13/2015   Dyslipidemia 10/07/2012   GAD (generalized anxiety disorder) 10/07/2012   Insomnia 10/07/2012   Major depression 09/23/2012   Convulsions (HCC) 04/12/2012    ONSET DATE: 09/11/23   REFERRING DIAG: CVA  THERAPY DIAG:  Aphasia  Rationale for Evaluation and Treatment Rehabilitation  SUBJECTIVE:   SUBJECTIVE STATEMENT: Pt alert and cooperative.  Pt accompanied by: family member and wife and adult child   PERTINENT HISTORY: Jeffery Strong is a 83 y.o. male with past medical history including Parkinson's disease, prior CVA (07/08/2018), depression, HTN who presented to [facility] on 09/11/23 with aphasia and confusion. MRI showed Multiple small  infarcts in the left anterior temporal and parietal lobes and left MCA-PCA watershed distribution. Evaluated by SLP during acute stay with diet recommendation dysphagia IDDSI L6 soft and bite sized L0 Thin Liquids. SLP noted aphasia with primarily expressive deficits, but difficulty with complex comprehension. Previously followed by Dr. Maree with baseline cognitive deficits secondary to PD (SLUMS 25/30 in 2018), difficulty remembering names, memory deficits reported since 2014.  DIAGNOSTIC FINDINGS: See above  PAIN:  Are you having pain? No   FALLS: Has patient fallen in last 6 months?  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 Added several family names/pictures to SGD. Pt repeated single words with 70% accuracy, max cues. Pt with emerging benefit of hierarchical verbal cues. Pt also participated in conversation about topic of interest. Pt benefits from key words and confirmation of messages. Increased verbal repetition of targets noted throughout session as well as repetition to clarify/confirm messages. Increased islands and error free speech. Discussed ways to promote cognitive-communication outside of ST including HEP, physical activity, and partaking in hobbies/cognitive activities in a controlled setting.  Pt/daughter endorsed  pt working on HEP at home on SGD with assistance.  PATIENT EDUCATION: Education details: See today's treatment for details Person educated: patient, adult child, wife Education method: Explanation Education comprehension: verbalized understanding  and needs further education  HOME EXERCISE PROGRAM:        HEP on TalkPath (on SGD)   GOALS:  Goals reviewed with patient? Yes      SHORT TERM GOALS: Target date: 10 sessions  Patient will accurately respond to simple Y/N questions 80% accuracy with external supports as needed. Baseline: Goal status: MET   2.  Patient will complete standardized assessment of language (and cognition, as appropriate). Baseline:  Goal status: MET  3.  Patient will demonstrate awareness of communication breakdowns in 3/5 opportunities with usual moderate cues. Baseline:  Goal status: MET New goals 12/10/23   1.  With Min A, patient will answer (yes/no vs. open-ended questions) regarding basic personal information with 80% accuracy and use of total communication.  Baseline: Goal status: INITIAL      2. With max cueing, pt will follow 1-step commands with 70% accuracy.   Baseline:  Goal status: INITIAL  3. Pt will repeat single words with 80% accuracy with max cues.   Baseline:  Goal status: INITIAL  4. Pt will name common objects with 60% accuracy and max cues.   Baseline:  Goal status: INITIAL     LONG TERM GOALS: Target date: 12/17/23  With Moderate A, patient will verify messages when written keyword with yes/no question is provided in conversation with 80% accuracy to reduce communication breakdowns. Baseline:  Goal status: MET  2.  With Mod I, patient/family will demonstrate understanding of the following concepts: aphasia, spontaneous recovery, communication vs conversation, strengths/strategies to promote success, local resources by answering multiple choice questions with 90% accuracy when provided supported conversation in order to increase patient's participation in medical care. Baseline:  Goal status: MET  3.  Patient will effectively communicate wants/needs via multimodal means >80% accuracy with assistance from communication partners in order to improve expressive  language. Baseline:  Goal status: MET  New goal 12/10/23; Target date 03/03/24 Pt and/or family will demonstrate understanding of activities to promote language recovery/cognition at home.   Baseline:  Goal status: INTIAL          ASSESSMENT:  CLINICAL IMPRESSION: Patient is an 83 y.o. male who was seen today for aphasia treatment in setting of recent stroke. Based on assessment, Patient presents with severe Wernicke's aphasia impacting both expressive and receptive language. Pt continues to make steady progress and has secured a [employer] SGD for communication and home practice. Pt with supportive family who has been instrumental in his recovery. See details of tx session above. Patient would benefit from ongoing skilled ST to address language and cognitive impairments in order to increase ability to communicate wants and needs, follow commands, for increased safety and improved social connection with family and friends.  OBJECTIVE IMPAIRMENTS include attention, memory, awareness, expressive language, receptive language, and aphasia. These impairments are limiting patient from return to work, managing medications, managing appointments, managing finances, household responsibilities, ADLs/IADLs, and effectively communicating at home and in community. Factors affecting potential to achieve goals and functional outcome are ability to learn/carryover information, co-morbidities, cooperation/participation level, severity of impairments, and family/community support (+). Patient will benefit from skilled SLP services to address above impairments and improve overall function.  REHAB POTENTIAL: Good *with caregiver support  PLAN: SLP FREQUENCY: 2x/week  SLP DURATION: 12 weeks  PLANNED INTERVENTIONS: Cognitive  reorganization, Internal/external aids, Functional tasks, Multimodal communication approach, SLP instruction and feedback, Compensatory strategies, Patient/family education, and [id number]  Treatment of speech (30 or 45 min)    Delon Bangs, M.S., CCC-SLP Speech-Language Pathologist [place] [facility] [phone] (ASCOM)

## 2023-12-24 ENCOUNTER — Ambulatory Visit

## 2023-12-24 DIAGNOSIS — R4701 Aphasia: Secondary | ICD-10-CM

## 2023-12-24 NOTE — Therapy (Signed)
 OUTPATIENT SPEECH LANGUAGE PATHOLOGY  TREATMENT  Patient Name: TIGE MEAS MRN: [id number] DOB:June 05, 1940, 83 y.o., male Today's Date: 12/24/2023  PCP: Cleotilde Oneil FALCON, MD  REFERRING PROVIDER: Sheena Pod MD    End of Session - 12/24/23 1052     Visit Number 26    Number of Visits 49    Date for Recertification  03/03/24    Authorization Type UHC auth#: [id number] for 8 ST vst from 10/16-11/13 [facility] medicare 2025    Authorization - Visit Number 4    Authorization - Number of Visits 8    Progress Note Due on Visit 30    SLP Start Time 1015    SLP Stop Time  1050    SLP Time Calculation (min) 35 min           Patient Active Problem List   Diagnosis Date Noted   Benign prostatic hyperplasia with lower urinary tract symptoms 02/17/2020   Benign essential hypertension 11/24/2019   History of CVA (cerebrovascular accident) 07/08/2018   Lacunar infarct, 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 06/30/2018   Left leg weakness 06/29/2018   Parkinson disease (HCC) 06/29/2018   CAD (coronary artery disease), native coronary artery 06/29/2018   HTN (hypertension) 06/29/2018   Medicare annual wellness visit, initial 07/02/2017   B12 deficiency 06/25/2016   Hyperlipidemia, mixed 06/13/2015   Dyslipidemia 10/07/2012   GAD (generalized anxiety disorder) 10/07/2012   Insomnia 10/07/2012   Major depression 09/23/2012   Convulsions (HCC) 04/12/2012    ONSET DATE: 09/11/23   REFERRING DIAG: CVA  THERAPY DIAG:  Aphasia  Rationale for Evaluation and Treatment Rehabilitation  SUBJECTIVE:   SUBJECTIVE STATEMENT: Pt alert and cooperative.  Pt accompanied by: family member and wife and adult child   PERTINENT HISTORY: Meer Reindl is a 83 y.o. male with past medical history including Parkinson's disease, prior CVA (07/08/2018), depression, HTN who presented to [facility] on 09/11/23 with aphasia and confusion. MRI showed Multiple small infarcts in the left anterior temporal  and parietal lobes and left MCA-PCA watershed distribution. Evaluated by SLP during acute stay with diet recommendation dysphagia IDDSI L6 soft and bite sized L0 Thin Liquids. SLP noted aphasia with primarily expressive deficits, but difficulty with complex comprehension. Previously followed by Dr. Maree with baseline cognitive deficits secondary to PD (SLUMS 25/30 in 2018), difficulty remembering names, memory deficits reported since 2014.  DIAGNOSTIC FINDINGS: See above  PAIN:  Are you having pain? No   FALLS: Has patient fallen in last 6 months?  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 Added several family names/pictures to SGD. Pt repeated single words with 70% accuracy, max cues. 2 word phrases with 20% accuracy, max cues. Pt with emerging benefit of hierarchical verbal cues. Pt also participated in conversation about topic of interest. Pt benefits from key words and confirmation of messages. Increased verbal repetition of targets noted throughout session as well as repetition to clarify/confirm messages. Increased islands and error free speech.   Pt/daughter endorsed pt working on HEP at home on SGD with assistance.  Pt with vague  reports of chest pain/tightness on L side. Pt and family taking him to Urgent Care/ED.  PATIENT EDUCATION: Education details: See today's treatment for details Person educated: patient, adult child, wife Education method: Explanation Education comprehension: verbalized understanding and needs further education  HOME EXERCISE PROGRAM:         HEP on TalkPath (on SGD)   GOALS:  Goals reviewed with patient? Yes      SHORT TERM GOALS: Target date: 10 sessions  Patient will accurately respond to simple Y/N questions 80% accuracy with external supports as needed. Baseline: Goal status: MET   2.  Patient will complete standardized assessment of language (and cognition, as appropriate). Baseline:  Goal status: MET  3.  Patient will demonstrate awareness of communication breakdowns in 3/5 opportunities with usual moderate cues. Baseline:  Goal status: MET New goals 12/10/23   1.  With Min A, patient will answer (yes/no vs. open-ended questions) regarding basic personal information with 80% accuracy and use of total communication.  Baseline: Goal status: INITIAL      2. With max cueing, pt will follow 1-step commands with 70% accuracy.   Baseline:  Goal status: INITIAL  3. Pt will repeat single words with 80% accuracy with max cues.   Baseline:  Goal status: INITIAL  4. Pt will name common objects with 60% accuracy and max cues.   Baseline:  Goal status: INITIAL     LONG TERM GOALS: Target date: 12/17/23  With Moderate A, patient will verify messages when written keyword with yes/no question is provided in conversation with 80% accuracy to reduce communication breakdowns. Baseline:  Goal status: MET  2.  With Mod I, patient/family will demonstrate understanding of the following concepts: aphasia, spontaneous recovery, communication vs conversation, strengths/strategies to promote success, local resources by answering multiple choice questions with 90% accuracy when provided supported conversation in order to increase patient's participation in medical care. Baseline:  Goal status: MET  3.  Patient will effectively communicate wants/needs via multimodal means >80% accuracy with assistance from communication partners in order to improve expressive language. Baseline:  Goal status: MET  New goal 12/10/23;  Target date 03/03/24 Pt and/or family will demonstrate understanding of activities to promote language recovery/cognition at home.   Baseline:  Goal status: INTIAL          ASSESSMENT:  CLINICAL IMPRESSION: Patient is an 83 y.o. male who was seen today for aphasia treatment in setting of recent stroke. Based on assessment, Patient presents with severe Wernicke's aphasia impacting both expressive and receptive language. Pt continues to make steady progress and has secured a [employer] SGD for communication and home practice. Pt with supportive family who has been instrumental in his recovery. See details of tx session above. Patient would benefit from ongoing skilled ST to address language and cognitive impairments in order to increase ability to communicate wants and needs, follow commands, for increased safety and improved social connection with family and friends.  OBJECTIVE IMPAIRMENTS include attention, memory, awareness, expressive language, receptive language, and aphasia. These impairments are limiting patient from return to work, managing medications, managing appointments, managing finances, household responsibilities, ADLs/IADLs, and effectively communicating at home and in community. Factors affecting potential to achieve goals and functional outcome are ability to learn/carryover information, co-morbidities, cooperation/participation level, severity of impairments, and family/community support (+). Patient will benefit from skilled SLP services to address above impairments and improve overall function.  REHAB POTENTIAL: Good *with caregiver support  PLAN: SLP FREQUENCY: 2x/week  SLP DURATION: 12  weeks  PLANNED INTERVENTIONS: Cognitive reorganization, Internal/external aids, Functional tasks, Multimodal communication approach, SLP instruction and feedback, Compensatory strategies, Patient/family education, and [id number] Treatment of speech (30 or 45 min)    Delon Bangs, M.S.,  CCC-SLP Speech-Language Pathologist [place] [facility] [phone] (ASCOM)

## 2023-12-26 ENCOUNTER — Ambulatory Visit

## 2023-12-26 DIAGNOSIS — R4701 Aphasia: Secondary | ICD-10-CM

## 2023-12-26 NOTE — Therapy (Signed)
 OUTPATIENT SPEECH LANGUAGE PATHOLOGY  TREATMENT  Patient Name: Jeffery Strong MRN: [id number] DOB:16-Feb-1941, 83 y.o., male Today's Date: 12/26/2023  PCP: Cleotilde Oneil FALCON, MD  REFERRING PROVIDER: Sheena Pod MD    End of Session - 12/26/23 0924     Visit Number 27    Number of Visits 49    Date for Recertification  03/03/24    Authorization Type UHC auth#: [id number] for 8 ST vst from 10/16-11/13 [facility] medicare 2025    Authorization - Visit Number 5    Authorization - Number of Visits 8    Progress Note Due on Visit 30    SLP Start Time 0930    SLP Stop Time  1015    SLP Time Calculation (min) 45 min    Activity Tolerance Patient tolerated treatment well           Patient Active Problem List   Diagnosis Date Noted   Benign prostatic hyperplasia with lower urinary tract symptoms 02/17/2020   Benign essential hypertension 11/24/2019   History of CVA (cerebrovascular accident) 07/08/2018   Lacunar infarct, 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 06/30/2018   Left leg weakness 06/29/2018   Parkinson disease (HCC) 06/29/2018   CAD (coronary artery disease), native coronary artery 06/29/2018   HTN (hypertension) 06/29/2018   Medicare annual wellness visit, initial 07/02/2017   B12 deficiency 06/25/2016   Hyperlipidemia, mixed 06/13/2015   Dyslipidemia 10/07/2012   GAD (generalized anxiety disorder) 10/07/2012   Insomnia 10/07/2012   Major depression 09/23/2012   Convulsions (HCC) 04/12/2012    ONSET DATE: 09/11/23   REFERRING DIAG: CVA  THERAPY DIAG:  Aphasia  Rationale for Evaluation and Treatment Rehabilitation  SUBJECTIVE:   SUBJECTIVE STATEMENT: Pt alert and cooperative.  Pt accompanied by: family member and wife and adult child   PERTINENT HISTORY: Jeffery Strong is a 83 y.o. male with past medical history including Parkinson's disease, prior CVA (07/08/2018), depression, HTN who presented to [facility] on 09/11/23 with aphasia and confusion. MRI  showed Multiple small infarcts in the left anterior temporal and parietal lobes and left MCA-PCA watershed distribution. Evaluated by SLP during acute stay with diet recommendation dysphagia IDDSI L6 soft and bite sized L0 Thin Liquids. SLP noted aphasia with primarily expressive deficits, but difficulty with complex comprehension. Previously followed by Dr. Maree with baseline cognitive deficits secondary to PD (SLUMS 25/30 in 2018), difficulty remembering names, memory deficits reported since 2014.  DIAGNOSTIC FINDINGS: See above  PAIN:  Are you having pain? No   FALLS: Has patient fallen in last 6 months?  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 Added several family names/pictures to SGD. Pt repeated single words with 80% accuracy, max cues. Pt labels nouns >10 nouns in pictures with mod A. Pt with emerging benefit of hierarchical verbal cues. Pt also participated in conversation about topic of interest. Pt benefits from key words and confirmation of messages. Increased verbal repetition of targets noted throughout session as well as repetition to clarify/confirm messages. Increased islands and error free speech.   Pt/daughter endorsed pt working on  HEP at home on SGD with assistance.  PATIENT EDUCATION: Education details: See today's treatment for details Person educated: patient, adult child, wife Education method: Explanation Education comprehension: verbalized understanding and needs further education  HOME EXERCISE PROGRAM:        HEP on TalkPath (on  SGD)   GOALS:  Goals reviewed with patient? Yes      SHORT TERM GOALS: Target date: 10 sessions  Patient will accurately respond to simple Y/N questions 80% accuracy with external supports as needed. Baseline: Goal status: MET   2.  Patient will complete standardized assessment of language (and cognition, as appropriate). Baseline:  Goal status: MET  3.  Patient will demonstrate awareness of communication breakdowns in 3/5 opportunities with usual moderate cues. Baseline:  Goal status: MET New goals 12/10/23   1.  With Min A, patient will answer (yes/no vs. open-ended questions) regarding basic personal information with 80% accuracy and use of total communication.  Baseline: Goal status: INITIAL      2. With max cueing, pt will follow 1-step commands with 70% accuracy.   Baseline:  Goal status: INITIAL  3. Pt will repeat single words with 80% accuracy with max cues.   Baseline:  Goal status: INITIAL  4. Pt will name common objects with 60% accuracy and max cues.   Baseline:  Goal status: INITIAL     LONG TERM GOALS: Target date: 12/17/23  With Moderate A, patient will verify messages when written keyword with yes/no question is provided in conversation with 80% accuracy to reduce communication breakdowns. Baseline:  Goal status: MET  2.  With Mod I, patient/family will demonstrate understanding of the following concepts: aphasia, spontaneous recovery, communication vs conversation, strengths/strategies to promote success, local resources by answering multiple choice questions with 90% accuracy when provided supported conversation in order to increase patient's participation in medical care. Baseline:  Goal status: MET  3.  Patient will effectively communicate wants/needs via multimodal means >80% accuracy with assistance from communication partners in order to improve expressive language. Baseline:  Goal status: MET  New goal 12/10/23; Target date 03/03/24 Pt  and/or family will demonstrate understanding of activities to promote language recovery/cognition at home.   Baseline:  Goal status: INTIAL          ASSESSMENT:  CLINICAL IMPRESSION: Patient is an 83 y.o. male who was seen today for aphasia treatment in setting of recent stroke. Based on assessment, Patient presents with severe Wernicke's aphasia impacting both expressive and receptive language. Pt continues to make steady progress and has secured a [employer] SGD for communication and home practice. Pt with supportive family who has been instrumental in his recovery. See details of tx session above. Patient would benefit from ongoing skilled ST to address language and cognitive impairments in order to increase ability to communicate wants and needs, follow commands, for increased safety and improved social connection with family and friends.  OBJECTIVE IMPAIRMENTS include attention, memory, awareness, expressive language, receptive language, and aphasia. These impairments are limiting patient from return to work, managing medications, managing appointments, managing finances, household responsibilities, ADLs/IADLs, and effectively communicating at home and in community. Factors affecting potential to achieve goals and functional outcome are ability to learn/carryover information, co-morbidities, cooperation/participation level, severity of impairments, and family/community support (+). Patient will benefit from skilled SLP services to address above impairments and improve overall function.  REHAB POTENTIAL: Good *with caregiver support  PLAN: SLP FREQUENCY: 2x/week  SLP DURATION: 12 weeks  PLANNED INTERVENTIONS: Cognitive reorganization, Internal/external aids,  Functional tasks, Multimodal communication approach, SLP instruction and feedback, Compensatory strategies, Patient/family education, and 07492 Treatment of speech (30 or 45 min)    Delon Bangs, M.S., CCC-SLP Speech-Language  Pathologist [place] - [facility] [phone] (ASCOM)

## 2023-12-31 ENCOUNTER — Ambulatory Visit: Attending: Internal Medicine

## 2023-12-31 ENCOUNTER — Ambulatory Visit

## 2023-12-31 DIAGNOSIS — R4701 Aphasia: Secondary | ICD-10-CM | POA: Diagnosis present

## 2023-12-31 NOTE — Therapy (Signed)
 OUTPATIENT SPEECH LANGUAGE PATHOLOGY  TREATMENT  Patient Name: Jeffery Strong MRN: [id number] DOB:11-05-1940, 83 y.o., male Today's Date: 12/31/2023  PCP: Jeffery Oneil FALCON, MD  REFERRING PROVIDER: Sheena Pod MD    End of Session - 12/31/23 1226     Visit Number 28    Number of Visits 49    Date for Recertification  03/03/24    Authorization Type UHC auth#: [id number] for 8 ST vst from 10/16-11/13 [facility] medicare 2025    Authorization - Visit Number 6    Authorization - Number of Visits 8    Progress Note Due on Visit 30    SLP Start Time 1015    SLP Stop Time  1100    SLP Time Calculation (min) 45 min    Activity Tolerance Patient tolerated treatment well           Patient Active Problem List   Diagnosis Date Noted   Benign prostatic hyperplasia with lower urinary tract symptoms 02/17/2020   Benign essential hypertension 11/24/2019   History of CVA (cerebrovascular accident) 07/08/2018   Lacunar infarct, 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 06/30/2018   Left leg weakness 06/29/2018   Parkinson disease (HCC) 06/29/2018   CAD (coronary artery disease), native coronary artery 06/29/2018   HTN (hypertension) 06/29/2018   Medicare annual wellness visit, initial 07/02/2017   B12 deficiency 06/25/2016   Hyperlipidemia, mixed 06/13/2015   Dyslipidemia 10/07/2012   GAD (generalized anxiety disorder) 10/07/2012   Insomnia 10/07/2012   Major depression 09/23/2012   Convulsions (HCC) 04/12/2012    ONSET DATE: 09/11/23   REFERRING DIAG: CVA  THERAPY DIAG:  No diagnosis found.  Rationale for Evaluation and Treatment Rehabilitation  SUBJECTIVE:   SUBJECTIVE STATEMENT: Pt alert and cooperative.  Pt accompanied by: adult child   PERTINENT HISTORY: Jeffery Strong is a 83 y.o. male with past medical history including Parkinson's disease, prior CVA (07/08/2018), depression, HTN who presented to [facility] on 09/11/23 with aphasia and confusion. MRI showed Multiple  small infarcts in the left anterior temporal and parietal lobes and left MCA-PCA watershed distribution. Evaluated by SLP during acute stay with diet recommendation dysphagia IDDSI L6 soft and bite sized L0 Thin Liquids. SLP noted aphasia with primarily expressive deficits, but difficulty with complex comprehension. Previously followed by Dr. Maree with baseline cognitive deficits secondary to PD (SLUMS 25/30 in 2018), difficulty remembering names, memory deficits reported since 2014.  DIAGNOSTIC FINDINGS: See above  PAIN:  Are you having pain? No   FALLS: Has patient fallen in last 6 months?  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 Pt with emerging benefit of hierarchical verbal cues. Pt repeated 7/10 single words with mod cueing. Pt required mod A to copy single words during naming task (functional writing). Pt also participated in conversation about topic of interest. Pt benefits from key words and confirmation of messages. Increased verbal repetition of targets noted throughout session as well as repetition to clarify/confirm messages. Increased islands and error free speech.   Pt/daughter endorsed pt working on HEP at  home on SGD with assistance.  PATIENT EDUCATION: Education details: See today's treatment for details Person educated: patient, adult child Education method: Explanation Education comprehension: verbalized understanding and needs further education  HOME EXERCISE PROGRAM:        HEP on TalkPath (on  SGD)   GOALS:  Goals reviewed with patient? Yes      SHORT TERM GOALS: Target date: 10 sessions  Patient will accurately respond to simple Y/N questions 80% accuracy with external supports as needed. Baseline: Goal status: MET   2.  Patient will complete standardized assessment of language (and cognition, as appropriate). Baseline:  Goal status: MET  3.  Patient will demonstrate awareness of communication breakdowns in 3/5 opportunities with usual moderate cues. Baseline:  Goal status: MET New goals 12/10/23   1.  With Min A, patient will answer (yes/no vs. open-ended questions) regarding basic personal information with 80% accuracy and use of total communication.  Baseline: Goal status: INITIAL      2. With max cueing, pt will follow 1-step commands with 70% accuracy.   Baseline:  Goal status: INITIAL  3. Pt will repeat single words with 80% accuracy with max cues.   Baseline:  Goal status: INITIAL  4. Pt will name common objects with 60% accuracy and max cues.   Baseline:  Goal status: INITIAL     LONG TERM GOALS: Target date: 12/17/23  With Moderate A, patient will verify messages when written keyword with yes/no question is provided in conversation with 80% accuracy to reduce communication breakdowns. Baseline:  Goal status: MET  2.  With Mod I, patient/family will demonstrate understanding of the following concepts: aphasia, spontaneous recovery, communication vs conversation, strengths/strategies to promote success, local resources by answering multiple choice questions with 90% accuracy when provided supported conversation in order to increase patient's participation in medical care. Baseline:  Goal status: MET  3.  Patient will effectively communicate wants/needs via multimodal means >80% accuracy with assistance from communication partners in order to improve expressive language. Baseline:  Goal status: MET  New goal 12/10/23; Target date 03/03/24 Pt  and/or family will demonstrate understanding of activities to promote language recovery/cognition at home.   Baseline:  Goal status: INTIAL          ASSESSMENT:  CLINICAL IMPRESSION: Patient is an 83 y.o. male who was seen today for aphasia treatment in setting of recent stroke. Based on assessment, Patient presents with severe Wernicke's aphasia impacting both expressive and receptive language. Pt continues to make steady progress and has secured a [employer] SGD for communication and home practice. Pt with supportive family who has been instrumental in his recovery. See details of tx session above. Patient would benefit from ongoing skilled ST to address language and cognitive impairments in order to increase ability to communicate wants and needs, follow commands, for increased safety and improved social connection with family and friends.  OBJECTIVE IMPAIRMENTS include attention, memory, awareness, expressive language, receptive language, and aphasia. These impairments are limiting patient from return to work, managing medications, managing appointments, managing finances, household responsibilities, ADLs/IADLs, and effectively communicating at home and in community. Factors affecting potential to achieve goals and functional outcome are ability to learn/carryover information, co-morbidities, cooperation/participation level, severity of impairments, and family/community support (+). Patient will benefit from skilled SLP services to address above impairments and improve overall function.  REHAB POTENTIAL: Good *with caregiver support  PLAN: SLP FREQUENCY: 2x/week  SLP DURATION: 12 weeks  PLANNED INTERVENTIONS: Cognitive reorganization, Internal/external aids, Functional tasks, Multimodal  communication approach, SLP instruction and feedback, Compensatory strategies, Patient/family education, and 07492 Treatment of speech (30 or 45 min)    Delon Bangs, M.S., CCC-SLP Speech-Language  Pathologist [place] - [facility] [phone] (ASCOM)

## 2024-01-02 ENCOUNTER — Ambulatory Visit

## 2024-01-02 DIAGNOSIS — R4701 Aphasia: Secondary | ICD-10-CM

## 2024-01-02 NOTE — Therapy (Signed)
 OUTPATIENT SPEECH LANGUAGE PATHOLOGY  TREATMENT  Patient Name: Jeffery Strong MRN: [id number] DOB:02-28-1940, 83 y.o., male Today's Date: 01/02/2024  PCP: Cleotilde Oneil FALCON, MD  REFERRING PROVIDER: Sheena Pod MD    End of Session - 01/02/24 1149     Visit Number 29    Number of Visits 49    Date for Recertification  03/03/24    Authorization Type UHC auth#: [id number] for 8 ST vst from 10/16-11/13 [facility] medicare 2025    Authorization - Visit Number 7    Authorization - Number of Visits 8    Progress Note Due on Visit 30    SLP Start Time 1010    SLP Stop Time  1100    SLP Time Calculation (min) 50 min    Activity Tolerance Patient tolerated treatment well           Patient Active Problem List   Diagnosis Date Noted   Benign prostatic hyperplasia with lower urinary tract symptoms 02/17/2020   Benign essential hypertension 11/24/2019   History of CVA (cerebrovascular accident) 07/08/2018   Lacunar infarct, 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 06/30/2018   Left leg weakness 06/29/2018   Parkinson disease (HCC) 06/29/2018   CAD (coronary artery disease), native coronary artery 06/29/2018   HTN (hypertension) 06/29/2018   Medicare annual wellness visit, initial 07/02/2017   B12 deficiency 06/25/2016   Hyperlipidemia, mixed 06/13/2015   Dyslipidemia 10/07/2012   GAD (generalized anxiety disorder) 10/07/2012   Insomnia 10/07/2012   Major depression 09/23/2012   Convulsions (HCC) 04/12/2012    ONSET DATE: 09/11/23   REFERRING DIAG: CVA  THERAPY DIAG:  Aphasia  Rationale for Evaluation and Treatment Rehabilitation  SUBJECTIVE:   SUBJECTIVE STATEMENT: Pt alert and cooperative.  Pt accompanied by: adult child and wife  PERTINENT HISTORY: Jeffery Strong is a 83 y.o. male with past medical history including Parkinson's disease, prior CVA (07/08/2018), depression, HTN who presented to [facility] on 09/11/23 with aphasia and confusion. MRI showed Multiple small  infarcts in the left anterior temporal and parietal lobes and left MCA-PCA watershed distribution. Evaluated by SLP during acute stay with diet recommendation dysphagia IDDSI L6 soft and bite sized L0 Thin Liquids. SLP noted aphasia with primarily expressive deficits, but difficulty with complex comprehension. Previously followed by Dr. Maree with baseline cognitive deficits secondary to PD (SLUMS 25/30 in 2018), difficulty remembering names, memory deficits reported since 2014.  DIAGNOSTIC FINDINGS: See above  PAIN:  Are you having pain? No   FALLS: Has patient fallen in last 6 months?  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 Pt labels nouns >10 nouns in pictures with mod A. Pt also participated in conversation about topic of interest. Pt benefits from key words and confirmation of messages. Increased verbal repetition of targets noted throughout session as well as repetition to clarify/confirm messages. Increased islands and error free speech. Reviewed communication strategies with pt, daughter, and wife as well as use of draw feature on SGD if pt does not have white board/pen and paper readily available. Daughter noted pt with a few important meetings/conversations  this week (e.g. insurance meeting, POA meeting). Reinforced use of supported communication for improved success and pt participation.   Pt/daughter endorsed pt working on HEP at home on SGD with assistance.   PATIENT EDUCATION: Education details: See today's treatment for details Person educated: patient, adult child,  wife Education method: Explanation Education comprehension: verbalized understanding and needs further education  HOME EXERCISE PROGRAM:        HEP on TalkPath (on SGD)   GOALS:  Goals reviewed with patient? Yes      SHORT TERM GOALS: Target date: 10 sessions  Patient will accurately respond to simple Y/N questions 80% accuracy with external supports as needed. Baseline: Goal status: MET   2.  Patient will complete standardized assessment of language (and cognition, as appropriate). Baseline:  Goal status: MET  3.  Patient will demonstrate awareness of communication breakdowns in 3/5 opportunities with usual moderate cues. Baseline:  Goal status: MET New goals 12/10/23   1.  With Min A, patient will answer (yes/no vs. open-ended questions) regarding basic personal information with 80% accuracy and use of total communication.  Baseline: Goal status: INITIAL      2. With max cueing, pt will follow 1-step commands with 70% accuracy.   Baseline:  Goal status: INITIAL  3. Pt will repeat single words with 80% accuracy with max cues.   Baseline:  Goal status: INITIAL  4. Pt will name common objects with 60% accuracy and max cues.   Baseline:  Goal status: INITIAL     LONG TERM GOALS: Target date: 12/17/23  With Moderate A, patient will verify messages when written keyword with yes/no question is provided in conversation with 80% accuracy to reduce communication breakdowns. Baseline:  Goal status: MET  2.  With Mod I, patient/family will demonstrate understanding of the following concepts: aphasia, spontaneous recovery, communication vs conversation, strengths/strategies to promote success, local resources by answering multiple choice questions with 90% accuracy when provided supported conversation in order to increase patient's participation in medical care. Baseline:  Goal status: MET  3.  Patient will effectively communicate wants/needs via multimodal means >80%  accuracy with assistance from communication partners in order to improve expressive language. Baseline:  Goal status: MET  New goal 12/10/23; Target date 03/03/24 Pt and/or family will demonstrate understanding of activities to promote language recovery/cognition at home.   Baseline:  Goal status: INTIAL          ASSESSMENT:  CLINICAL IMPRESSION: Patient is an 83 y.o. male who was seen today for aphasia treatment in setting of recent stroke. Based on assessment, Patient presents with severe Wernicke's aphasia impacting both expressive and receptive language. Pt continues to make steady progress and has secured a [employer] SGD for communication and home practice. Pt with supportive family who has been instrumental in his recovery. See details of tx session above. Patient would benefit from ongoing skilled ST to address language and cognitive impairments in order to increase ability to communicate wants and needs, follow commands, for increased safety and improved social connection with family and friends.  OBJECTIVE IMPAIRMENTS include attention, memory, awareness, expressive language, receptive language, and aphasia. These impairments are limiting patient from return to work, managing medications, managing appointments, managing finances, household responsibilities, ADLs/IADLs, and effectively communicating at home and in community. Factors affecting potential to achieve goals and functional outcome are ability to learn/carryover information, co-morbidities, cooperation/participation level, severity of impairments, and family/community support (+). Patient will benefit from skilled SLP services to address above impairments and improve  overall function.  REHAB POTENTIAL: Good *with caregiver support  PLAN: SLP FREQUENCY: 2x/week  SLP DURATION: 12 weeks  PLANNED INTERVENTIONS: Cognitive reorganization, Internal/external aids, Functional tasks, Multimodal communication approach, SLP  instruction and feedback, Compensatory strategies, Patient/family education, and [id number] Treatment of speech (30 or 45 min)    Delon Bangs, M.S., CCC-SLP Speech-Language Pathologist [place] - [facility] [phone] (ASCOM)

## 2024-01-07 ENCOUNTER — Ambulatory Visit

## 2024-01-07 DIAGNOSIS — R4701 Aphasia: Secondary | ICD-10-CM

## 2024-01-07 NOTE — Therapy (Signed)
 OUTPATIENT SPEECH LANGUAGE PATHOLOGY  TREATMENT   Patient Name: Jeffery Strong MRN: [id number] DOB:August 23, 1940, 83 y.o., male Today's Date: 01/07/2024  PCP: Cleotilde Oneil FALCON, MD  REFERRING PROVIDER: Sheena Pod MD    End of Session - 01/07/24 1145     Visit Number 30    Number of Visits 49    Date for Recertification  03/03/24    Authorization Type UHC auth#: [id number] for 8 ST vst from 10/16-11/13 [facility] medicare 2025    Authorization - Visit Number 8    Authorization - Number of Visits 8    Progress Note Due on Visit 30    SLP Start Time 1015    SLP Stop Time  1100    SLP Time Calculation (min) 45 min    Activity Tolerance Patient tolerated treatment well           Patient Active Problem List   Diagnosis Date Noted   Benign prostatic hyperplasia with lower urinary tract symptoms 02/17/2020   Benign essential hypertension 11/24/2019   History of CVA (cerebrovascular accident) 07/08/2018   Lacunar infarct, 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 06/30/2018   Left leg weakness 06/29/2018   Parkinson disease (HCC) 06/29/2018   CAD (coronary artery disease), native coronary artery 06/29/2018   HTN (hypertension) 06/29/2018   Medicare annual wellness visit, initial 07/02/2017   B12 deficiency 06/25/2016   Hyperlipidemia, mixed 06/13/2015   Dyslipidemia 10/07/2012   GAD (generalized anxiety disorder) 10/07/2012   Insomnia 10/07/2012   Major depression 09/23/2012   Convulsions (HCC) 04/12/2012    ONSET DATE: 09/11/23   REFERRING DIAG: CVA  THERAPY DIAG:  Aphasia  Rationale for Evaluation and Treatment Rehabilitation  SUBJECTIVE:   SUBJECTIVE STATEMENT: Pt alert and cooperative.  Pt accompanied by: adult child and wife  PERTINENT HISTORY: Jeffery Strong is a 83 y.o. male with past medical history including Parkinson's disease, prior CVA (07/08/2018), depression, HTN who presented to [facility] on 09/11/23 with aphasia and confusion. MRI showed Multiple small  infarcts in the left anterior temporal and parietal lobes and left MCA-PCA watershed distribution. Evaluated by SLP during acute stay with diet recommendation dysphagia IDDSI L6 soft and bite sized L0 Thin Liquids. SLP noted aphasia with primarily expressive deficits, but difficulty with complex comprehension. Previously followed by Dr. Maree with baseline cognitive deficits secondary to PD (SLUMS 25/30 in 2018), difficulty remembering names, memory deficits reported since 2014.  DIAGNOSTIC FINDINGS: See above  PAIN:  Are you having pain? No   FALLS: Has patient fallen in last 6 months?  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 Personal SGD has been approved per [employer] rep. Pt utilized during session to answer biographical question with min A. Reviewed HEP on [employer] with daugter. Pt repeated single words with 80% accuracy, improving to 100% with max cues; 2 and 3 word phrases with 50% accuracy, improving to 90% with max cues.   Pt also participated in conversation about topic of interest. Pt benefits from key words and confirmation of messages. Increased verbal repetition of targets noted throughout session as well  as repetition to clarify/confirm messages. Increased islands and error free speech.    PATIENT EDUCATION: Education details: See today's treatment for details Person educated: patient, adult child,  Education method: Explanation Education comprehension: verbalized understanding and needs further education  HOME EXERCISE PROGRAM:        HEP on [employer] (on  SGD)   GOALS:  Goals reviewed with patient? Yes      SHORT TERM GOALS: Target date: 10 sessions  Patient will accurately respond to simple Y/N questions 80% accuracy with external supports as needed. Baseline: Goal status: MET   2.  Patient will complete standardized assessment of language (and cognition, as appropriate). Baseline:  Goal status: MET  3.  Patient will demonstrate awareness of communication breakdowns in 3/5 opportunities with usual moderate cues. Baseline:  Goal status: MET New goals 12/10/23   1.  With Min A, patient will answer (yes/no vs. open-ended questions) regarding basic personal information with 80% accuracy and use of total communication.  Baseline: Goal status: PROGRESSING    2. With max cueing, pt will follow 1-step commands with 70% accuracy.   Baseline:  Goal status:PROGRESSING  3. Pt will repeat single words with 80% accuracy with max cues.   Baseline:  Goal status: MET - continue  4. Pt will name common objects with 60% accuracy and max cues.   Baseline:  Goal status: PROGRESSING     LONG TERM GOALS: Target date: 12/17/23  With Moderate A, patient will verify messages when written keyword with yes/no question is provided in conversation with 80% accuracy to reduce communication breakdowns. Baseline:  Goal status: MET  2.  With Mod I, patient/family will demonstrate understanding of the following concepts: aphasia, spontaneous recovery, communication vs conversation, strengths/strategies to promote success, local resources by answering multiple choice questions with 90% accuracy when provided supported conversation in order to increase patient's participation in medical care. Baseline:  Goal status: MET  3.  Patient will effectively communicate wants/needs via multimodal means >80% accuracy with assistance from communication partners in order to improve expressive language. Baseline:  Goal status: MET  New goal 12/10/23; Target  date 03/03/24 Pt and/or family will demonstrate understanding of activities to promote language recovery/cognition at home.   Baseline:  Goal status: PROGRESSING       ASSESSMENT:  CLINICAL IMPRESSION: Patient is an 83 y.o. male who was seen today for aphasia treatment in setting of recent stroke. Based on assessment, Patient presents with severe Wernicke's aphasia impacting both expressive and receptive language. Pt continues to make steady progress and has secured a [employer] SGD for communication and home practice. Pt with supportive family who has been instrumental in his recovery. See details of tx session above. Patient would benefit from ongoing skilled ST to address language and cognitive impairments in order to increase ability to communicate wants and needs, follow commands, for increased safety and improved social connection with family and friends.  OBJECTIVE IMPAIRMENTS include attention, memory, awareness, expressive language, receptive language, and aphasia. These impairments are limiting patient from return to work, managing medications, managing appointments, managing finances, household responsibilities, ADLs/IADLs, and effectively communicating at home and in community. Factors affecting potential to achieve goals and functional outcome are ability to learn/carryover information, co-morbidities, cooperation/participation level, severity of impairments, and family/community support (+). Patient will benefit from skilled SLP services to address above impairments and improve overall function.  REHAB POTENTIAL: Good *with caregiver support  PLAN: SLP FREQUENCY: 2x/week  SLP DURATION: 12 weeks  PLANNED INTERVENTIONS: Cognitive reorganization,  Internal/external aids, Functional tasks, Multimodal communication approach, SLP instruction and feedback, Compensatory strategies, Patient/family education, and 07492 Treatment of speech (30 or 45 min)    Delon Bangs, M.S.,  CCC-SLP Speech-Language Pathologist [place] [facility] [phone] (ASCOM)

## 2024-01-09 ENCOUNTER — Ambulatory Visit

## 2024-01-09 DIAGNOSIS — R4701 Aphasia: Secondary | ICD-10-CM

## 2024-01-09 NOTE — Therapy (Signed)
 OUTPATIENT SPEECH LANGUAGE PATHOLOGY  TREATMENT   Patient Name: Jeffery Strong MRN: [id number] DOB:[birth year], 83 y.o., male Today's Date: 01/09/2024  PCP: Cleotilde Oneil FALCON, MD  REFERRING PROVIDER: Sheena Pod MD    End of Session - 01/09/24 1208     Visit Number 31    Number of Visits 49    Date for Recertification  03/03/24    Authorization Type UHC auth#: [id number] for 8 SLP vst from 11/13-01/08/26  [facility] medicare 2025    Authorization - Visit Number 1    Authorization - Number of Visits 8    Progress Note Due on Visit 40    SLP Start Time 1015    SLP Stop Time  1100    SLP Time Calculation (min) 45 min    Activity Tolerance Patient tolerated treatment well           Patient Active Problem List   Diagnosis Date Noted   Benign prostatic hyperplasia with lower urinary tract symptoms 02/17/2020   Benign essential hypertension 11/24/2019   History of CVA (cerebrovascular accident) 07/08/2018   Lacunar infarct, 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 06/30/2018   Left leg weakness 06/29/2018   Parkinson disease (HCC) 06/29/2018   CAD (coronary artery disease), native coronary artery 06/29/2018   HTN (hypertension) 06/29/2018   Medicare annual wellness visit, initial 07/02/2017   B12 deficiency 06/25/2016   Hyperlipidemia, mixed 06/13/2015   Dyslipidemia 10/07/2012   GAD (generalized anxiety disorder) 10/07/2012   Insomnia 10/07/2012   Major depression 09/23/2012   Convulsions (HCC) 04/12/2012    ONSET DATE: 09/11/23   REFERRING DIAG: CVA  THERAPY DIAG:  Aphasia  Rationale for Evaluation and Treatment Rehabilitation  SUBJECTIVE:   SUBJECTIVE STATEMENT: Pt alert and cooperative.  Pt accompanied by: adult child and wife  PERTINENT HISTORY: Jeffery Strong is a 83 y.o. male with past medical history including Parkinson's disease, prior CVA (07/08/2018), depression, HTN who presented to [facility] on 09/11/23 with aphasia and confusion. MRI showed Multiple  small infarcts in the left anterior temporal and parietal lobes and left MCA-PCA watershed distribution. Evaluated by SLP during acute stay with diet recommendation dysphagia IDDSI L6 soft and bite sized L0 Thin Liquids. SLP noted aphasia with primarily expressive deficits, but difficulty with complex comprehension. Previously followed by Dr. Maree with baseline cognitive deficits secondary to PD (SLUMS 25/30 in 2018), difficulty remembering names, memory deficits reported since 2014.  DIAGNOSTIC FINDINGS: See above  PAIN:  Are you having pain? No   FALLS: Has patient fallen in last 6 months?  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 Personal SGD has been approved per [employer] rep. Pt utilized during session to answer biographical question with min A.  Pt repeated single words with 70% accuracy, improving to 100% with max cues; 2 word phrases 30% accuracy, improving to 75% with max cues.   Pt also participated in conversation about topic of interest. Pt benefits from key words and confirmation of messages. Increased verbal repetition of targets noted throughout session as well as repetition to clarify/confirm messages. Increased islands  and error free speech.    PATIENT EDUCATION: Education details: See today's treatment for details Person educated: patient, wife Education method: Explanation Education comprehension: verbalized understanding and needs further education  HOME EXERCISE PROGRAM:        HEP on [employer] (on SGD)   GOALS:  Goals reviewed with patient? Yes       SHORT TERM GOALS: Target date: 10 sessions  Patient will accurately respond to simple Y/N questions 80% accuracy with external supports as needed. Baseline: Goal status: MET   2.  Patient will complete standardized assessment of language (and cognition, as appropriate). Baseline:  Goal status: MET  3.  Patient will demonstrate awareness of communication breakdowns in 3/5 opportunities with usual moderate cues. Baseline:  Goal status: MET New goals 12/10/23   1.  With Min A, patient will answer (yes/no vs. open-ended questions) regarding basic personal information with 80% accuracy and use of total communication.  Baseline: Goal status: PROGRESSING    2. With max cueing, pt will follow 1-step commands with 70% accuracy.   Baseline:  Goal status:PROGRESSING  3. Pt will repeat single words with 80% accuracy with max cues.   Baseline:  Goal status: MET - continue  4. Pt will name common objects with 60% accuracy and max cues.   Baseline:  Goal status: PROGRESSING     LONG TERM GOALS: Target date: 12/17/23  With Moderate A, patient will verify messages when written keyword with yes/no question is provided in conversation with 80% accuracy to reduce communication breakdowns. Baseline:  Goal status: MET  2.  With Mod I, patient/family will demonstrate understanding of the following concepts: aphasia, spontaneous recovery, communication vs conversation, strengths/strategies to promote success, local resources by answering multiple choice questions with 90% accuracy when provided supported conversation in order to increase patient's participation in medical care. Baseline:  Goal status: MET  3.  Patient will effectively communicate wants/needs via multimodal means >80% accuracy with assistance from communication partners in order to improve expressive language. Baseline:  Goal status: MET  New goal 12/10/23; Target date 03/03/24 Pt and/or family will demonstrate  understanding of activities to promote language recovery/cognition at home.   Baseline:  Goal status: PROGRESSING       ASSESSMENT:  CLINICAL IMPRESSION: Patient is an 83 y.o. male who was seen today for aphasia treatment in setting of recent stroke. Based on assessment, Patient presents with severe Wernicke's aphasia impacting both expressive and receptive language. Pt continues to make steady progress and has secured a [employer] SGD for communication and home practice. Pt with supportive family who has been instrumental in his recovery. See details of tx session above. Patient would benefit from ongoing skilled ST to address language and cognitive impairments in order to increase ability to communicate wants and needs, follow commands, for increased safety and improved social connection with family and friends.  OBJECTIVE IMPAIRMENTS include attention, memory, awareness, expressive language, receptive language, and aphasia. These impairments are limiting patient from return to work, managing medications, managing appointments, managing finances, household responsibilities, ADLs/IADLs, and effectively communicating at home and in community. Factors affecting potential to achieve goals and functional outcome are ability to learn/carryover information, co-morbidities, cooperation/participation level, severity of impairments, and family/community support (+). Patient will benefit from skilled SLP services to address above impairments and improve overall function.  REHAB POTENTIAL: Good *with caregiver support  PLAN: SLP FREQUENCY: 2x/week  SLP DURATION: 12 weeks  PLANNED INTERVENTIONS: Cognitive reorganization, Internal/external aids, Functional tasks, Multimodal communication approach, SLP instruction  and feedback, Compensatory strategies, Patient/family education, and 07492 Treatment of speech (30 or 45 min)    Delon Bangs, M.S., CCC-SLP Speech-Language Pathologist [place] -  [facility] [phone] (ASCOM)

## 2024-01-14 ENCOUNTER — Ambulatory Visit

## 2024-01-14 DIAGNOSIS — R4701 Aphasia: Secondary | ICD-10-CM

## 2024-01-14 NOTE — Therapy (Signed)
 OUTPATIENT SPEECH LANGUAGE PATHOLOGY  TREATMENT   Patient Name: Jeffery Strong MRN: [id number] DOB:1940/06/01, 83 y.o., male Today's Date: 01/14/2024  PCP: Cleotilde Oneil FALCON, MD  REFERRING PROVIDER: Sheena Pod MD    End of Session - 01/14/24 1047     Visit Number 32    Number of Visits 49    Date for Recertification  03/03/24    Authorization Type UHC auth#: [id number] for 8 SLP vst from 11/13-01/08/26  [facility] medicare 2025    Authorization - Visit Number 2    Authorization - Number of Visits 8    Progress Note Due on Visit 40    SLP Start Time 1000    SLP Stop Time  1045    SLP Time Calculation (min) 45 min           Patient Active Problem List   Diagnosis Date Noted   Benign prostatic hyperplasia with lower urinary tract symptoms 02/17/2020   Benign essential hypertension 11/24/2019   History of CVA (cerebrovascular accident) 07/08/2018   Lacunar infarct, 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 06/30/2018   Left leg weakness 06/29/2018   Parkinson disease (HCC) 06/29/2018   CAD (coronary artery disease), native coronary artery 06/29/2018   HTN (hypertension) 06/29/2018   Medicare annual wellness visit, initial 07/02/2017   B12 deficiency 06/25/2016   Hyperlipidemia, mixed 06/13/2015   Dyslipidemia 10/07/2012   GAD (generalized anxiety disorder) 10/07/2012   Insomnia 10/07/2012   Major depression 09/23/2012   Convulsions (HCC) 04/12/2012    ONSET DATE: 09/11/23   REFERRING DIAG: CVA  THERAPY DIAG:  Aphasia  Rationale for Evaluation and Treatment Rehabilitation  SUBJECTIVE:   SUBJECTIVE STATEMENT: Pt alert and cooperative.  Pt accompanied by: adult child and wife  PERTINENT HISTORY: Jeffery Strong is a 83 y.o. male with past medical history including Parkinson's disease, prior CVA (07/08/2018), depression, HTN who presented to [facility] on 09/11/23 with aphasia and confusion. MRI showed Multiple small infarcts in the left anterior temporal and parietal  lobes and left MCA-PCA watershed distribution. Evaluated by SLP during acute stay with diet recommendation dysphagia IDDSI L6 soft and bite sized L0 Thin Liquids. SLP noted aphasia with primarily expressive deficits, but difficulty with complex comprehension. Previously followed by Dr. Maree with baseline cognitive deficits secondary to PD (SLUMS 25/30 in 2018), difficulty remembering names, memory deficits reported since 2014.  DIAGNOSTIC FINDINGS: See above  PAIN:  Are you having pain? No   FALLS: Has patient fallen in last 6 months?  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 SLP ensure syncing is complete. Reviewed return process for loaner SGD and styluses. Modification to cards, projection, volume, etc made for easier use of new SGD by patient. Pt shown newly added cards/folders to help facilitate ease of communication with communication partners at home and in the community. Pt required min cues to navigate individual folders, mod cues to navigate between tabs.   Speech/Language Therapy: Pt utilized during session to answer biographical question with min A.  Pt repeated single words with >90% accuracy, improving to 100% with min/mod. 2Pt also participated in conversation about topic  of interest. Pt benefits from key words and confirmation of messages. Increased verbal repetition of targets noted throughout session as well as repetition to clarify/confirm messages. Increased islands and error free speech.    PATIENT EDUCATION: Education details: See today's treatment for details Person educated: patient,  wife Education method: Explanation Education comprehension: verbalized understanding and needs further education  HOME EXERCISE PROGRAM:        HEP on [employer] (on SGD)   GOALS:  Goals reviewed with patient? Yes      SHORT TERM GOALS: Target date: 10 sessions  Patient will accurately respond to simple Y/N questions 80% accuracy with external supports as needed. Baseline: Goal status: MET   2.  Patient will complete standardized assessment of language (and cognition, as appropriate). Baseline:  Goal status: MET  3.  Patient will demonstrate awareness of communication breakdowns in 3/5 opportunities with usual moderate cues. Baseline:  Goal status: MET New goals 12/10/23   1.  With Min A, patient will answer (yes/no vs. open-ended questions) regarding basic personal information with 80% accuracy and use of total communication.  Baseline: Goal status: PROGRESSING    2. With max cueing, pt will follow 1-step commands with 70% accuracy.   Baseline:  Goal status:PROGRESSING  3. Pt will repeat single words with 80% accuracy with max cues.   Baseline:  Goal status: MET - continue  4. Pt will name common objects with 60% accuracy and max cues.   Baseline:  Goal status: PROGRESSING     LONG TERM GOALS: Target date: 12/17/23  With Moderate A, patient will verify messages when written keyword with yes/no question is provided in conversation with 80% accuracy to reduce communication breakdowns. Baseline:  Goal status: MET  2.  With Mod I, patient/family will demonstrate understanding of the following concepts: aphasia, spontaneous recovery, communication vs conversation, strengths/strategies to promote success, local resources by answering multiple choice questions with 90% accuracy when provided supported conversation in order to increase patient's participation in medical care. Baseline:  Goal status: MET  3.  Patient will effectively communicate wants/needs via  multimodal means >80% accuracy with assistance from communication partners in order to improve expressive language. Baseline:  Goal status: MET  New goal 12/10/23; Target date 03/03/24 Pt and/or family will demonstrate understanding of activities to promote language recovery/cognition at home.   Baseline:  Goal status: PROGRESSING       ASSESSMENT:  CLINICAL IMPRESSION: Patient is an 83 y.o. male who was seen today for aphasia treatment in setting of recent stroke. Based on assessment, Patient presents with severe Wernicke's aphasia impacting both expressive and receptive language. Pt continues to make steady progress and has secured a [employer] SGD for communication and home practice. Pt with supportive family who has been instrumental in his recovery. See details of tx session above. Patient would benefit from ongoing skilled ST to address language and cognitive impairments in order to increase ability to communicate wants and needs, follow commands, for increased safety and improved social connection with family and friends.  OBJECTIVE IMPAIRMENTS include attention, memory, awareness, expressive language, receptive language, and aphasia. These impairments are limiting patient from return to work, managing medications, managing appointments, managing finances, household responsibilities, ADLs/IADLs, and effectively communicating at home and in community. Factors affecting potential to achieve goals and functional outcome are ability to learn/carryover information, co-morbidities, cooperation/participation level, severity of impairments, and family/community support (+). Patient will benefit from skilled SLP services to address above impairments and improve overall function.  REHAB POTENTIAL:  Good *with caregiver support  PLAN: SLP FREQUENCY: 2x/week  SLP DURATION: 12 weeks  PLANNED INTERVENTIONS: Cognitive reorganization, Internal/external aids, Functional tasks, Multimodal communication  approach, SLP instruction and feedback, Compensatory strategies, Patient/family education, and 07492 Treatment of speech (30 or 45 min)    Delon Bangs, M.S., CCC-SLP Speech-Language Pathologist [place] - [facility] [phone] (ASCOM)

## 2024-01-16 ENCOUNTER — Ambulatory Visit

## 2024-01-16 DIAGNOSIS — R4701 Aphasia: Secondary | ICD-10-CM

## 2024-01-16 NOTE — Therapy (Signed)
 OUTPATIENT SPEECH LANGUAGE PATHOLOGY  TREATMENT   Patient Name: CINSERE MIZRAHI MRN: [id number] DOB:1940/10/17, 83 y.o., male Today's Date: 01/16/2024  PCP: Cleotilde Oneil FALCON, MD  REFERRING PROVIDER: Sheena Pod MD    End of Session - 01/16/24 1002     Visit Number 33    Number of Visits 49    Date for Recertification  03/03/24    Authorization Type UHC auth#: [id number] for 8 SLP vst from 11/13-01/08/26  [facility] medicare 2025    Authorization - Visit Number 3    Authorization - Number of Visits 8    Progress Note Due on Visit 40    SLP Start Time 1005    SLP Stop Time  1050    SLP Time Calculation (min) 45 min    Activity Tolerance Patient tolerated treatment well           Patient Active Problem List   Diagnosis Date Noted   Benign prostatic hyperplasia with lower urinary tract symptoms 02/17/2020   Benign essential hypertension 11/24/2019   History of CVA (cerebrovascular accident) 07/08/2018   Lacunar infarct, 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 06/30/2018   Left leg weakness 06/29/2018   Parkinson disease (HCC) 06/29/2018   CAD (coronary artery disease), native coronary artery 06/29/2018   HTN (hypertension) 06/29/2018   Medicare annual wellness visit, initial 07/02/2017   B12 deficiency 06/25/2016   Hyperlipidemia, mixed 06/13/2015   Dyslipidemia 10/07/2012   GAD (generalized anxiety disorder) 10/07/2012   Insomnia 10/07/2012   Major depression 09/23/2012   Convulsions (HCC) 04/12/2012    ONSET DATE: 09/11/23   REFERRING DIAG: CVA  THERAPY DIAG:  Aphasia  Rationale for Evaluation and Treatment Rehabilitation  SUBJECTIVE:   SUBJECTIVE STATEMENT: Pt alert and cooperative.  Pt accompanied by: adult child and wife  PERTINENT HISTORY: Akon Reinoso is a 83 y.o. male with past medical history including Parkinson's disease, prior CVA (07/08/2018), depression, HTN who presented to [facility] on 09/11/23 with aphasia and confusion. MRI showed Multiple  small infarcts in the left anterior temporal and parietal lobes and left MCA-PCA watershed distribution. Evaluated by SLP during acute stay with diet recommendation dysphagia IDDSI L6 soft and bite sized L0 Thin Liquids. SLP noted aphasia with primarily expressive deficits, but difficulty with complex comprehension. Previously followed by Dr. Maree with baseline cognitive deficits secondary to PD (SLUMS 25/30 in 2018), difficulty remembering names, memory deficits reported since 2014.  DIAGNOSTIC FINDINGS: See above  PAIN:  Are you having pain? No   FALLS: Has patient fallen in last 6 months?  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   PATIENT GOALS   (patient states nothing, spouse states we want to be able to understand him)  OBJECTIVE:   Speech/Language Therapy: Pt utilized during session to answer biographical question with min A.  Pt repeated  2-word phrases with 50% accuracy, improving to 90% with max verbal cues; 3-word phrases with 40% accuracy, improving to 70% with max verbal cues. Close approximations noted. Pt named common objects with 10% accuracy indep; 70% accuracy with max verbal cues. Pt followed 1-step commands in context with ~60% accuracy with visual cueing.  Pt also participated in conversation about topic of interest. Pt benefits from key words and confirmation of messages. Increased verbal repetition of targets  noted throughout session as well as repetition to clarify/confirm messages. Increased islands and error free speech.    PATIENT EDUCATION: Education details: See today's treatment for details Person educated: patient, wife Education method: Explanation Education comprehension: verbalized understanding and needs further education  HOME  EXERCISE PROGRAM:        HEP on TalkPath (on SGD)   GOALS:  Goals reviewed with patient? Yes      SHORT TERM GOALS: Target date: 10 sessions  Patient will accurately respond to simple Y/N questions 80% accuracy with external supports as needed. Baseline: Goal status: MET   2.  Patient will complete standardized assessment of language (and cognition, as appropriate). Baseline:  Goal status: MET  3.  Patient will demonstrate awareness of communication breakdowns in 3/5 opportunities with usual moderate cues. Baseline:  Goal status: MET New goals 12/10/23   1.  With Min A, patient will answer (yes/no vs. open-ended questions) regarding basic personal information with 80% accuracy and use of total communication.  Baseline: Goal status: PROGRESSING    2. With max cueing, pt will follow 1-step commands with 70% accuracy.   Baseline:  Goal status:PROGRESSING  3. Pt will repeat single words with 80% accuracy with max cues.   Baseline:  Goal status: MET - continue  4. Pt will name common objects with 60% accuracy and max cues.   Baseline:  Goal status: PROGRESSING     LONG TERM GOALS: Target date: 12/17/23  With Moderate A, patient will verify messages when written keyword with yes/no question is provided in conversation with 80% accuracy to reduce communication breakdowns. Baseline:  Goal status: MET  2.  With Mod I, patient/family will demonstrate understanding of the following concepts: aphasia, spontaneous recovery, communication vs conversation, strengths/strategies to promote success, local resources by answering multiple choice questions with 90% accuracy when provided supported conversation in order to increase patient's participation in medical care. Baseline:  Goal status: MET  3.  Patient will effectively communicate wants/needs via multimodal means >80% accuracy with assistance from communication partners in order to improve expressive language. Baseline:   Goal status: MET  New goal 12/10/23; Target date 03/03/24 Pt and/or family will demonstrate understanding of activities to promote language recovery/cognition at home.   Baseline:  Goal status: PROGRESSING       ASSESSMENT:  CLINICAL IMPRESSION: Patient is an 83 y.o. male who was seen today for aphasia treatment in setting of recent stroke. Based on assessment, Patient presents with severe Wernicke's aphasia impacting both expressive and receptive language. Pt continues to make steady progress and has secured a [employer] SGD for communication and home practice. Pt with supportive family who has been instrumental in his recovery. See details of tx session above. Patient would benefit from ongoing skilled ST to address language and cognitive impairments in order to increase ability to communicate wants and needs, follow commands, for increased safety and improved social connection with family and friends.  OBJECTIVE IMPAIRMENTS include attention, memory, awareness, expressive language, receptive language, and aphasia. These impairments are limiting patient from return to work, managing medications, managing appointments, managing finances, household responsibilities, ADLs/IADLs, and effectively communicating at home and in community. Factors affecting potential to achieve goals and functional outcome are ability to learn/carryover information, co-morbidities, cooperation/participation level, severity of impairments, and family/community support (+). Patient will benefit from skilled SLP services to address above impairments and improve overall function.  REHAB POTENTIAL: Good *with caregiver support  PLAN: SLP FREQUENCY: 2x/week  SLP DURATION: 12 weeks  PLANNED  INTERVENTIONS: Cognitive reorganization, Internal/external aids, Functional tasks, Multimodal communication approach, SLP instruction and feedback, Compensatory strategies, Patient/family education, and 07492 Treatment of speech (30  or 45 min)    Delon Bangs, M.S., CCC-SLP Speech-Language Pathologist [facility] [phone] (ASCOM)

## 2024-01-21 ENCOUNTER — Ambulatory Visit

## 2024-01-28 ENCOUNTER — Ambulatory Visit: Attending: Internal Medicine

## 2024-01-28 ENCOUNTER — Ambulatory Visit

## 2024-01-28 DIAGNOSIS — R4701 Aphasia: Secondary | ICD-10-CM | POA: Insufficient documentation

## 2024-01-28 NOTE — Therapy (Signed)
 OUTPATIENT SPEECH LANGUAGE PATHOLOGY  TREATMENT   Patient Name: Jeffery Strong MRN: [id number] DOB:07-03-1940, 83 y.o., male Today's Date: 01/28/2024  PCP: Cleotilde Oneil FALCON, MD  REFERRING PROVIDER: Sheena Pod MD    End of Session - 01/28/24 0952     Visit Number 34    Number of Visits 49    Date for Recertification  03/03/24    Authorization Type UHC auth#: [id number] for 8 SLP vst from 11/13-01/08/26  [facility] medicare 2025    Authorization - Visit Number 4    Authorization - Number of Visits 8    Progress Note Due on Visit 40    SLP Start Time 1000    SLP Stop Time  1045    SLP Time Calculation (min) 45 min    Activity Tolerance Patient tolerated treatment well           Patient Active Problem List   Diagnosis Date Noted   Benign prostatic hyperplasia with lower urinary tract symptoms 02/17/2020   Benign essential hypertension 11/24/2019   History of CVA (cerebrovascular accident) 07/08/2018   Lacunar infarct, 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 06/30/2018   Left leg weakness 06/29/2018   Parkinson disease (HCC) 06/29/2018   CAD (coronary artery disease), native coronary artery 06/29/2018   HTN (hypertension) 06/29/2018   Medicare annual wellness visit, initial 07/02/2017   B12 deficiency 06/25/2016   Hyperlipidemia, mixed 06/13/2015   Dyslipidemia 10/07/2012   GAD (generalized anxiety disorder) 10/07/2012   Insomnia 10/07/2012   Major depression 09/23/2012   Convulsions (HCC) 04/12/2012    ONSET DATE: 09/11/23   REFERRING DIAG: CVA  THERAPY DIAG:  Aphasia  Rationale for Evaluation and Treatment Rehabilitation  SUBJECTIVE:   SUBJECTIVE STATEMENT: Pt alert and cooperative.  Pt accompanied by: adult child and wife  PERTINENT HISTORY: Jeffery Strong is a 83 y.o. male with past medical history including Parkinson's disease, prior CVA (07/08/2018), depression, HTN who presented to [facility] on 09/11/23 with aphasia and confusion. MRI showed Multiple  small infarcts in the left anterior temporal and parietal lobes and left MCA-PCA watershed distribution. Evaluated by SLP during acute stay with diet recommendation dysphagia IDDSI L6 soft and bite sized L0 Thin Liquids. SLP noted aphasia with primarily expressive deficits, but difficulty with complex comprehension. Previously followed by Dr. Maree with baseline cognitive deficits secondary to PD (SLUMS 25/30 in 2018), difficulty remembering names, memory deficits reported since 2014.  DIAGNOSTIC FINDINGS: See above  PAIN:  Are you having pain? No   FALLS: Has patient fallen in last 6 months?  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  Pt repeated 2-word phrases with 50% accuracy, improving to 90% with max verbal cues; 3-word phrases with 40% accuracy, improving to 70% with max verbal cues. Close approximations noted. Pt named common objects with 30% accuracy indep; 70% accuracy with max verbal cues. Pt followed 1-step commands in context with ~60% accuracy with visual cueing.  Pt also participated in conversation about topic of interest. Pt benefits from key words and confirmation of messages. Increased verbal  repetition of targets noted throughout session as well as repetition to clarify/confirm messages. Increased islands and error free speech.    PATIENT EDUCATION: Education details: See today's treatment for details Person educated: patient, wife Education method: Explanation Education comprehension: verbalized understanding and needs  further education  HOME EXERCISE PROGRAM:        HEP on TalkPath (on SGD)   GOALS:  Goals reviewed with patient? Yes      SHORT TERM GOALS: Target date: 10 sessions  Patient will accurately respond to simple Y/N questions 80% accuracy with external supports as needed. Baseline: Goal status: MET   2.  Patient will complete standardized assessment of language (and cognition, as appropriate). Baseline:  Goal status: MET  3.  Patient will demonstrate awareness of communication breakdowns in 3/5 opportunities with usual moderate cues. Baseline:  Goal status: MET New goals 12/10/23   1.  With Min A, patient will answer (yes/no vs. open-ended questions) regarding basic personal information with 80% accuracy and use of total communication.  Baseline: Goal status: PROGRESSING    2. With max cueing, pt will follow 1-step commands with 70% accuracy.   Baseline:  Goal status:PROGRESSING  3. Pt will repeat single words with 80% accuracy with max cues.   Baseline:  Goal status: MET - continue  4. Pt will name common objects with 60% accuracy and max cues.   Baseline:  Goal status: PROGRESSING     LONG TERM GOALS: Target date: 12/17/23  With Moderate A, patient will verify messages when written keyword with yes/no question is provided in conversation with 80% accuracy to reduce communication breakdowns. Baseline:  Goal status: MET  2.  With Mod I, patient/family will demonstrate understanding of the following concepts: aphasia, spontaneous recovery, communication vs conversation, strengths/strategies to promote success, local resources by answering multiple choice questions with 90% accuracy when provided supported conversation in order to increase patient's participation in medical care. Baseline:  Goal status: MET  3.  Patient will effectively communicate wants/needs via multimodal means >80% accuracy with assistance from communication partners in order to improve  expressive language. Baseline:  Goal status: MET  New goal 12/10/23; Target date 03/03/24 Pt and/or family will demonstrate understanding of activities to promote language recovery/cognition at home.   Baseline:  Goal status: PROGRESSING       ASSESSMENT:  CLINICAL IMPRESSION: Patient is an 83 y.o. male who was seen today for aphasia treatment in setting of recent stroke. Based on assessment, Patient presents with severe Wernicke's aphasia impacting both expressive and receptive language. Pt continues to make steady progress and has secured a [employer] SGD for communication and home practice. Pt with supportive family who has been instrumental in his recovery. See details of tx session above. Patient would benefit from ongoing skilled ST to address language and cognitive impairments in order to increase ability to communicate wants and needs, follow commands, for increased safety and improved social connection with family and friends.  OBJECTIVE IMPAIRMENTS include attention, memory, awareness, expressive language, receptive language, and aphasia. These impairments are limiting patient from return to work, managing medications, managing appointments, managing finances, household responsibilities, ADLs/IADLs, and effectively communicating at home and in community. Factors affecting potential to achieve goals and functional outcome are ability to learn/carryover information, co-morbidities, cooperation/participation level, severity of impairments, and family/community support (+). Patient will benefit from skilled SLP services to address above impairments and improve overall function.  REHAB POTENTIAL: Good *with caregiver support  PLAN: SLP FREQUENCY: 2x/week  SLP DURATION: 12  weeks  PLANNED INTERVENTIONS: Cognitive reorganization, Internal/external aids, Functional tasks, Multimodal communication approach, SLP instruction and feedback, Compensatory strategies, Patient/family education, and  07492 Treatment of speech (30 or 45 min)    Delon Bangs, M.S., CCC-SLP Speech-Language Pathologist [facility] [phone] (ASCOM)

## 2024-01-30 ENCOUNTER — Ambulatory Visit

## 2024-02-04 ENCOUNTER — Ambulatory Visit

## 2024-02-06 ENCOUNTER — Ambulatory Visit

## 2024-02-11 ENCOUNTER — Ambulatory Visit

## 2024-02-11 DIAGNOSIS — R4701 Aphasia: Secondary | ICD-10-CM | POA: Diagnosis not present

## 2024-02-11 NOTE — Therapy (Signed)
 OUTPATIENT SPEECH LANGUAGE PATHOLOGY  TREATMENT / DISCHARGE SUMMARY  Patient Name: Jeffery Strong MRN: [id number] DOB:10/04/1940, 83 y.o., male Today's Date: 02/11/2024  PCP: Cleotilde Oneil FALCON, MD  REFERRING PROVIDER: Sheena Pod MD    End of Session - 02/11/24 1002     Visit Number 35    Number of Visits 49    Date for Recertification  03/03/24    Authorization Type UHC auth #: [id number] for 4 SLP vst from 12/16-01/13/26    Authorization - Visit Number 1    Authorization - Number of Visits 4    Progress Note Due on Visit 40    SLP Start Time 1010    SLP Stop Time  1100    SLP Time Calculation (min) 50 min    Activity Tolerance Patient tolerated treatment well           Patient Active Problem List   Diagnosis Date Noted   Benign prostatic hyperplasia with lower urinary tract symptoms 02/17/2020   Benign essential hypertension 11/24/2019   History of CVA (cerebrovascular accident) 07/08/2018   Lacunar infarct, 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 06/30/2018   Left leg weakness 06/29/2018   Parkinson disease (HCC) 06/29/2018   CAD (coronary artery disease), native coronary artery 06/29/2018   HTN (hypertension) 06/29/2018   Medicare annual wellness visit, initial 07/02/2017   B12 deficiency 06/25/2016   Hyperlipidemia, mixed 06/13/2015   Dyslipidemia 10/07/2012   GAD (generalized anxiety disorder) 10/07/2012   Insomnia 10/07/2012   Major depression 09/23/2012   Convulsions (HCC) 04/12/2012    ONSET DATE: 09/11/23   REFERRING DIAG: CVA  THERAPY DIAG:  Aphasia  Rationale for Evaluation and Treatment Rehabilitation  SUBJECTIVE:   SUBJECTIVE STATEMENT: Pt alert and cooperative.  Pt accompanied by: adult child and wife  PERTINENT HISTORY: Jeffery Strong is a 83 y.o. male with past medical history including Parkinson's disease, prior CVA (07/08/2018), depression, HTN who presented to [facility] on 09/11/23 with aphasia and confusion. MRI showed Multiple  small infarcts in the left anterior temporal and parietal lobes and left MCA-PCA watershed distribution. Evaluated by SLP during acute stay with diet recommendation dysphagia IDDSI L6 soft and bite sized L0 Thin Liquids. SLP noted aphasia with primarily expressive deficits, but difficulty with complex comprehension. Previously followed by Dr. Maree with baseline cognitive deficits secondary to PD (SLUMS 25/30 in 2018), difficulty remembering names, memory deficits reported since 2014.  DIAGNOSTIC FINDINGS: See above  PAIN:  Are you having pain? No   FALLS: Has patient fallen in last 6 months?  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 Both appreciative of progress to date; however, given colder weather and holiday season, they would like to resume therapy in ~1 month.   Education provided re: SLP POC, progress to date, ways to promote speech/cognition outside of tx, and HEP.   Pt also participated in conversation about topic of interest. Pt benefits from key words and confirmation of messages. Increased verbal repetition of targets noted throughout session as well as repetition to clarify/confirm messages. Increased islands and error free speech.  PATIENT EDUCATION: Education details: See today's treatment for details Person educated: patient, wife Education method: Explanation Education comprehension: verbalized understanding and needs further education  HOME EXERCISE PROGRAM:        HEP on TalkPath (on SGD)   GOALS:  Goals reviewed with patient? Yes      SHORT TERM  GOALS: Target date: 10 sessions  Patient will accurately respond to simple Y/N questions 80% accuracy with external supports as needed. Baseline: Goal status: MET   2.  Patient will complete standardized assessment of language (and cognition, as appropriate). Baseline:  Goal status: MET  3.  Patient will demonstrate awareness of communication breakdowns in 3/5 opportunities with usual moderate cues. Baseline:  Goal status: MET New goals 12/10/23   1.  With Min A, patient will answer (yes/no vs. open-ended questions) regarding basic personal information with 80% accuracy and use of total communication.  Baseline: Goal status: MET  2. With max cueing, pt will follow 1-step commands with 70% accuracy.   Baseline:  Goal status:MET  3. Pt will repeat single words with 80% accuracy with max cues.   Baseline:  Goal status: MET  4. Pt will name common objects with 60% accuracy and max cues.   Baseline:  Goal status: PROGRESSING     LONG TERM GOALS: Target date: 12/17/23  With Moderate A, patient will verify messages when written keyword with yes/no question is provided in conversation with 80% accuracy to reduce communication breakdowns. Baseline:  Goal status: MET  2.  With Mod I, patient/family will demonstrate understanding of the following concepts: aphasia, spontaneous recovery, communication vs conversation, strengths/strategies to promote success, local resources by answering multiple choice questions with 90% accuracy when provided supported conversation in order to increase patient's participation in medical care. Baseline:  Goal status: MET  3.  Patient will effectively communicate wants/needs via multimodal means >80% accuracy with assistance from communication partners in order to improve expressive language. Baseline:  Goal status: MET  New goal 12/10/23; Target date 03/03/24 Pt and/or family will demonstrate understanding of activities to promote language  recovery/cognition at home.   Baseline:  Goal status: MET       ASSESSMENT:  CLINICAL IMPRESSION: Patient is an 83 y.o. male who was seen today for aphasia treatment in setting of recent stroke. Based on assessment, Patient presents with severe Wernicke's aphasia impacting both expressive and receptive language. Pt continues to make steady progress and has secured a [employer] SGD for communication and home practice. Pt with supportive family who has been instrumental in his recovery. Pt continues to make steady progress toward goals. See details of tx session above. Patient would benefit from ongoing skilled ST to address language and cognitive impairments in order to increase ability to communicate wants and needs, follow commands, for increased safety and improved social connection with family and friends; however, pt wishing to take a break from therapy at this time. Pt and daughter encouraged to secure order to resume therapy when pt ready to retrun.  OBJECTIVE IMPAIRMENTS include attention, memory, awareness, expressive language, receptive language, and aphasia. These impairments are limiting patient from return to work, managing medications, managing appointments, managing finances, household responsibilities, ADLs/IADLs, and effectively communicating at home and in community. Factors affecting potential to achieve goals and functional outcome are ability to learn/carryover information, co-morbidities, cooperation/participation level, severity of impairments, and family/community support (+). Patient will benefit from skilled SLP services to address above impairments and improve overall function.  REHAB POTENTIAL: Good *with caregiver support  PLAN: D/C therapy at this time; resume when pt ready; will need new order   Delon Bangs, M.S., CCC-SLP Speech-Language Pathologist [facility] [phone] (ASCOM)

## 2024-02-12 NOTE — Progress Notes (Signed)
 "                                    Patient Profile:   Jeffery Strong  is a 83 y.o.  male Chief Complaint  Patient presents with   Annual Exam      PROBLEM LIST: Past Medical History:  Diagnosis Date   Coronary atherosclerosis of native coronary artery    Depression    hospitalization for suicide ideation   Essential hypertension, benign    History of CVA (cerebrovascular accident) 07/08/2018   5/20 right posterior frontal lacunar infarct with left-sided weakness, echo/CTA brain/neck normal Plavix /aspirin  started   History of stroke    Other and unspecified hyperlipidemia    Parkinson's disease (CMS/HHS-HCC)     Past Surgical History:  Procedure Laterality Date   COLONOSCOPY  04/30/1997   Hyperplastic Polyp   Heart cath procedure  11/02/1997   with stent   EGD  07/25/2005   COLONOSCOPY  12/11/2010   07/25/2005; Adenomatous Polyps, [facility] (Mother): [facility] 11/2015; [facility] mailed 10/10/2015 (dw)   Endoscopy procedure  04/2011   LA Grade B reflux esophagitis   EGD  05/10/2011   Esophagitis: No repeat per PYO   COLONOSCOPY  12/23/2015   PH Adenomatous Polyps, [facility] (Mother): [facility] 11/2020   EGD  12/23/2015   Gastritis, Esophagitis: No repeat per RTE   Back surgery     CHOLECYSTECTOMY     Hemorrhoid surgery      ALLERGIES: Allergies  Allergen Reactions   Imipramine Other (See Comments)    UNKNOWN ADVERSE REACTION(Information received from [facility]) UNKNOWN ADVERSE REACTION    CURRENT MEDICATIONS: Current Outpatient Medications  Medication Sig Dispense Refill   aspirin  81 MG chewable tablet Take 1 tablet (81 mg total) by mouth once daily 30 tablet 11   atorvastatin  (LIPITOR) 40 MG tablet Take 1 tablet (40 mg total) by mouth once daily 90 tablet 3   clopidogreL  (PLAVIX ) 75 mg tablet Take 1 tablet (75 mg total) by mouth once daily 90 tablet 3   hydroCHLOROthiazide (HYDRODIURIL) 12.5 MG tablet Take 1 tablet (12.5 mg total) by mouth once daily 90 tablet  3   losartan (COZAAR) 25 MG tablet Take 1 tablet (25 mg total) by mouth once daily 90 tablet 3   traZODone  (DESYREL ) 50 MG tablet Take 1 tablet (50 mg total) by mouth at bedtime 90 tablet 3   No current facility-administered medications for this visit.      HPI   CLINICAL SUMMARY:  Patient follows up post CVA.  Still has receptive and expressive aphasia but making some progress.  Right-sided strength is improving significantly.  No falls.  Stroke occurred on aspirin .  Depression a bit of an issue  ROS: Review of systems is unremarkable for any active cardiac, respiratory, GI, GU, hematologic, neurologic, dermatologic, HEENT, or psychiatric symptoms except as noted above, 10 systems reviewed.  No fevers, chills, or constitutional symptoms.   PHYSICAL EXAM  Vital signs:  BP 130/80   Pulse 70   Ht 175.3 cm (5' 9)   Wt 85.7 kg (189 lb)   SpO2 95%   BMI 27.91 kg/m  Body mass index is 27.91 kg/m.   Wt Readings from Last 3 Encounters:  02/12/24 85.7 kg (189 lb)  11/27/23 84.8 kg (187 lb)  10/07/23 86 kg (189 lb 9.5 oz)     BP Readings from Last 3 Encounters:  02/12/24 130/80  11/27/23 128/80  10/07/23 (!) 150/77    Constitutional:NAD Neck: supple, no thyromegaly, good ROM Respiratory:clear to auscultation, no rales or wheezes Cardiovascular:RRR, no murmur or gallop Abdominal:soft, good BS, NT Ext: no edema, good peripheral pulses Neuro: alert and oriented X 3, grossly nonfocal     ASSESSMENT/PLAN   Post CVA-significant improvement, right-sided strength dramatically improved, receptive expressive aphasia still a moderate issue.  Maintain aspirin  and Plavix , upcoming neurology appointment Hyperlipidemia-cholesterol 132 Depression-stable on trazodone   Challenged him to work on deep knee bends, short push-ups, getting out of the house 3 times a week, continue speech therapy   Dispo:   No follow-ups on file.     "

## 2024-02-13 ENCOUNTER — Ambulatory Visit

## 2024-02-18 ENCOUNTER — Ambulatory Visit

## 2024-02-25 ENCOUNTER — Ambulatory Visit

## 2024-03-03 ENCOUNTER — Ambulatory Visit

## 2024-03-03 ENCOUNTER — Ambulatory Visit: Payer: Medicare (Managed Care)

## 2024-03-05 ENCOUNTER — Ambulatory Visit: Payer: Medicare (Managed Care)

## 2024-03-05 ENCOUNTER — Ambulatory Visit

## 2024-03-10 ENCOUNTER — Ambulatory Visit: Payer: Medicare (Managed Care)

## 2024-03-10 ENCOUNTER — Ambulatory Visit

## 2024-03-12 ENCOUNTER — Ambulatory Visit: Payer: Medicare (Managed Care)

## 2024-03-12 ENCOUNTER — Ambulatory Visit

## 2024-03-24 ENCOUNTER — Ambulatory Visit: Payer: Medicare (Managed Care)

## 2024-03-31 ENCOUNTER — Ambulatory Visit: Payer: Medicare (Managed Care)

## 2024-03-31 DIAGNOSIS — R41841 Cognitive communication deficit: Secondary | ICD-10-CM

## 2024-03-31 DIAGNOSIS — R4701 Aphasia: Secondary | ICD-10-CM
